# Patient Record
Sex: Male | Born: 1970 | Race: White | Hispanic: No | Smoking: Former smoker
Health system: Southern US, Community
[De-identification: ages and names within clinical notes are randomized; demographics above are authoritative.]

## PROBLEM LIST (undated history)

## (undated) DIAGNOSIS — F329 Major depressive disorder, single episode, unspecified: Secondary | ICD-10-CM

## (undated) DIAGNOSIS — F419 Anxiety disorder, unspecified: Secondary | ICD-10-CM

## (undated) DIAGNOSIS — G8929 Other chronic pain: Secondary | ICD-10-CM

## (undated) DIAGNOSIS — I10 Essential (primary) hypertension: Secondary | ICD-10-CM

## (undated) DIAGNOSIS — M1990 Unspecified osteoarthritis, unspecified site: Secondary | ICD-10-CM

## (undated) DIAGNOSIS — M549 Dorsalgia, unspecified: Secondary | ICD-10-CM

## (undated) DIAGNOSIS — T4145XA Adverse effect of unspecified anesthetic, initial encounter: Secondary | ICD-10-CM

## (undated) DIAGNOSIS — E785 Hyperlipidemia, unspecified: Secondary | ICD-10-CM

## (undated) DIAGNOSIS — F32A Depression, unspecified: Secondary | ICD-10-CM

## (undated) DIAGNOSIS — T8859XA Other complications of anesthesia, initial encounter: Secondary | ICD-10-CM

## (undated) DIAGNOSIS — I499 Cardiac arrhythmia, unspecified: Secondary | ICD-10-CM

## (undated) DIAGNOSIS — K859 Acute pancreatitis without necrosis or infection, unspecified: Secondary | ICD-10-CM

## (undated) DIAGNOSIS — J189 Pneumonia, unspecified organism: Secondary | ICD-10-CM

## (undated) DIAGNOSIS — D759 Disease of blood and blood-forming organs, unspecified: Secondary | ICD-10-CM

## (undated) DIAGNOSIS — K219 Gastro-esophageal reflux disease without esophagitis: Secondary | ICD-10-CM

## (undated) DIAGNOSIS — D693 Immune thrombocytopenic purpura: Secondary | ICD-10-CM

## (undated) DIAGNOSIS — G4730 Sleep apnea, unspecified: Secondary | ICD-10-CM

## (undated) HISTORY — PX: BACK SURGERY: SHX140

## (undated) HISTORY — DX: Essential (primary) hypertension: I10

## (undated) HISTORY — PX: HERNIA REPAIR: SHX51

## (undated) HISTORY — PX: TONSILLECTOMY: SUR1361

## (undated) HISTORY — PX: APPENDECTOMY: SHX54

## (undated) HISTORY — PX: CHOLECYSTECTOMY: SHX55

## (undated) HISTORY — PX: DIAGNOSTIC LAPAROSCOPY: SUR761

## (undated) HISTORY — PX: VASECTOMY: SHX75

---

## 1996-08-08 HISTORY — PX: REFRACTIVE SURGERY: SHX103

## 2009-02-03 ENCOUNTER — Encounter: Admission: RE | Admit: 2009-02-03 | Discharge: 2009-02-03 | Payer: Self-pay | Admitting: Neurological Surgery

## 2009-06-25 ENCOUNTER — Ambulatory Visit: Payer: Self-pay | Admitting: Vascular Surgery

## 2009-08-19 ENCOUNTER — Ambulatory Visit: Payer: Self-pay | Admitting: Vascular Surgery

## 2009-08-19 ENCOUNTER — Inpatient Hospital Stay (HOSPITAL_COMMUNITY): Admission: RE | Admit: 2009-08-19 | Discharge: 2009-08-21 | Payer: Self-pay | Admitting: Neurological Surgery

## 2009-09-01 ENCOUNTER — Encounter: Admission: RE | Admit: 2009-09-01 | Discharge: 2009-09-01 | Payer: Self-pay | Admitting: Neurological Surgery

## 2009-11-10 ENCOUNTER — Encounter: Admission: RE | Admit: 2009-11-10 | Discharge: 2009-11-10 | Payer: Self-pay | Admitting: Neurological Surgery

## 2009-12-22 ENCOUNTER — Encounter: Admission: RE | Admit: 2009-12-22 | Discharge: 2009-12-22 | Payer: Self-pay | Admitting: Neurological Surgery

## 2010-04-05 ENCOUNTER — Encounter: Admission: RE | Admit: 2010-04-05 | Discharge: 2010-04-05 | Payer: Self-pay | Admitting: Neurological Surgery

## 2010-04-08 ENCOUNTER — Encounter: Admission: RE | Admit: 2010-04-08 | Discharge: 2010-04-08 | Payer: Self-pay | Admitting: Neurological Surgery

## 2010-08-29 ENCOUNTER — Encounter: Payer: Self-pay | Admitting: Neurological Surgery

## 2010-08-30 ENCOUNTER — Encounter: Payer: Self-pay | Admitting: Neurological Surgery

## 2010-10-24 LAB — TYPE AND SCREEN

## 2010-10-24 LAB — COMPREHENSIVE METABOLIC PANEL
ALT: 21 U/L (ref 0–53)
Alkaline Phosphatase: 92 U/L (ref 39–117)
BUN: 12 mg/dL (ref 6–23)
CO2: 25 mEq/L (ref 19–32)
Calcium: 9.9 mg/dL (ref 8.4–10.5)
Chloride: 105 mEq/L (ref 96–112)
Creatinine, Ser: 0.98 mg/dL (ref 0.4–1.5)
GFR calc Af Amer: >60 mL/min (ref >60)
GFR calc non Af Amer: >60 mL/min (ref >60)
Glucose, Bld: 93 mg/dL (ref 70–99)
Potassium: 4.2 mEq/L (ref 3.5–5.1)

## 2010-10-24 LAB — APTT: aPTT: 29 seconds (ref 24–37)

## 2010-10-24 LAB — DIFFERENTIAL
Eosinophils Absolute: 0.2 10*3/uL (ref 0.0–0.7)
Lymphs Abs: 3.8 10*3/uL (ref 0.7–4.0)
Neutro Abs: 5.3 10*3/uL (ref 1.7–7.7)

## 2010-10-24 LAB — CBC
MCHC: 35.2 g/dL (ref 30.0–36.0)
Platelets: 415 10*3/uL — ABNORMAL HIGH (ref 150–400)

## 2010-10-24 LAB — PROTIME-INR: INR: 1.06 (ref 0.00–1.49)

## 2010-10-24 LAB — ABO/RH: ABO/RH(D): B NEG

## 2010-12-20 ENCOUNTER — Ambulatory Visit
Admission: RE | Admit: 2010-12-20 | Discharge: 2010-12-20 | Disposition: A | Discharge status: Home / Self Care | Payer: BC Managed Care – PPO | Source: Ambulatory Visit | Attending: Neurological Surgery | Admitting: Neurological Surgery

## 2010-12-20 ENCOUNTER — Other Ambulatory Visit: Payer: Self-pay | Admitting: Neurological Surgery

## 2010-12-20 DIAGNOSIS — M48061 Spinal stenosis, lumbar region without neurogenic claudication: Secondary | ICD-10-CM

## 2010-12-20 DIAGNOSIS — IMO0002 Reserved for concepts with insufficient information to code with codable children: Secondary | ICD-10-CM

## 2010-12-21 NOTE — Consult Note (Signed)
VASCULAR SURGERY CONSULTATION   [name], [name]  DOB:  05/29/1971                                       06/25/2009  ZOXWR#:[id number]   I saw the patient in the office today to evaluate him for possible  anterior exposure of the L5-S1 disk for anterior lumbar interbody  fusion.  This is a pleasant 40 year old gentleman who injured his back  back in 1999.  He has had intermittent low back pain since that time  although over the last 18 months his symptoms have progressed  significantly.  His pain is aggravated by standing and alleviated by  sitting or elevating his legs.  There are no other aggravating or  alleviating factors.  He does have some radiation of his pain down the  posterior aspect of his lower extremities.  He has had some relief with  pain medication but continues to have significant low back pain.   The patient was being considered for either an anterior or posterior  approach for L5-S1 fusion and I was asked to evaluate the patient for  possible anterior exposure.   PAST MEDICAL HISTORY:  The patient's past medical history is significant  for hypertension and hypercholesterolemia which are both currently well-  controlled on his current medications.  He denies any history of  diabetes, history of previous myocardial infarction, history of  congestive heart failure or history of COPD.   PAST SURGICAL HISTORY:  Significant for previous appendectomy in 1975.  He had some scar tissue from his appendectomy incision which was a  midline incision and this was worked on in 1980.  He also had a left  inguinal hernia repair in 2000.   FAMILY HISTORY:  There is no history of premature cardiovascular  disease.   SOCIAL HISTORY:  He is married.  He has 3 children.  He works as a  Paediatric nurse.  He smokes a half to three quarters of a pack per day of  cigarettes.  He had quit for some time but recently started because he  has been having so much problem with his  back.   MEDICATIONS:  Are documented on the medical history form in his chart.   ALLERGIES:  Of note he is allergic to Wellbutrin and Nexium.   REVIEW OF SYSTEMS:  He has had an approximately 30 pound weight loss  over the last several months intentionally.  He has had no fever or  chills.  He is 257 pounds, 6 feet 1 inch tall.  CARDIAC:  He has had a history of atrial fibrillation in the past but  this was only for briefly.  He has had no further problems with this.  He has had no chest pain or chest pressure.  Pulmonary, GI, GU, vascular, neurologic, musculoskeletal, psychiatric,  ENT and hematologic review of systems is unremarkable and is documented  on the medical history form in his chart.   PHYSICAL EXAMINATION:  General:  This is a pleasant 40 year old  gentleman who appears his stated age.  Vital signs:  His blood pressure  is 115/75, heart rate is 75, temperature is 98.4.  HEENT:  Extraocular  motions are intact.  Conjunctivae are normal.  There is no facial  asymmetry.  Neck:  Supple.  There is no jugular venous distention and no  cervical lymphadenopathy.  Lungs:  Are clear bilaterally to auscultation  without rales, rhonchi or wheezing.  Cardiovascular:  I do not detect  any carotid bruits.  He has a regular rate and rhythm without murmur  appreciated.  He has no significant peripheral edema.  He has palpable  femoral, posterior tibial and dorsalis pedis pulses bilaterally.  Abdomen:  Moderately obese with a healed midline incision and a healed  left inguinal incision.  He has normal pitched bowel sounds.  Musculoskeletal:  There are no major deformities or cyanosis.  Neurological:  There is no focal weakness or paresthesias.  Skin:  There  are no ulcers or rashes.   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  I also  reviewed his diskogram which was positive at L5-S1 and essentially  normal at L3-L4 and L4-L5.  Post diskography CT of the lumbar spine   shows mild retro L5 vertebral sublux with some abutment of the right S1  nerve root and mild narrowing of the right foramen.   Although he has had previous abdominal surgery I think he would be a  reasonable candidate for anterior exposure for ALIF.  I think we would  be above the level of his hernia repair and also most of the scar tissue  from his appendectomy should not be an issue.  He is moderately obese  which does make it slightly more challenging but I do not think this is  prohibitive.  We have discussed the procedure and the potential  complications including but not limited to bleeding, arterial venous  injury, arterial venous thrombosis, nerve injury, leg swelling and the  small risk of sexual dysfunction.  All of his questions were answered  and he is scheduled to meet with Dr. Yetta Barre again.  From my standpoint,  however, I think he is a reasonable candidate for anterior exposure for  ALIF and would be happy to assist if this is what ultimately the patient  and Dr. Yetta Barre decide.   Di Kindle. Edilia Bo, M.D.  Electronically Signed  CSD/MEDQ  D:  06/25/2009  T:  06/26/2009  Job:  [id number]   cc:   Tia Alert, MD  Callie Fielding, M.D.  Dr Keturah Barre

## 2011-02-23 ENCOUNTER — Encounter (HOSPITAL_COMMUNITY)
Admission: RE | Admit: 2011-02-23 | Discharge: 2011-02-23 | Disposition: A | Discharge status: Home / Self Care | Payer: BC Managed Care – PPO | Source: Ambulatory Visit | Attending: Neurological Surgery | Admitting: Neurological Surgery

## 2011-02-23 ENCOUNTER — Other Ambulatory Visit (HOSPITAL_COMMUNITY): Payer: Self-pay | Admitting: Neurological Surgery

## 2011-02-23 DIAGNOSIS — Z01811 Encounter for preprocedural respiratory examination: Secondary | ICD-10-CM

## 2011-02-23 LAB — PROTIME-INR
INR: 1.11 (ref 0.00–1.49)
Prothrombin Time: 14.5 seconds (ref 11.6–15.2)

## 2011-02-23 LAB — CBC
MCH: 30.9 pg (ref 26.0–34.0)
MCHC: 35.7 g/dL (ref 30.0–36.0)
RDW: 12.8 % (ref 11.5–15.5)
WBC: 10.1 10*3/uL (ref 4.0–10.5)

## 2011-02-23 LAB — BASIC METABOLIC PANEL
Chloride: 106 mEq/L (ref 96–112)
GFR calc Af Amer: >60 mL/min (ref >60)
GFR calc non Af Amer: >60 mL/min (ref >60)
Potassium: 4.3 mEq/L (ref 3.5–5.1)

## 2011-02-23 LAB — SURGICAL PCR SCREEN: MRSA, PCR: NEGATIVE

## 2011-02-23 LAB — TYPE AND SCREEN: Antibody Screen: NEGATIVE

## 2011-02-24 ENCOUNTER — Inpatient Hospital Stay (HOSPITAL_COMMUNITY): Payer: BC Managed Care – PPO

## 2011-02-24 ENCOUNTER — Inpatient Hospital Stay (HOSPITAL_COMMUNITY)
Admission: RE | Admit: 2011-02-24 | Discharge: 2011-02-25 | DRG: 756 | Disposition: A | Discharge status: Home / Self Care | Payer: BC Managed Care – PPO | Source: Ambulatory Visit | Attending: Neurological Surgery | Admitting: Neurological Surgery

## 2011-02-24 DIAGNOSIS — M51379 Other intervertebral disc degeneration, lumbosacral region without mention of lumbar back pain or lower extremity pain: Principal | ICD-10-CM | POA: Diagnosis present

## 2011-02-24 DIAGNOSIS — F17200 Nicotine dependence, unspecified, uncomplicated: Secondary | ICD-10-CM | POA: Diagnosis present

## 2011-02-24 DIAGNOSIS — M5137 Other intervertebral disc degeneration, lumbosacral region: Principal | ICD-10-CM | POA: Diagnosis present

## 2011-02-24 DIAGNOSIS — I10 Essential (primary) hypertension: Secondary | ICD-10-CM | POA: Diagnosis present

## 2011-02-24 DIAGNOSIS — G4733 Obstructive sleep apnea (adult) (pediatric): Secondary | ICD-10-CM | POA: Diagnosis present

## 2011-02-24 DIAGNOSIS — K219 Gastro-esophageal reflux disease without esophagitis: Secondary | ICD-10-CM | POA: Diagnosis present

## 2011-02-24 DIAGNOSIS — E669 Obesity, unspecified: Secondary | ICD-10-CM | POA: Diagnosis present

## 2011-03-03 ENCOUNTER — Ambulatory Visit (INDEPENDENT_AMBULATORY_CARE_PROVIDER_SITE_OTHER): Payer: BC Managed Care – PPO | Admitting: General Surgery

## 2011-03-04 NOTE — Op Note (Signed)
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  PROCEDURE: 1. Anterolateral retroperitoneal interbody fusion L4-5 utilizing a 12     x 22 x 55 mm Peak interbody cage packed with Osteocel Plus and     Actifuse putty. 2. Posterior interlaminar arthrodesis L4-S1 utilizing Actifuse putty     and Osteocel Plus. 3. Posterior nonsegmental fixation utilizing a large fix plate.  SURGEON:  Tia Alert, MD  ASSISTANT:  Reinaldo Meeker, MD  ANESTHESIA:  General endotracheal.  COMPLICATIONS:  None apparent.  INDICATIONS FOR PROCEDURE:  Mr. [name] is a 39 year old gentleman who presented with severe back pain.  He had undergone a previous L5-S1 ALIF.  He had return of his pain.  He had a provocative diskogram which suggested severe concordant pain at L4-5 with a normal controls.  I felt this made him a candidate for lumbar fusion at L4-5.  I recommended anterolateral retroperitoneal interbody fusion and hopes of improving his pain syndrome.  He understood the risks, benefits, unexpected outcome, and wished to proceed.  DESCRIPTION OF PROCEDURE:  The patient was taken to operating room. After induction of adequate generalized endotracheal anesthesia, he was placed in the right lateral decubitus position exposing his left side and taped into position and typical anterolateral retroperitoneal interbody fusion in position.  He was positioned utilizing AP and lateral fluoroscopy.  Lateral incision was marked utilizing lateral fluoroscopy.  The skin was  then cleaned with Hibiclens and prepped with DuraPrep and then draped in usual sterile fashion.  A 5 mL of local anesthesia injected and a direct lateral incision was made over the L4-5 interspace.  An incision was made posterior lateral to this and blunt finger dissection was used to enter the retroperitoneal space at the L3- 4 and L4-5 level.  The psoas musculature was palpable.  I could feel the rib end and inside of the iliac crest.  I could feel the psoas musculature as well as the anterior face of the transverse process and I swept my finger to the lateral incision and passed my first dilator down to the psoas muscle.  We checked our twitch test and then checked lateral fluoroscopy, we were over the interspace at L4-5.  We checked EMG monitoring to make sure we were not close to neural structures and placed our K-wire.  We then used sequential dilation until our final retractor was in place.  We then opened the retractor, checked with AP and lateral fluoroscopy.  Fluoroscopy used, ball probe to assure no neural structures in the surgical bed and then placed our intradiscal shim.  Once this was in place, we able to open the retractor further, use of all probe once again.  We incised the disk  space and performed initial diskectomy with pituitary rongeurs.  I then used Cobb curettes to release disk from the endplates and released the opposite annulus. Once this was done, I used scrapers and shavers to prepare the endplates for later arthrodesis.  I then passed 60-mm paddle across the endplate open the opposite annulus further and then used sequential trials.  The 12-mm lordotic trial fit the best.  Therefore, we picked a 12 lordotic x 22 mm x 55 mm Peak interbody cage in packed this with Osteocel Plus and Actifuse putty and then used sliders to tap it across the interspace utilizing AP fluoroscopy.  I then checked my final construct with AP and lateral fluoroscopy and irrigated with  saline solution containing bacitracin.  I removed the retractor and checking for any bleeding and then closed the incisions with 0 Vicryl in the fascia, 2-0 Vicryl subcutaneous tissues, 3-0 Vicryl subcuticular tissues, and Dermabond on the skin.  The patient was then repositioned on chest rolls into the prone position.  His lumbar region was prepped with DuraPrep and then draped in usual sterile fashion.  A 5 mL local anesthesia injected, a dorsal midline incision and carried down to the fascia.  The fascia was opened and the paraspinous musculature taken down subperiosteal fashion to expose L4-5 and L5-S1.  Intraoperative fluoroscopy confirmed my level, I removed the interspinous ligament at L4-5.  I measured to be a large plate.  I drilled the lamina of L4-5 and S1, and then placed a mixture of Osteocel Plus and Actifuse putty out over these.  I performed posterior laminar arthrodesis and then placed my large plate at Z6-1, squeeze it into position.  I then checked my final construct, the AP and lateral fluoroscopy, irrigated with saline solution containing bacitracin, placed a medium Hemovac drain through a separate stab incision and closed the fascia with 0 Vicryl and subcuticular tissue with 3-0 Vicryl, closed the skin with Benzoin and Steri-Strips.  The drapes were removed.  Sterile dressing was applied.  The patient was awakened from general anesthesia and transferred to recovery room in stable condition.  At the end of procedure, all sponge, needle, and sponge counts were correct.     Tia Alert, MD     DSJ/MEDQ  D:  02/24/2011  T:  02/25/2011  Job:  [id number]  Electronically Signed by David Alar MD on 03/04/2011 11:36:31 AM

## 2011-03-28 ENCOUNTER — Other Ambulatory Visit: Payer: Self-pay | Admitting: Neurological Surgery

## 2011-03-28 ENCOUNTER — Ambulatory Visit
Admission: RE | Admit: 2011-03-28 | Discharge: 2011-03-28 | Disposition: A | Discharge status: Home / Self Care | Payer: BC Managed Care – PPO | Source: Ambulatory Visit | Attending: Neurological Surgery | Admitting: Neurological Surgery

## 2011-03-28 DIAGNOSIS — M549 Dorsalgia, unspecified: Secondary | ICD-10-CM

## 2011-07-12 ENCOUNTER — Ambulatory Visit
Admission: RE | Admit: 2011-07-12 | Discharge: 2011-07-12 | Disposition: A | Discharge status: Home / Self Care | Payer: BC Managed Care – PPO | Source: Ambulatory Visit | Attending: Neurological Surgery | Admitting: Neurological Surgery

## 2011-07-12 ENCOUNTER — Other Ambulatory Visit: Payer: Self-pay | Admitting: Neurological Surgery

## 2011-07-12 DIAGNOSIS — M545 Low back pain, unspecified: Secondary | ICD-10-CM

## 2011-07-12 DIAGNOSIS — M79604 Pain in right leg: Secondary | ICD-10-CM

## 2011-12-20 ENCOUNTER — Other Ambulatory Visit: Payer: Self-pay | Admitting: Physician Assistant

## 2011-12-20 ENCOUNTER — Ambulatory Visit
Admission: RE | Admit: 2011-12-20 | Discharge: 2011-12-20 | Disposition: A | Discharge status: Home / Self Care | Payer: BC Managed Care – PPO | Source: Ambulatory Visit | Attending: Physician Assistant | Admitting: Physician Assistant

## 2011-12-20 DIAGNOSIS — R52 Pain, unspecified: Secondary | ICD-10-CM

## 2012-07-12 ENCOUNTER — Encounter (HOSPITAL_COMMUNITY): Payer: Self-pay | Admitting: Pharmacy Technician

## 2012-07-16 NOTE — H&P (Addendum)
History of Present Illness The patient is a 41 year old male who presents with back pain. The patient is here today in referral from Dr. Vear Clock. The patient reports low back symptoms including pain which began 5 year(s) ago following a specific injury. The injury occurred due to lifting (lifting and pulling out of the ground an object with low back pain (1998)) and bending. and Symptoms include pain (across the lower lumbar region radiating into bilat. lower ext. with left greater than the right, to the level of the feet. ), numbness (bilat. lower ext. ) and weakness (left greater than right ), while symptoms do not include incontinence of stool or incontinence of urine. Current treatment includes nonsteroidal anti-inflammatory drugs (Celebrex 200mg  bid ), opioid analgesics (Oxycodone 20mg , Lyrica 150mg  tid), muscle relaxants (Robaxin 750mg  as well as Valium ), activity modification, heating pad and TENS unit. Prior to being seen today the patient was previously evaluated Dr. Yetta Barre Neurosurgeon as well as Dr. Vear Clock . Past evaluation has included x-ray of the lumbar spine, CT of the lumbar spine, MRI of the lumbar spine, neurosurgical evaluation and pain management evaluation. Past treatment has included opioid analgesics, muscle relaxants, epidural injections and back surgery (X2).    Subjective Transcription  [name]'s a very pleasant, young man, who has been having chronic back pain. He has a diagnosis of failed back syndrome. He's had 2 fusions in the past and, despite this, he continues to have severe, disabling pain. Dr. Vear Clock had attempted to do a spinal cord stimulator, but he was unable, because of scar tissue to pass the leads. As a result of the failure of the trial, he was sent to me to determine if we can do a permanent implant trial.    Allergies NexIUM *ULCER DRUGS* Wellbutrin *ANTIDEPRESSANTS*   Family History Congestive Heart Failure. mother Diabetes  Mellitus. mother Heart Disease. mother Heart disease in male family member before age 7 Hypertension. mother and father   Social History Alcohol use. never consumed alcohol Children. 3 Current work status. unemployed Drug/Alcohol Rehab (Currently). no Drug/Alcohol Rehab (Previously). no Exercise. Exercises daily; does running / walking Illicit drug use. no Living situation. live with spouse Marital status. married Number of flights of stairs before winded. 2-3 Pain Contract. yes Tobacco / smoke exposure. no Tobacco use. former smoker   Medication History Robaxin-750 ( Oral) Specific dose unknown - Active. Lyrica ( Oral) Specific dose unknown - Active. OxyCODONE HCl ( Oral) Specific dose unknown - Active. CeleBREX ( Oral) Specific dose unknown - Active. Verelan PM ( Oral) Specific dose unknown - Active. PROzac ( Oral) Specific dose unknown - Active. Lisinopril ( Oral) Specific dose unknown - Active. Tricor ( Oral) Specific dose unknown - Active. Livalo ( Oral) Specific dose unknown - Active.   Past Surgical History Appendectomy Gallbladder Surgery. laporoscopic Inguinal Hernia Repair. open: left Spinal Fusion. lower back Spinal Surgery Tonsillectomy Vasectomy   Other Problems Chronic Pain Depression Gastroesophageal Reflux Disease High blood pressure Hypercholesterolemia   Review of Systems General:Not Present- Chills, Fever, Night Sweats, Appetite Loss, Fatigue, Feeling sick, Weight Gain and Weight Loss. Skin:Not Present- Itching, Rash, Skin Color Changes, Ulcer, Psoriasis and Change in Hair or Nails. HEENT:Present- Ringing in the Ears. Not Present- Sensitivity to light, Hearing problems and Nose Bleed. Neck:Not Present- Swollen Glands and Neck Mass. Respiratory:Not Present- Snoring, Chronic Cough, Bloody sputum and Dyspnea. Cardiovascular:Not Present- Shortness of Breath, Chest Pain, Swelling of Extremities, Leg Cramps  and Palpitations. Gastrointestinal:Present- Heartburn. Not Present- Bloody Stool, Abdominal  Pain, Vomiting, Nausea and Incontinence of Stool. Male Genitourinary:Not Present- Blood in Urine, Frequency, Incontinence and Nocturia. Musculoskeletal:Present- Muscle Weakness, Muscle Pain, Joint Stiffness, Joint Swelling, Joint Pain and Back Pain. Neurological:Present- Tingling, Numbness and Burning. Not Present- Tremor, Headaches and Dizziness. Psychiatric:Present- Depression. Not Present- Anxiety and Memory Loss. Hematology:Not Present- Abnormal Bleeding, Anemia, Blood Clots and Easy Bruising.   Vitals 06/29/2012 2:33 PM Weight: 310 lb Height: 72 in Body Surface Area: 2.67 m Body Mass Index: 42.04 kg/m Pulse: 76 (Regular) BP: 131/78 (Sitting, Left Arm, Standard)    Objective Transcription  On clinical examination, he's a pleasant gentleman, who appears his stated age. He's alert. He's oriented times 3. No shortness of breath or chest pain.    LUNGS: Lung fields are clear to auscultation.  HEART: Regular rate and rhythm.  ABDOMEN: Soft and nontender.  GU: No incontinence of bowel or bladder.    He has 5/5 strength in his lower extremity. No focal motor deficits. He does have bilateral numbness and dysesthesias in both extremities. No hip, knee or ankle pain with joint ROM. He ambulates with a cane. Compartments are soft and nontender. Intact peripheral pulses.    Assessment & Plan Pain, Lumbar (LBP) (724.2)  Chronic pain syndrome (338.4) Current Plans l MRI Thoracic Spine (40981) (STAT; Thoracic MRI eval. for SCS placement ; Not chlaus.; No metal)   Plans Transcription  At this point in time, I do think it is reasonable to do an open trial. This would entail making an incision at the lower thoracic spine, creating a laminotomy defect, and advancing a paddle and securing it to the spinous process. I would then advance it  down to the mid lumbar region and then make a separate incision. In that incision, I would bury the wire after connecting it to an extension and then advance the extensions out from the skin to connect to his trial battery. If this operation is successful in controlling and reducing his pain, I would then go back in, remove the extension and then advance the original electrodes down to the lower gluteal region and connect it to a battery and use the battery as a permanent implant. Another option is to just do a straightforward spinal cord stimulator implant with the battery. I will check with his insurance company to see which they would require.    At this point, I've explained the risks which include infection, bleeding nerve damage, death, stroke, paralysis, failure to heal, ongoing or worse pain, migration of the leads. All of the patient's and the wife's questions were addressed. They were present for the dictation. I will need to get a STAT thoracic MRI to ensure that there is adequate room to accommodate the implant. I will get that. We will also get preoperative medical clearance from his PCP. Once I have this information, we will plan on proceeding in a very timely fashion. The patient indicated because of financial reasons he would like to get all this done before the end of the year which I think is possible.      Venita Lick, M. D./slk    fx: Kathrin Penner. Katheran Awe. D./slk  fx: Keturah Barre, M. D./slk   Add: patient was cleared for surgery by PCP Dr Keturah Barre

## 2012-07-17 ENCOUNTER — Encounter (HOSPITAL_COMMUNITY)
Admission: RE | Admit: 2012-07-17 | Discharge: 2012-07-17 | Disposition: A | Discharge status: Home / Self Care | Payer: BC Managed Care – PPO | Source: Ambulatory Visit | Attending: Orthopedic Surgery | Admitting: Orthopedic Surgery

## 2012-07-17 ENCOUNTER — Encounter (HOSPITAL_COMMUNITY): Payer: Self-pay

## 2012-07-17 ENCOUNTER — Ambulatory Visit (HOSPITAL_COMMUNITY)
Admission: RE | Admit: 2012-07-17 | Discharge: 2012-07-17 | Disposition: A | Discharge status: Home / Self Care | Payer: BC Managed Care – PPO | Source: Ambulatory Visit | Attending: Anesthesiology | Admitting: Anesthesiology

## 2012-07-17 DIAGNOSIS — Z01818 Encounter for other preprocedural examination: Secondary | ICD-10-CM | POA: Insufficient documentation

## 2012-07-17 DIAGNOSIS — I10 Essential (primary) hypertension: Secondary | ICD-10-CM | POA: Insufficient documentation

## 2012-07-17 HISTORY — DX: Sleep apnea, unspecified: G47.30

## 2012-07-17 HISTORY — DX: Pneumonia, unspecified organism: J18.9

## 2012-07-17 HISTORY — DX: Acute pancreatitis without necrosis or infection, unspecified: K85.90

## 2012-07-17 HISTORY — DX: Depression, unspecified: F32.A

## 2012-07-17 HISTORY — DX: Gastro-esophageal reflux disease without esophagitis: K21.9

## 2012-07-17 HISTORY — DX: Hyperlipidemia, unspecified: E78.5

## 2012-07-17 HISTORY — DX: Essential (primary) hypertension: I10

## 2012-07-17 HISTORY — DX: Cardiac arrhythmia, unspecified: I49.9

## 2012-07-17 HISTORY — DX: Unspecified osteoarthritis, unspecified site: M19.90

## 2012-07-17 HISTORY — DX: Major depressive disorder, single episode, unspecified: F32.9

## 2012-07-17 LAB — SURGICAL PCR SCREEN: Staphylococcus aureus: NEGATIVE

## 2012-07-17 LAB — CBC
HCT: 44.1 % (ref 39.0–52.0)
MCHC: 36.5 g/dL — ABNORMAL HIGH (ref 30.0–36.0)
RDW: 12.6 % (ref 11.5–15.5)

## 2012-07-17 LAB — BASIC METABOLIC PANEL
BUN: 12 mg/dL (ref 6–23)
Chloride: 100 mEq/L (ref 96–112)
Creatinine, Ser: 0.83 mg/dL (ref 0.50–1.35)
GFR calc Af Amer: >90 mL/min (ref >90)

## 2012-07-17 NOTE — Progress Notes (Signed)
Cardiac records and sleep study received and discussed with Revonda Standard, ok for surgery.

## 2012-07-17 NOTE — Pre-Procedure Instructions (Addendum)
20 [name]  07/17/2012   Your procedure is scheduled on:  Wednesday July 18, 2012  Report to [facility] at 11:30 AM.  Call this number if you have problems the morning of surgery: [phone]   Remember:   Do not eat food or drink :After Midnight.    Take these medicines the morning of surgery with A SIP OF WATER: valium, oxycodone, lyrica,    Do not wear jewelry, make-up or nail polish.  Do not wear lotions, powders, or perfumes.   Do not shave 48 hours prior to surgery. Men may shave face and neck.  Do not bring valuables to the hospital.  Contacts, dentures or bridgework may not be worn into surgery.  Leave suitcase in the car. After surgery it may be brought to your room.  For patients admitted to the hospital, checkout time is 11:00 AM the day of discharge.   Patients discharged the day of surgery will not be allowed to drive home.  Name and phone number of your driver: family / friend  Special Instructions: Shower using CHG 2 nights before surgery and the night before surgery.  If you shower the day of surgery use CHG.  Use special wash - you have one bottle of CHG for all showers.  You should use approximately 1/3 of the bottle for each shower.   Please read over the following fact sheets that you were given: Pain Booklet, Coughing and Deep Breathing, MRSA Information and Surgical Site Infection Prevention

## 2012-07-17 NOTE — Progress Notes (Signed)
Contacted Dr. Hulen Shouts office, spoke with Hansel Starling, requested last office visit note, stress/echo, and EKG. Faxed request to [facility] medical records requesting sleep study.

## 2012-07-18 ENCOUNTER — Ambulatory Visit (HOSPITAL_COMMUNITY): Payer: BC Managed Care – PPO | Admitting: Anesthesiology

## 2012-07-18 ENCOUNTER — Ambulatory Visit (HOSPITAL_COMMUNITY)
Admission: RE | Admit: 2012-07-18 | Discharge: 2012-07-19 | Disposition: A | Discharge status: Home / Self Care | Payer: BC Managed Care – PPO | Source: Ambulatory Visit | Attending: Orthopedic Surgery | Admitting: Orthopedic Surgery

## 2012-07-18 ENCOUNTER — Encounter (HOSPITAL_COMMUNITY)
Admission: RE | Disposition: A | Discharge status: Home / Self Care | Payer: Self-pay | Source: Ambulatory Visit | Attending: Orthopedic Surgery

## 2012-07-18 ENCOUNTER — Encounter (HOSPITAL_COMMUNITY): Payer: Self-pay | Admitting: Anesthesiology

## 2012-07-18 ENCOUNTER — Ambulatory Visit (HOSPITAL_COMMUNITY): Payer: BC Managed Care – PPO

## 2012-07-18 ENCOUNTER — Encounter (HOSPITAL_COMMUNITY): Payer: Self-pay | Admitting: *Deleted

## 2012-07-18 DIAGNOSIS — Z01818 Encounter for other preprocedural examination: Secondary | ICD-10-CM | POA: Insufficient documentation

## 2012-07-18 DIAGNOSIS — Z79899 Other long term (current) drug therapy: Secondary | ICD-10-CM | POA: Insufficient documentation

## 2012-07-18 DIAGNOSIS — G4730 Sleep apnea, unspecified: Secondary | ICD-10-CM | POA: Insufficient documentation

## 2012-07-18 DIAGNOSIS — G8929 Other chronic pain: Secondary | ICD-10-CM | POA: Insufficient documentation

## 2012-07-18 DIAGNOSIS — Z01810 Encounter for preprocedural cardiovascular examination: Secondary | ICD-10-CM | POA: Insufficient documentation

## 2012-07-18 DIAGNOSIS — F3289 Other specified depressive episodes: Secondary | ICD-10-CM | POA: Insufficient documentation

## 2012-07-18 DIAGNOSIS — I4891 Unspecified atrial fibrillation: Secondary | ICD-10-CM | POA: Insufficient documentation

## 2012-07-18 DIAGNOSIS — M549 Dorsalgia, unspecified: Secondary | ICD-10-CM | POA: Insufficient documentation

## 2012-07-18 DIAGNOSIS — K219 Gastro-esophageal reflux disease without esophagitis: Secondary | ICD-10-CM | POA: Insufficient documentation

## 2012-07-18 DIAGNOSIS — Z981 Arthrodesis status: Secondary | ICD-10-CM | POA: Insufficient documentation

## 2012-07-18 DIAGNOSIS — E785 Hyperlipidemia, unspecified: Secondary | ICD-10-CM | POA: Insufficient documentation

## 2012-07-18 DIAGNOSIS — I10 Essential (primary) hypertension: Secondary | ICD-10-CM | POA: Insufficient documentation

## 2012-07-18 DIAGNOSIS — F329 Major depressive disorder, single episode, unspecified: Secondary | ICD-10-CM | POA: Insufficient documentation

## 2012-07-18 DIAGNOSIS — Z01812 Encounter for preprocedural laboratory examination: Secondary | ICD-10-CM | POA: Insufficient documentation

## 2012-07-18 HISTORY — PX: SPINAL CORD STIMULATOR INSERTION: SHX5378

## 2012-07-18 HISTORY — PX: SPINAL CORD STIMULATOR IMPLANT: SHX2422

## 2012-07-18 SURGERY — INSERTION, SPINAL CORD STIMULATOR, LUMBAR
Anesthesia: General | Site: Back | Wound class: Clean

## 2012-07-18 MED ORDER — PREGABALIN 50 MG PO CAPS
150.0000 mg | ORAL_CAPSULE | Freq: Three times a day (TID) | ORAL | Status: DC
  Administered 2012-07-18 – 2012-07-19 (×2): 150 mg via ORAL
  Filled 2012-07-18 (×2): qty 3

## 2012-07-18 MED ORDER — DEXAMETHASONE SODIUM PHOSPHATE 4 MG/ML IJ SOLN
4.0000 mg | Freq: Four times a day (QID) | INTRAMUSCULAR | Status: DC
  Filled 2012-07-18 (×7): qty 1

## 2012-07-18 MED ORDER — PHENYLEPHRINE HCL 10 MG/ML IJ SOLN
INTRAMUSCULAR | Status: DC | PRN
  Administered 2012-07-18: 80 ug via INTRAVENOUS
  Administered 2012-07-18 (×2): 40 ug via INTRAVENOUS
  Administered 2012-07-18 (×2): 80 ug via INTRAVENOUS
  Administered 2012-07-18: 40 ug via INTRAVENOUS
  Administered 2012-07-18: 80 ug via INTRAVENOUS

## 2012-07-18 MED ORDER — LACTATED RINGERS IV SOLN
INTRAVENOUS | Status: DC | PRN
  Administered 2012-07-18 (×3): via INTRAVENOUS

## 2012-07-18 MED ORDER — THROMBIN 20000 UNITS EX SOLR
CUTANEOUS | Status: AC
  Filled 2012-07-18: qty 20000

## 2012-07-18 MED ORDER — PHENOL 1.4 % MT LIQD: 1.0000 | OROMUCOSAL | Status: DC | PRN

## 2012-07-18 MED ORDER — SUCCINYLCHOLINE CHLORIDE 20 MG/ML IJ SOLN
INTRAMUSCULAR | Status: DC | PRN
  Administered 2012-07-18: 100 mg via INTRAVENOUS

## 2012-07-18 MED ORDER — BUPIVACAINE-EPINEPHRINE 0.25% -1:200000 IJ SOLN
INTRAMUSCULAR | Status: DC | PRN
  Administered 2012-07-18: 10 mL

## 2012-07-18 MED ORDER — PREGABALIN 75 MG PO CAPS: 150.0000 mg | ORAL_CAPSULE | Freq: Three times a day (TID) | ORAL | Status: DC

## 2012-07-18 MED ORDER — MORPHINE SULFATE 2 MG/ML IJ SOLN
1.0000 mg | INTRAMUSCULAR | Status: DC | PRN
  Administered 2012-07-18 – 2012-07-19 (×3): 2 mg via INTRAVENOUS
  Filled 2012-07-18 (×3): qty 1

## 2012-07-18 MED ORDER — THROMBIN 20000 UNITS EX SOLR
OROMUCOSAL | Status: DC | PRN
  Administered 2012-07-18: 14:00:00 via TOPICAL

## 2012-07-18 MED ORDER — OXYCODONE HCL 5 MG/5ML PO SOLN: 5.0000 mg | Freq: Once | ORAL | Status: DC | PRN

## 2012-07-18 MED ORDER — ROCURONIUM BROMIDE 100 MG/10ML IV SOLN
INTRAVENOUS | Status: DC | PRN
  Administered 2012-07-18: 40 mg via INTRAVENOUS
  Administered 2012-07-18: 10 mg via INTRAVENOUS

## 2012-07-18 MED ORDER — LACTATED RINGERS IV SOLN
INTRAVENOUS | Status: DC
  Administered 2012-07-18: 13:00:00 via INTRAVENOUS

## 2012-07-18 MED ORDER — ONDANSETRON HCL 4 MG/2ML IJ SOLN: 4.0000 mg | Freq: Four times a day (QID) | INTRAMUSCULAR | Status: DC | PRN

## 2012-07-18 MED ORDER — OXYCODONE HCL 5 MG PO TABS
ORAL_TABLET | ORAL | Status: AC
  Filled 2012-07-18: qty 4

## 2012-07-18 MED ORDER — ACETAMINOPHEN 10 MG/ML IV SOLN: 1000.0000 mg | Freq: Once | INTRAVENOUS | Status: DC

## 2012-07-18 MED ORDER — ZOLPIDEM TARTRATE 5 MG PO TABS: 5.0000 mg | ORAL_TABLET | Freq: Every evening | ORAL | Status: DC | PRN

## 2012-07-18 MED ORDER — PROPOFOL 10 MG/ML IV BOLUS
INTRAVENOUS | Status: DC | PRN
  Administered 2012-07-18: 20 mg via INTRAVENOUS
  Administered 2012-07-18: 200 mg via INTRAVENOUS
  Administered 2012-07-18: 20 mg via INTRAVENOUS

## 2012-07-18 MED ORDER — DIAZEPAM 5 MG PO TABS: 5.0000 mg | ORAL_TABLET | Freq: Two times a day (BID) | ORAL | Status: DC | PRN

## 2012-07-18 MED ORDER — SODIUM CHLORIDE 0.9 % IJ SOLN
3.0000 mL | Freq: Two times a day (BID) | INTRAMUSCULAR | Status: DC
  Administered 2012-07-18: 3 mL via INTRAVENOUS

## 2012-07-18 MED ORDER — MIDAZOLAM HCL 5 MG/5ML IJ SOLN
INTRAMUSCULAR | Status: DC | PRN
  Administered 2012-07-18 (×2): 2 mg via INTRAVENOUS

## 2012-07-18 MED ORDER — SODIUM CHLORIDE 0.9 % IJ SOLN: 3.0000 mL | INTRAMUSCULAR | Status: DC | PRN

## 2012-07-18 MED ORDER — HEMOSTATIC AGENTS (NO CHARGE) OPTIME
TOPICAL | Status: DC | PRN
  Administered 2012-07-18: 1 via TOPICAL

## 2012-07-18 MED ORDER — METHOCARBAMOL 500 MG PO TABS
ORAL_TABLET | ORAL | Status: AC
  Filled 2012-07-18: qty 2

## 2012-07-18 MED ORDER — OXYCODONE HCL 5 MG PO TABS: 5.0000 mg | ORAL_TABLET | Freq: Once | ORAL | Status: DC | PRN

## 2012-07-18 MED ORDER — VERAPAMIL HCL ER 180 MG PO TBCR
300.0000 mg | EXTENDED_RELEASE_TABLET | Freq: Every day | ORAL | Status: DC
  Administered 2012-07-18: 300 mg via ORAL
  Filled 2012-07-18 (×2): qty 1

## 2012-07-18 MED ORDER — NEOSTIGMINE METHYLSULFATE 1 MG/ML IJ SOLN
INTRAMUSCULAR | Status: DC | PRN
  Administered 2012-07-18: 3 mg via INTRAVENOUS

## 2012-07-18 MED ORDER — FENTANYL CITRATE 0.05 MG/ML IJ SOLN
INTRAMUSCULAR | Status: DC | PRN
  Administered 2012-07-18: 100 ug via INTRAVENOUS
  Administered 2012-07-18 (×2): 50 ug via INTRAVENOUS
  Administered 2012-07-18: 100 ug via INTRAVENOUS
  Administered 2012-07-18: 50 ug via INTRAVENOUS
  Administered 2012-07-18: 150 ug via INTRAVENOUS

## 2012-07-18 MED ORDER — CEFAZOLIN SODIUM 1-5 GM-% IV SOLN
1.0000 g | Freq: Three times a day (TID) | INTRAVENOUS | Status: AC
  Administered 2012-07-18 – 2012-07-19 (×2): 1 g via INTRAVENOUS
  Filled 2012-07-18 (×2): qty 50

## 2012-07-18 MED ORDER — CEFAZOLIN SODIUM-DEXTROSE 2-3 GM-% IV SOLR
INTRAVENOUS | Status: DC | PRN
  Administered 2012-07-18: 2 g via INTRAVENOUS

## 2012-07-18 MED ORDER — LACTATED RINGERS IV SOLN
INTRAVENOUS | Status: DC
  Administered 2012-07-18: 17:00:00 via INTRAVENOUS

## 2012-07-18 MED ORDER — OXYCODONE HCL 5 MG PO TABS
20.0000 mg | ORAL_TABLET | Freq: Four times a day (QID) | ORAL | Status: DC | PRN
  Administered 2012-07-18 – 2012-07-19 (×4): 20 mg via ORAL
  Filled 2012-07-18 (×3): qty 4

## 2012-07-18 MED ORDER — HYDROMORPHONE HCL PF 1 MG/ML IJ SOLN
INTRAMUSCULAR | Status: DC | PRN
  Administered 2012-07-18: 1 mg via INTRAVENOUS

## 2012-07-18 MED ORDER — CEFAZOLIN SODIUM-DEXTROSE 2-3 GM-% IV SOLR
2.0000 g | INTRAVENOUS | Status: DC
  Filled 2012-07-18: qty 50

## 2012-07-18 MED ORDER — HYDROMORPHONE HCL PF 1 MG/ML IJ SOLN: 0.2500 mg | INTRAMUSCULAR | Status: DC | PRN

## 2012-07-18 MED ORDER — METHOCARBAMOL 750 MG PO TABS
750.0000 mg | ORAL_TABLET | Freq: Three times a day (TID) | ORAL | Status: DC | PRN
  Administered 2012-07-18 – 2012-07-19 (×2): 750 mg via ORAL
  Filled 2012-07-18 (×2): qty 1

## 2012-07-18 MED ORDER — LISINOPRIL 20 MG PO TABS
20.0000 mg | ORAL_TABLET | Freq: Every day | ORAL | Status: DC
  Administered 2012-07-18: 20 mg via ORAL
  Filled 2012-07-18 (×2): qty 1

## 2012-07-18 MED ORDER — ACETAMINOPHEN 10 MG/ML IV SOLN
1000.0000 mg | Freq: Four times a day (QID) | INTRAVENOUS | Status: DC
  Administered 2012-07-18 – 2012-07-19 (×3): 1000 mg via INTRAVENOUS
  Filled 2012-07-18 (×4): qty 100

## 2012-07-18 MED ORDER — ONDANSETRON HCL 4 MG/2ML IJ SOLN
INTRAMUSCULAR | Status: DC | PRN
  Administered 2012-07-18: 4 mg via INTRAVENOUS

## 2012-07-18 MED ORDER — FLUOXETINE HCL 20 MG PO CAPS
60.0000 mg | ORAL_CAPSULE | Freq: Every day | ORAL | Status: DC
  Administered 2012-07-18: 60 mg via ORAL
  Filled 2012-07-18 (×2): qty 3

## 2012-07-18 MED ORDER — VERAPAMIL HCL ER 300 MG PO CP24: 300.0000 mg | ORAL_CAPSULE | Freq: Every day | ORAL | Status: DC

## 2012-07-18 MED ORDER — DEXAMETHASONE 4 MG PO TABS
4.0000 mg | ORAL_TABLET | Freq: Four times a day (QID) | ORAL | Status: DC
  Administered 2012-07-18 – 2012-07-19 (×3): 4 mg via ORAL
  Filled 2012-07-18 (×7): qty 1

## 2012-07-18 MED ORDER — MENTHOL 3 MG MT LOZG: 1.0000 | LOZENGE | OROMUCOSAL | Status: DC | PRN

## 2012-07-18 MED ORDER — ATORVASTATIN CALCIUM 10 MG PO TABS
10.0000 mg | ORAL_TABLET | Freq: Every day | ORAL | Status: DC
  Administered 2012-07-18: 10 mg via ORAL
  Filled 2012-07-18 (×2): qty 1

## 2012-07-18 MED ORDER — SODIUM CHLORIDE 0.9 % IV SOLN: 250.0000 mL | INTRAVENOUS | Status: DC

## 2012-07-18 MED ORDER — LIDOCAINE HCL (CARDIAC) 20 MG/ML IV SOLN
INTRAVENOUS | Status: DC | PRN
  Administered 2012-07-18: 50 mg via INTRAVENOUS

## 2012-07-18 MED ORDER — ONDANSETRON HCL 4 MG/2ML IJ SOLN: 4.0000 mg | INTRAMUSCULAR | Status: DC | PRN

## 2012-07-18 MED ORDER — BUPIVACAINE-EPINEPHRINE PF 0.25-1:200000 % IJ SOLN
INTRAMUSCULAR | Status: AC
  Filled 2012-07-18: qty 30

## 2012-07-18 MED ORDER — 0.9 % SODIUM CHLORIDE (POUR BTL) OPTIME
TOPICAL | Status: DC | PRN
  Administered 2012-07-18: 1000 mL

## 2012-07-18 MED ORDER — CEFAZOLIN SODIUM 1-5 GM-% IV SOLN
INTRAVENOUS | Status: AC
  Filled 2012-07-18: qty 50

## 2012-07-18 MED ORDER — GLYCOPYRROLATE 0.2 MG/ML IJ SOLN
INTRAMUSCULAR | Status: DC | PRN
  Administered 2012-07-18: 0.4 mg via INTRAVENOUS

## 2012-07-18 MED ORDER — FLUOXETINE HCL 60 MG PO TABS: 60.0000 mg | ORAL_TABLET | Freq: Every day | ORAL | Status: DC

## 2012-07-18 SURGICAL SUPPLY — 60 items
BAG ISOLATION DRAPE 18X18 (DRAPES) ×1 IMPLANT
CANISTER SUCTION 2500CC (MISCELLANEOUS) ×2 IMPLANT
CLOTH BEACON ORANGE TIMEOUT ST (SAFETY) ×2 IMPLANT
CORDS BIPOLAR (ELECTRODE) ×2 IMPLANT
DRAPE C-ARM 42X72 X-RAY (DRAPES) ×2 IMPLANT
DRAPE INCISE IOBAN 85X60 (DRAPES) ×2 IMPLANT
DRAPE ISOLATION BAG 18X18 (DRAPES) ×1
DRAPE SURG 17X23 STRL (DRAPES) ×2 IMPLANT
DRAPE U-SHAPE 47X51 STRL (DRAPES) ×2 IMPLANT
DRSG MEPILEX BORDER 4X4 (GAUZE/BANDAGES/DRESSINGS) IMPLANT
DRSG MEPILEX BORDER 4X8 (GAUZE/BANDAGES/DRESSINGS) ×2 IMPLANT
DRSG TEGADERM 4X4.75 (GAUZE/BANDAGES/DRESSINGS) ×2 IMPLANT
DURAPREP 26ML APPLICATOR (WOUND CARE) ×2 IMPLANT
ELECT BLADE 4.0 EZ CLEAN MEGAD (MISCELLANEOUS) ×2
ELECT CAUTERY BLADE 6.4 (BLADE) ×2 IMPLANT
ELECT REM PT RETURN 9FT ADLT (ELECTROSURGICAL) ×2
ELECTRODE BLDE 4.0 EZ CLN MEGD (MISCELLANEOUS) ×1 IMPLANT
ELECTRODE REM PT RTRN 9FT ADLT (ELECTROSURGICAL) ×1 IMPLANT
ELEVATER PASSER (SPINAL CORD STIMULATOR) ×2
EXTENSION 35CM (Spinal Cord Stimulator) ×4 IMPLANT
GLOVE BIOGEL PI IND STRL 6.5 (GLOVE) ×1 IMPLANT
GLOVE BIOGEL PI IND STRL 8.5 (GLOVE) ×1 IMPLANT
GLOVE BIOGEL PI INDICATOR 6.5 (GLOVE) ×1
GLOVE BIOGEL PI INDICATOR 8.5 (GLOVE) ×1
GLOVE ECLIPSE 6.0 STRL STRAW (GLOVE) ×2 IMPLANT
GLOVE ECLIPSE 8.5 STRL (GLOVE) ×4 IMPLANT
GOWN PREVENTION PLUS XXLARGE (GOWN DISPOSABLE) ×2 IMPLANT
GOWN STRL NON-REIN LRG LVL3 (GOWN DISPOSABLE) ×4 IMPLANT
KIT BASIN OR (CUSTOM PROCEDURE TRAY) ×2 IMPLANT
KIT ROOM TURNOVER OR (KITS) ×2 IMPLANT
LEAD ARTISAN 50CM (Spinal Cord Stimulator) ×2 IMPLANT
NDL SUT 6 .5 CRC .975X.05 MAYO (NEEDLE) ×1 IMPLANT
NEEDLE 22X1 1/2 (OR ONLY) (NEEDLE) ×2 IMPLANT
NEEDLE MAYO TAPER (NEEDLE) ×1
NEEDLE SPNL 18GX3.5 QUINCKE PK (NEEDLE) ×4 IMPLANT
NS IRRIG 1000ML POUR BTL (IV SOLUTION) ×2 IMPLANT
OR CABLE 2X8, 61CM AND EXTENSION ×4 IMPLANT
PACK LAMINECTOMY ORTHO (CUSTOM PROCEDURE TRAY) ×2 IMPLANT
PACK UNIVERSAL I (CUSTOM PROCEDURE TRAY) ×2 IMPLANT
PAD ARMBOARD 7.5X6 YLW CONV (MISCELLANEOUS) ×4 IMPLANT
PASSER ELEVATOR (SPINAL CORD STIMULATOR) ×1 IMPLANT
PATIENT TRIAL KIT ×2 IMPLANT
PATTIES SURGICAL .5 X1 (DISPOSABLE) ×2 IMPLANT
SPONGE LAP 4X18 X RAY DECT (DISPOSABLE) IMPLANT
SPONGE SURGIFOAM ABS GEL 100 (HEMOSTASIS) ×2 IMPLANT
STAPLER VISISTAT 35W (STAPLE) IMPLANT
STRIP CLOSURE SKIN 1/2X4 (GAUZE/BANDAGES/DRESSINGS) ×2 IMPLANT
SURGIFLO TRUKIT (HEMOSTASIS) IMPLANT
SUT FIBERWIRE #2 38 REV NDL BL (SUTURE) ×2
SUT MNCRL AB 3-0 PS2 18 (SUTURE) ×4 IMPLANT
SUT VIC AB 1 CT1 27 (SUTURE) ×1
SUT VIC AB 1 CT1 27XBRD ANBCTR (SUTURE) ×1 IMPLANT
SUT VIC AB 2-0 CT1 18 (SUTURE) ×4 IMPLANT
SUTURE FIBERWR#2 38 REV NDL BL (SUTURE) ×1 IMPLANT
SYR BULB IRRIGATION 50ML (SYRINGE) ×2 IMPLANT
SYR CONTROL 10ML LL (SYRINGE) ×2 IMPLANT
TOWEL OR 17X24 6PK STRL BLUE (TOWEL DISPOSABLE) ×2 IMPLANT
TOWEL OR 17X26 10 PK STRL BLUE (TOWEL DISPOSABLE) ×2 IMPLANT
TRAY FOLEY CATH 14FR (SET/KITS/TRAYS/PACK) IMPLANT
WATER STERILE IRR 1000ML POUR (IV SOLUTION) ×2 IMPLANT

## 2012-07-18 NOTE — H&P (Signed)
No change in clinical exam H+P reviewed  

## 2012-07-18 NOTE — Transfer of Care (Signed)
Immediate Anesthesia Transfer of Care Note  Patient: [name]  Procedure(s) Performed: Procedure(s) (LRB) with comments: LUMBAR SPINAL CORD STIMULATOR INSERTION (N/A) - trial spinal cord stimulator implant   Patient Location: PACU  Anesthesia Type:General  Level of Consciousness: awake, alert , oriented and patient cooperative  Airway & Oxygen Therapy: Patient Spontanous Breathing and Patient connected to nasal cannula oxygen  Post-op Assessment: Report given to PACU RN, Post -op Vital signs reviewed and stable and Patient moving all extremities X 4  Post vital signs: Reviewed and stable  Complications: No apparent anesthesia complications

## 2012-07-18 NOTE — Anesthesia Postprocedure Evaluation (Signed)
Anesthesia Post Note  Patient: [name]  Procedure(s) Performed: Procedure(s) (LRB): LUMBAR SPINAL CORD STIMULATOR INSERTION (N/A)  Anesthesia type: General  Patient location: PACU  Post pain: Pain level controlled and Adequate analgesia  Post assessment: Post-op Vital signs reviewed, Patient's Cardiovascular Status Stable, Respiratory Function Stable, Patent Airway and Pain level controlled  Last Vitals:  Filed Vitals:   07/18/12 1602  BP:   Pulse: 72  Temp:   Resp: 19    Post vital signs: Reviewed and stable  Level of consciousness: awake, alert  and oriented  Complications: No apparent anesthesia complications

## 2012-07-18 NOTE — Brief Op Note (Signed)
07/18/2012  3:49 PM  PATIENT:  [name]  41 y.o. male  PRE-OPERATIVE DIAGNOSIS:  chronic pain   POST-OPERATIVE DIAGNOSIS:  chronic pain  PROCEDURE:  Procedure(s) (LRB) with comments: LUMBAR SPINAL CORD STIMULATOR INSERTION (N/A) - trial spinal cord stimulator implant   SURGEON:  Surgeon(s) and Role:    * Venita Lick, MD - Primary  PHYSICIAN ASSISTANT:   ASSISTANTS: none   ANESTHESIA:   general  EBL:  Total I/O In: 2000 [I.V.:2000] Out: -   BLOOD ADMINISTERED:none  DRAINS: none   LOCAL MEDICATIONS USED:  MARCAINE     SPECIMEN:  No Specimen  DISPOSITION OF SPECIMEN:  N/A  COUNTS:  YES  TOURNIQUET:  * No tourniquets in log *  DICTATION: .Other Dictation: Dictation Number [id number]  PLAN OF CARE: Admit for overnight observation  PATIENT DISPOSITION:  PACU - hemodynamically stable.

## 2012-07-18 NOTE — Anesthesia Preprocedure Evaluation (Addendum)
Anesthesia Evaluation  Patient identified by MRN, date of birth, ID band Patient awake    Reviewed: Allergy & Precautions, H&P , NPO status , Patient's Chart, lab work & pertinent test results  History of Anesthesia Complications Negative for: history of anesthetic complications  Airway Mallampati: II      Dental   Pulmonary sleep apnea and Continuous Positive Airway Pressure Ventilation , pneumonia -, resolved,  breath sounds clear to auscultation        Cardiovascular hypertension, Pt. on medications + dysrhythmias Atrial Fibrillation Rhythm:Regular Rate:Normal  Hx afib 5 yrs ago NSR now    Neuro/Psych PSYCHIATRIC DISORDERS Depression    GI/Hepatic GERD-  Medicated and Controlled,  Endo/Other    Renal/GU      Musculoskeletal   Abdominal   Peds  Hematology   Anesthesia Other Findings   Reproductive/Obstetrics                          Anesthesia Physical Anesthesia Plan  ASA: III  Anesthesia Plan: General   Post-op Pain Management:    Induction: Intravenous  Airway Management Planned: Oral ETT  Additional Equipment:   Intra-op Plan:   Post-operative Plan: Extubation in OR  Informed Consent: I have reviewed the patients History and Physical, chart, labs and discussed the procedure including the risks, benefits and alternatives for the proposed anesthesia with the patient or authorized representative who has indicated his/her understanding and acceptance.   Dental advisory given  Plan Discussed with: CRNA, Anesthesiologist and Surgeon  Anesthesia Plan Comments:         Anesthesia Quick Evaluation

## 2012-07-19 ENCOUNTER — Encounter (HOSPITAL_COMMUNITY): Payer: Self-pay | Admitting: General Practice

## 2012-07-19 MED FILL — Oxycodone HCl Tab 5 MG: ORAL | Qty: 4 | Status: AC

## 2012-07-19 NOTE — Op Note (Signed)
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  POSTOPERATIVE DIAGNOSIS:  Symptomatic chronic back pain (failed back syndrome).  OPERATIVE PROCEDURE:  Open trial spinal cord stimulator implantation. T9 laminotomy with insertion of a Environmental manager paddle lead.  COMPLICATIONS:  None.  CONDITION:  Stable.  HISTORY:  This is a very pleasant gentleman who has had previous lumbar spinal surgery and fusion who has had ongoing significant pain.  As a result of failed back syndrome, an attempt was made by Dr. Thyra Breed, to place a percutaneous spinal cord stimulator lead.  However, this was unsuccessful because of scar tissue and other issues.  As a result, the patient was sent to me to for an open trial spinal cord stimulator placement.  After discussing all risks and benefits of surgery, the patient consented to the surgery.  The plan is to implant the paddle into the thoracic spine and then have it exit through a separate incision.  If the patient has a successful trial period over the next 6 days, then it will be converted to a permanent implant.  The patient expressed an understanding of the surgical plan and consent was obtained.  OPERATIVE NOTE:  The patient was brought to the operating room, placed supine the operating table.  After successful induction of general anesthesia and endotracheal intubation, TEDs, SCDs were applied.  He was turned prone onto the Wilson frame and all bony prominences were well padded.  The thoracic and lumbar spine was prepped and draped in a standard fashion.  Time-out was done confirming patient, procedure,  and all other pertinent important data.  Once this was completed, the fluoro was used in the lateral plane counting up from the L5-S1 junction until I identified the T9 and T10 spinous processes.  Once this was done, I marked them.  I then made a midline incision, and exposed the T9 and T10 spinous process.  Sharp dissection was carried out down to the deep fascia.  We used Cobb elevators to mobilize the paraspinal muscles and expose the spinous process and lamina in T9 and T10.  I then counted again confirming the T9 level and then using a double-action Leksell rongeur I removed the bulk of the spinous process, and using a fine nerve hook to develop a plane underneath the lamina.  Using a 2-mm Kerrison punch, I performed a generous laminotomy and then dissected using a Penfield 4, through the central raphae of the ligamentum flavum. I then removed the ligamentum flavum with a 2-mm Kerrison punch and exposed the dorsal surface of the thecal sac.  Once this was done, I then obtained the spinal cord stimulator paddle and gently advanced it to just beyond the T8 vertebral body.  This was the intended level. Once it was at the proper position, I confirmed that it was midline in the AP plane  and at the appropriate height.  Once this was done, I then sutured it to directly to the T10 vertebral spinous process using FiberWire.  Once it was secured, I then wrapped it through the T10 and T11 interspinous process ligament and then advanced it.  Once it was secured to around the T10 spinous process, I then made a second incision on the left-hand side of the lumbar spine.  This is a 1 inch incision. I then used the submuscular passer to pass the wires from the thoracic wound to this wound.  Once this was done, I then attached the extending devices and tested the wires.  They were functioning appropriately.  I then placed the extension portion.  I then made a third small stab incision just near where  the left gluteal region and then advanced the extension component wires through that little stab incision.  At this point, I had a separate incision where the extension and the actual implant leads came together.  All wounds were copiously irrigated with normal saline.  Hemostasis was obtained using bipolar electrocautery. The deep fascia of the thoracic wound was closed with interrupted #1 Vicryl sutures and then a layer of 0 running Vicryl suture superficial. The deep dermis was closed on both wounds with a #2-0 Vicryl interrupted and a 3-0 Monocryl for the skin.  Where the small stab incision where the 2 wires actually exited the skin was reapproximated with a 3-0 nylon.  In order to get an airtight seal, I then used Dermabond.  Steri- Strips and Mepilex dressings were placed after the 3-0 Monocryl was used to close the skin edges.  Bulky dry dressings were applied.  The patient was ultimately extubated, transferred to PACU without incident.  At the end of the case, all needle and sponge counts were correct.  There was no adverse intraoperative events.     Alvy Beal, MD     DDB/MEDQ  D:  07/18/2012  T:  07/19/2012  Job:  [id number]

## 2012-07-19 NOTE — Progress Notes (Signed)
    Subjective: Procedure(s) (LRB): LUMBAR SPINAL CORD STIMULATOR INSERTION (N/A) 1 Day Post-Op  Patient reports pain as 2 on 0-10 scale.  Reports decreased leg pain denies incisional back pain   Positive void Negative bowel movement Positive flatus Negative chest pain or shortness of breath  Objective: Vital signs in last 24 hours: Temp:  [97.1 F (36.2 C)-98.5 F (36.9 C)] 97.9 F (36.6 C) (12/12 0703) Pulse Rate:  [60-94] 60  (12/12 0703) Resp:  [9-20] 18  (12/12 0703) BP: (92-145)/(54-84) 145/72 mmHg (12/12 0703) SpO2:  [89 %-100 %] 99 % (12/12 0703)  Intake/Output from previous day: 12/11 0701 - 12/12 0700 In: 3246.1 [I.V.:3246.1] Out: -   Labs:  Basename 07/17/12 1335  WBC 9.6  RBC 5.25  HCT 44.1  PLT 236    Basename 07/17/12 1335  NA 138  K 3.8  CL 100  CO2 25  BUN 12  CREATININE 0.83  GLUCOSE 92  CALCIUM 9.7   No results found for this basename: LABPT:2,INR:2 in the last 72 hours  Physical Exam: Neurologically intact ABD soft Neurovascular intact Intact pulses distally Incision: dressing C/D/I and no drainage No cellulitis present Compartment soft  Assessment/Plan: Patient stable  xrays N/A Continue mobilization with physical therapy Continue care  Advance diet Up with therapy D/C to home Plan on surgery next week  - either convertion to permanent or removal of trial.  Venita Lick, MD [facility] [phone]

## 2012-07-19 NOTE — Discharge Summary (Signed)
Patient ID: [name] MRN: [id number] DOB/AGE: March 03, 1971 41 y.o.  Admit date: 07/18/2012 Discharge date: 07/19/2012  Admission Diagnoses:  Chronic pain  Discharge Diagnoses:  Active Problems:  * No active hospital problems. *   status post Procedure(s): LUMBAR SPINAL CORD STIMULATOR INSERTION  Past Medical History  Diagnosis Date  . Dysrhythmia     a-fib, sees Dr. [name], [facility] cardiology  . Hyperlipidemia   . Pneumonia     hx of  . Sleep apnea     sleep study approx 18 months ago  . Hypertension     Dr. Consuello Masse, [facility]  . GERD (gastroesophageal reflux disease)   . Arthritis     back area  . Pancreatitis     hx of  . Depression     on prozac    Surgeries: Procedure(s): LUMBAR SPINAL CORD STIMULATOR INSERTION on 07/18/2012   Consultants:  none  Discharged Condition: Improved  Hospital Course: [name] is an 41 y.o. male who was admitted 07/18/2012 for operative treatment of <principal problem not specified>. Patient failed conservative treatments (please see the history and physical for the specifics) and had severe unremitting pain that affects sleep, daily activities and work/hobbies. After pre-op clearance, the patient was taken to the operating room on 07/18/2012 and underwent  Procedure(s): LUMBAR SPINAL CORD STIMULATOR INSERTION.    Patient was given perioperative antibiotics: Anti-infectives     Start     Dose/Rate Route Frequency Ordered Stop   07/18/12 2100   ceFAZolin (ANCEF) IVPB 1 g/50 mL premix        1 g 100 mL/hr over 30 Minutes Intravenous Every 8 hours 07/18/12 1633 07/19/12 0504   07/18/12 1138   ceFAZolin (ANCEF) IVPB 2 g/50 mL premix  Status:  Discontinued        2 g 100 mL/hr over 30 Minutes Intravenous 30 min pre-op 07/18/12 1138 07/18/12 1642           Patient was given sequential compression devices and early ambulation to prevent DVT.   Patient benefited maximally from  hospital stay and there were no complications. At the time of discharge, the patient was urinating/moving their bowels without difficulty, tolerating a regular diet, pain is controlled with oral pain medications and they have been cleared by PT/OT.   Recent vital signs: Patient Vitals for the past 24 hrs:  BP Temp Temp src Pulse Resp SpO2  07/19/12 0703 145/72 mmHg 97.9 F (36.6 C) - 60  18  99 %  07/18/12 2210 136/69 mmHg 97.1 F (36.2 C) - 62  18  100 %  07/18/12 1634 - - - 88  15  99 %  07/18/12 1633 - - - 94  15  99 %  07/18/12 1632 - - - 86  13  97 %  07/18/12 1631 - - - 79  15  98 %  07/18/12 1630 118/65 mmHg - - 84  12  99 %  07/18/12 1629 - - - 87  11  97 %  07/18/12 1628 - - - 87  9  97 %  07/18/12 1627 - - - 80  14  93 %  07/18/12 1626 - - - 70  19  96 %  07/18/12 1625 - - - 70  17  97 %  07/18/12 1624 - - - 76  16  94 %  07/18/12 1623 - - - 76  18  96 %  07/18/12 1622 - - -  74  17  97 %  07/18/12 1621 - - - 78  14  94 %  07/18/12 1620 - - - 73  18  93 %  07/18/12 1619 - - - 75  18  89 %  07/18/12 1618 - - - 73  19  90 %  07/18/12 1617 - - - 73  18  94 %  07/18/12 1616 - - - 76  20  93 %  07/18/12 1615 110/59 mmHg - - 78  18  94 %  07/18/12 1614 - - - 78  17  96 %  07/18/12 1613 - - - 77  16  94 %  07/18/12 1612 - - - 76  19  92 %  07/18/12 1611 - - - 76  19  91 %  07/18/12 1610 - - - 74  20  91 %  07/18/12 1609 - - - 76  18  90 %  07/18/12 1608 - - - 73  19  91 %  07/18/12 1607 - - - 73  20  95 %  07/18/12 1606 - - - 79  17  97 %  07/18/12 1605 - - - 81  16  97 %  07/18/12 1604 - - - 81  19  96 %  07/18/12 1603 - - - 81  17  94 %  07/18/12 1602 - - - 72  19  91 %  07/18/12 1601 - 97.3 F (36.3 C) - 75  19  92 %  07/18/12 1600 92/54 mmHg - - 77  - 91 %  07/18/12 1140 119/84 mmHg 98.5 F (36.9 C) Oral 87  18  96 %     Recent laboratory studies:  Basename 07/17/12 1335  WBC 9.6  HGB 16.1  HCT 44.1  PLT 236  NA 138  K 3.8  CL 100  CO2 25  BUN 12    CREATININE 0.83  GLUCOSE 92  INR --  CALCIUM 9.7     Discharge Medications:     Medication List     As of 07/19/2012  8:42 AM    ASK your doctor about these medications         B-complex with vitamin C tablet   Take 1 tablet by mouth daily.      celecoxib 200 MG capsule   Commonly known as: CELEBREX   Take 200 mg by mouth 2 (two) times daily.      diazepam 5 MG tablet   Commonly known as: VALIUM   Take 5 mg by mouth 2 (two) times daily as needed. For muscle spasms      FLUoxetine HCl 60 MG Tabs   Take 60 mg by mouth at bedtime.      guaiFENesin 600 MG 12 hr tablet   Commonly known as: MUCINEX   Take 600 mg by mouth 2 (two) times daily as needed. For congestion      lisinopril 20 MG tablet   Commonly known as: PRINIVIL,ZESTRIL   Take 20 mg by mouth at bedtime.      LIVALO 4 MG Tabs   Generic drug: Pitavastatin Calcium   Take 4 mg by mouth at bedtime.      methocarbamol 750 MG tablet   Commonly known as: ROBAXIN   Take 750 mg by mouth 3 (three) times daily as needed. For muscle spasms      multivitamin with minerals Tabs   Take 1 tablet by mouth daily.  Oxycodone HCl 20 MG Tabs   Take 20 mg by mouth every 6 (six) hours as needed. For pain      pregabalin 150 MG capsule   Commonly known as: LYRICA   Take 150 mg by mouth 3 (three) times daily.      Verapamil HCl CR 300 MG Cp24   Take 300 mg by mouth at bedtime.      vitamin C 1000 MG tablet   Take 1,000 mg by mouth daily.        Diagnostic Studies: Dg Chest 2 View  07/17/2012  **ADDENDUM** CREATED: 07/17/2012 14:19:16  **END ADDENDUM** SIGNED BY: Rutherford Guys. Margarita Grizzle, M.D.   07/17/2012  **ADDENDUM** CREATED: 07/17/2012 14:19:16  **END ADDENDUM** SIGNED BY: Rutherford Guys. Margarita Grizzle, M.D.   07/17/2012  **ADDENDUM** CREATED: 07/17/2012 14:19:16  **END ADDENDUM** SIGNED BY: Rutherford Guys. Margarita Grizzle, M.D.   07/17/2012  *RADIOLOGY REPORT*  Clinical Data: Hypertension; preoperative for stimulator  CHEST - 2  VIEW  Comparison:  February 23, 2011  Findings: Lungs clear.  Heart size and pulmonary vascularity are normal.  No adenopathy.  No bone lesions.  IMPRESSION: No abnormality noted.   Original Report Authenticated By: Bretta Bang, M.D.    Dg Thoracic Spine 2 View  07/18/2012  *RADIOLOGY REPORT*  Clinical Data: Spinal cord stimulator.  THORACIC SPINE - 2 VIEW,DG C-ARM 61-120 MIN  Comparison: Chest radiographs 07/17/2012.  Findings: Frontal and lateral spot fluoroscopic images of the lower thoracic spine demonstrate placement of a spinal stimulator in the lower thoracic canal.  Due to small field of view, accurate numbering of the vertebral segments is not possible.  IMPRESSION: Lower thoracic spinal stimulator placement as described.   Original Report Authenticated By: Carey Bullocks, M.D.    Dg C-arm [phone] Min  07/18/2012  *RADIOLOGY REPORT*  Clinical Data: Spinal cord stimulator.  THORACIC SPINE - 2 VIEW,DG C-ARM 61-120 MIN  Comparison: Chest radiographs 07/17/2012.  Findings: Frontal and lateral spot fluoroscopic images of the lower thoracic spine demonstrate placement of a spinal stimulator in the lower thoracic canal.  Due to small field of view, accurate numbering of the vertebral segments is not possible.  IMPRESSION: Lower thoracic spinal stimulator placement as described.   Original Report Authenticated By: Carey Bullocks, M.D.           Follow-up Information    Call Alvy Beal, MD. (Monday/Tuesday next week with report on stimulator function.  if symptoms worsen)    Contact information:   [address] [place] [address] [phone]          Discharge Plan:  discharge to home   Disposition:  Patient doing well Will plan on surgery next week - either removing the trial extension leads or converted to a perm battery Patient will notify my office next week to let me know how it is working   Signed: Venita Lick D for Dr.  Venita Lick [facility] [phone] 07/19/2012, 8:42 AM

## 2012-07-19 NOTE — Progress Notes (Signed)
Pt discharge to home accompanied by wife. Discharge instructions given and explained and pt stated understanding. Pts IV was removed. Pt left unit in a stable condition via wheelchair.

## 2012-07-19 NOTE — Progress Notes (Signed)
Utilization review completed. Albirtha Grinage, RN, BSN. 

## 2012-07-19 NOTE — Evaluation (Signed)
Physical Therapy Evaluation Patient Details Name: [name] MRN: [id number] DOB: July 22, 1971 Today's Date: 07/19/2012 Time: 0945-1000 PT Time Calculation (min): 15 min  PT Assessment / Plan / Recommendation Clinical Impression  Pt is a 41 y/o male s/p insertion of spinal cord stimiulator. Pt presents with no acute or follow-up PT needs.      PT Assessment  Patent does not need any further PT services    Follow Up Recommendations  No PT follow up    Does the patient have the potential to tolerate intense rehabilitation      Barriers to Discharge        Equipment Recommendations  None recommended by PT    Recommendations for Other Services     Frequency      Precautions / Restrictions Precautions Precautions: None Restrictions Weight Bearing Restrictions: No   Pertinent Vitals/Pain No pain in LEs, 2/10 pain in surgical site.       Mobility  Bed Mobility Bed Mobility: Supine to Sit;Sit to Supine Supine to Sit: 7: Independent Sit to Supine: 7: Independent Transfers Transfers: Sit to Stand;Stand to Sit Sit to Stand: 7: Independent Stand to Sit: 7: Independent Ambulation/Gait Ambulation/Gait Assistance: 6: Modified independent (Device/Increase time) Ambulation Distance (Feet): 250 Feet Assistive device: Straight cane Gait Pattern: Within Functional Limits Stairs: Yes Stairs Assistance: 7: Independent Stair Management Technique: One rail Right Number of Stairs: 5  Wheelchair Mobility Wheelchair Mobility: No    Shoulder Instructions     Exercises     PT Diagnosis:    PT Problem List:   PT Treatment Interventions:     PT Goals Acute Rehab PT Goals PT Goal Formulation: With patient/family  Visit Information  Last PT Received On: 07/19/12 Assistance Needed: +1    Subjective Data  Subjective: Agree to PT eval    Prior Functioning  Home Living Lives With: Spouse Available Help at Discharge: Available PRN/intermittently;Family Type of Home:  House Home Layout: Two level Alternate Level Stairs-Number of Steps: 12 Alternate Level Stairs-Rails: Can reach both Bathroom Shower/Tub: Walk-in shower;Door [employer] Toilet: Standard Home Adaptive Equipment: Straight cane Prior Function Level of Independence: Independent Able to Take Stairs?: Yes Driving: Yes Vocation: Unemployed Communication Communication: No difficulties Dominant Hand: Right    Cognition  Overall Cognitive Status: Appears within functional limits for tasks assessed/performed Arousal/Alertness: Awake/alert Orientation Level: Appears intact for tasks assessed Behavior During Session: [facility] for tasks performed    Extremity/Trunk Assessment Right Upper Extremity Assessment RUE ROM/Strength/Tone: Within functional levels Left Upper Extremity Assessment LUE ROM/Strength/Tone: WFL for tasks assessed;Within functional levels Right Lower Extremity Assessment RLE ROM/Strength/Tone: Within functional levels Left Lower Extremity Assessment LLE ROM/Strength/Tone: Within functional levels LLE Sensation: WFL - Light Touch LLE Coordination: WFL - gross/fine motor;WFL - gross motor Trunk Assessment Trunk Assessment: Normal   Balance    End of Session PT - End of Session Equipment Utilized During Treatment: Gait belt Activity Tolerance: Patient tolerated treatment well Patient left: in chair;with call bell/phone within reach;with family/visitor present Nurse Communication: Mobility status  GP Functional Assessment Tool Used: clinical judgement Functional Limitation: Mobility: Walking and moving around Mobility: Walking and Moving Around Current Status [phone]): 0 percent impaired, limited or restricted Mobility: Walking and Moving Around Goal Status [phone]): 0 percent impaired, limited or restricted Mobility: Walking and Moving Around Discharge Status [phone]): 0 percent impaired, limited or restricted   Nihar Klus 07/19/2012, 1:53 PM  Ranelle Auker L. Keandra Medero DPT [phone]

## 2012-07-20 ENCOUNTER — Encounter (HOSPITAL_COMMUNITY): Payer: Self-pay | Admitting: Orthopedic Surgery

## 2012-07-24 ENCOUNTER — Other Ambulatory Visit (HOSPITAL_COMMUNITY): Payer: Self-pay | Admitting: Orthopedic Surgery

## 2012-07-24 ENCOUNTER — Inpatient Hospital Stay
Admission: RE | Admit: 2012-07-24 | Discharge: 2012-07-24 | Disposition: A | Discharge status: Home / Self Care | Payer: Self-pay | Source: Ambulatory Visit | Attending: Orthopedic Surgery | Admitting: Orthopedic Surgery

## 2012-07-24 DIAGNOSIS — R52 Pain, unspecified: Secondary | ICD-10-CM

## 2012-07-25 ENCOUNTER — Encounter (HOSPITAL_COMMUNITY): Payer: Self-pay

## 2012-07-26 ENCOUNTER — Encounter (HOSPITAL_COMMUNITY): Payer: Self-pay | Admitting: Anesthesiology

## 2012-07-26 ENCOUNTER — Ambulatory Visit (HOSPITAL_COMMUNITY)
Admission: RE | Admit: 2012-07-26 | Discharge: 2012-07-26 | Disposition: A | Discharge status: Home / Self Care | Payer: BC Managed Care – PPO | Source: Ambulatory Visit | Attending: Orthopedic Surgery | Admitting: Orthopedic Surgery

## 2012-07-26 ENCOUNTER — Encounter (HOSPITAL_COMMUNITY): Payer: Self-pay | Admitting: *Deleted

## 2012-07-26 ENCOUNTER — Ambulatory Visit (HOSPITAL_COMMUNITY): Payer: BC Managed Care – PPO | Admitting: Anesthesiology

## 2012-07-26 ENCOUNTER — Encounter (HOSPITAL_COMMUNITY)
Admission: RE | Disposition: A | Discharge status: Home / Self Care | Payer: Self-pay | Source: Ambulatory Visit | Attending: Orthopedic Surgery

## 2012-07-26 DIAGNOSIS — K219 Gastro-esophageal reflux disease without esophagitis: Secondary | ICD-10-CM | POA: Insufficient documentation

## 2012-07-26 DIAGNOSIS — F329 Major depressive disorder, single episode, unspecified: Secondary | ICD-10-CM | POA: Insufficient documentation

## 2012-07-26 DIAGNOSIS — G4730 Sleep apnea, unspecified: Secondary | ICD-10-CM | POA: Insufficient documentation

## 2012-07-26 DIAGNOSIS — G894 Chronic pain syndrome: Secondary | ICD-10-CM | POA: Insufficient documentation

## 2012-07-26 DIAGNOSIS — I4891 Unspecified atrial fibrillation: Secondary | ICD-10-CM | POA: Insufficient documentation

## 2012-07-26 DIAGNOSIS — G8929 Other chronic pain: Secondary | ICD-10-CM | POA: Diagnosis present

## 2012-07-26 DIAGNOSIS — I10 Essential (primary) hypertension: Secondary | ICD-10-CM | POA: Insufficient documentation

## 2012-07-26 DIAGNOSIS — F3289 Other specified depressive episodes: Secondary | ICD-10-CM | POA: Insufficient documentation

## 2012-07-26 HISTORY — PX: SPINAL CORD STIMULATOR REMOVAL: SHX5379

## 2012-07-26 HISTORY — DX: Dorsalgia, unspecified: M54.9

## 2012-07-26 HISTORY — DX: Other chronic pain: G89.29

## 2012-07-26 SURGERY — LUMBAR SPINAL CORD STIMULATOR REMOVAL
Anesthesia: General

## 2012-07-26 MED ORDER — HYDROMORPHONE HCL PF 1 MG/ML IJ SOLN
INTRAMUSCULAR | Status: AC
  Administered 2012-07-26: 0.5 mg via INTRAVENOUS
  Filled 2012-07-26: qty 1

## 2012-07-26 MED ORDER — BUPIVACAINE-EPINEPHRINE PF 0.25-1:200000 % IJ SOLN
INTRAMUSCULAR | Status: AC
  Filled 2012-07-26: qty 30

## 2012-07-26 MED ORDER — METHOCARBAMOL 500 MG PO TABS
ORAL_TABLET | ORAL | Status: AC
  Administered 2012-07-26: 500 mg via ORAL
  Filled 2012-07-26: qty 1

## 2012-07-26 MED ORDER — BUPIVACAINE-EPINEPHRINE 0.25% -1:200000 IJ SOLN
INTRAMUSCULAR | Status: DC | PRN
  Administered 2012-07-26: 10 mL

## 2012-07-26 MED ORDER — PROMETHAZINE HCL 25 MG/ML IJ SOLN: 6.2500 mg | INTRAMUSCULAR | Status: DC | PRN

## 2012-07-26 MED ORDER — FENTANYL CITRATE 0.05 MG/ML IJ SOLN: 50.0000 ug | Freq: Once | INTRAMUSCULAR | Status: DC

## 2012-07-26 MED ORDER — PROPOFOL 10 MG/ML IV BOLUS
INTRAVENOUS | Status: DC | PRN
  Administered 2012-07-26: 250 mg via INTRAVENOUS

## 2012-07-26 MED ORDER — OXYCODONE-ACETAMINOPHEN 5-325 MG PO TABS
1.0000 | ORAL_TABLET | ORAL | Status: DC | PRN
  Administered 2012-07-26: 2 via ORAL

## 2012-07-26 MED ORDER — LIDOCAINE HCL (CARDIAC) 20 MG/ML IV SOLN
INTRAVENOUS | Status: DC | PRN
  Administered 2012-07-26: 100 mg via INTRAVENOUS

## 2012-07-26 MED ORDER — ONDANSETRON HCL 4 MG/2ML IJ SOLN
INTRAMUSCULAR | Status: DC | PRN
  Administered 2012-07-26: 4 mg via INTRAVENOUS

## 2012-07-26 MED ORDER — METHOCARBAMOL 100 MG/ML IJ SOLN: 500.0000 mg | Freq: Four times a day (QID) | INTRAVENOUS | Status: DC | PRN

## 2012-07-26 MED ORDER — MIDAZOLAM HCL 5 MG/5ML IJ SOLN
INTRAMUSCULAR | Status: DC | PRN
  Administered 2012-07-26: 2 mg via INTRAVENOUS

## 2012-07-26 MED ORDER — LACTATED RINGERS IV SOLN
INTRAVENOUS | Status: DC | PRN
  Administered 2012-07-26: 10:00:00 via INTRAVENOUS

## 2012-07-26 MED ORDER — CEFAZOLIN SODIUM-DEXTROSE 2-3 GM-% IV SOLR: 2.0000 g | Freq: Once | INTRAVENOUS | Status: DC

## 2012-07-26 MED ORDER — HYDROMORPHONE HCL PF 1 MG/ML IJ SOLN
0.2500 mg | INTRAMUSCULAR | Status: DC | PRN
  Administered 2012-07-26 (×4): 0.5 mg via INTRAVENOUS

## 2012-07-26 MED ORDER — SUCCINYLCHOLINE CHLORIDE 20 MG/ML IJ SOLN
INTRAMUSCULAR | Status: DC | PRN
  Administered 2012-07-26: 140 mg via INTRAVENOUS

## 2012-07-26 MED ORDER — LACTATED RINGERS IV SOLN
INTRAVENOUS | Status: DC
  Administered 2012-07-26: 10:00:00 via INTRAVENOUS

## 2012-07-26 MED ORDER — METHOCARBAMOL 500 MG PO TABS
500.0000 mg | ORAL_TABLET | Freq: Four times a day (QID) | ORAL | Status: DC | PRN
  Administered 2012-07-26: 500 mg via ORAL

## 2012-07-26 MED ORDER — CEPHALEXIN 500 MG PO CAPS: 500.0000 mg | ORAL_CAPSULE | Freq: Four times a day (QID) | ORAL | Status: DC

## 2012-07-26 MED ORDER — MIDAZOLAM HCL 2 MG/2ML IJ SOLN: 1.0000 mg | INTRAMUSCULAR | Status: DC | PRN

## 2012-07-26 MED ORDER — OXYCODONE HCL 5 MG PO TABS
5.0000 mg | ORAL_TABLET | ORAL | Status: DC | PRN
Start: 2012-07-26 — End: 2012-07-26

## 2012-07-26 MED ORDER — CEFAZOLIN SODIUM-DEXTROSE 2-3 GM-% IV SOLR
INTRAVENOUS | Status: DC | PRN
  Administered 2012-07-26: 2 g via INTRAVENOUS

## 2012-07-26 MED ORDER — 0.9 % SODIUM CHLORIDE (POUR BTL) OPTIME
TOPICAL | Status: DC | PRN
  Administered 2012-07-26: 1000 mL

## 2012-07-26 MED ORDER — ARTIFICIAL TEARS OP OINT
TOPICAL_OINTMENT | OPHTHALMIC | Status: DC | PRN
  Administered 2012-07-26: 1 via OPHTHALMIC

## 2012-07-26 MED ORDER — FENTANYL CITRATE 0.05 MG/ML IJ SOLN
INTRAMUSCULAR | Status: DC | PRN
  Administered 2012-07-26: 100 ug via INTRAVENOUS
  Administered 2012-07-26: 50 ug via INTRAVENOUS

## 2012-07-26 MED ORDER — ACETAMINOPHEN 10 MG/ML IV SOLN
1000.0000 mg | Freq: Once | INTRAVENOUS | Status: AC
  Administered 2012-07-26: 1000 mg via INTRAVENOUS

## 2012-07-26 MED ORDER — ACETAMINOPHEN 10 MG/ML IV SOLN
INTRAVENOUS | Status: AC
  Filled 2012-07-26: qty 100

## 2012-07-26 MED ORDER — OXYCODONE-ACETAMINOPHEN 5-325 MG PO TABS
ORAL_TABLET | ORAL | Status: AC
  Administered 2012-07-26: 2 via ORAL
  Filled 2012-07-26: qty 2

## 2012-07-26 MED ORDER — CEFAZOLIN SODIUM-DEXTROSE 2-3 GM-% IV SOLR
INTRAVENOUS | Status: AC
  Filled 2012-07-26: qty 50

## 2012-07-26 SURGICAL SUPPLY — 55 items
BAG ISOLATION DRAPE 18X18 (DRAPES) ×1 IMPLANT
CANISTER SUCTION 2500CC (MISCELLANEOUS) ×2 IMPLANT
CLOTH BEACON ORANGE TIMEOUT ST (SAFETY) ×2 IMPLANT
CLSR STERI-STRIP ANTIMIC 1/2X4 (GAUZE/BANDAGES/DRESSINGS) ×2 IMPLANT
CORDS BIPOLAR (ELECTRODE) ×4 IMPLANT
DRAPE C-ARM 42X72 X-RAY (DRAPES) ×2 IMPLANT
DRAPE INCISE IOBAN 85X60 (DRAPES) ×2 IMPLANT
DRAPE ISOLATION BAG 18X18 (DRAPES) ×1
DRAPE SURG 17X23 STRL (DRAPES) ×2 IMPLANT
DRAPE U-SHAPE 47X51 STRL (DRAPES) ×2 IMPLANT
DRSG MEPILEX BORDER 4X4 (GAUZE/BANDAGES/DRESSINGS) ×2 IMPLANT
DRSG MEPILEX BORDER 4X8 (GAUZE/BANDAGES/DRESSINGS) ×4 IMPLANT
DURAPREP 26ML APPLICATOR (WOUND CARE) ×2 IMPLANT
ELECT CAUTERY BLADE 6.4 (BLADE) ×2 IMPLANT
ELECT REM PT RETURN 9FT ADLT (ELECTROSURGICAL) ×2
ELECTRODE REM PT RTRN 9FT ADLT (ELECTROSURGICAL) ×1 IMPLANT
EXTENSION 35CM (Spinal Cord Stimulator) ×4 IMPLANT
GLOVE BIOGEL PI IND STRL 6.5 (GLOVE) ×1 IMPLANT
GLOVE BIOGEL PI IND STRL 8.5 (GLOVE) ×1 IMPLANT
GLOVE BIOGEL PI INDICATOR 6.5 (GLOVE) ×1
GLOVE BIOGEL PI INDICATOR 8.5 (GLOVE) ×1
GLOVE ECLIPSE 6.0 STRL STRAW (GLOVE) ×2 IMPLANT
GLOVE ECLIPSE 8.5 STRL (GLOVE) ×4 IMPLANT
GOWN PREVENTION PLUS XXLARGE (GOWN DISPOSABLE) ×2 IMPLANT
GOWN STRL NON-REIN LRG LVL3 (GOWN DISPOSABLE) ×4 IMPLANT
KIT BASIN OR (CUSTOM PROCEDURE TRAY) ×2 IMPLANT
KIT ROOM TURNOVER OR (KITS) ×2 IMPLANT
NDL SUT 6 .5 CRC .975X.05 MAYO (NEEDLE) ×1 IMPLANT
NEEDLE 22X1 1/2 (OR ONLY) (NEEDLE) ×2 IMPLANT
NEEDLE MAYO TAPER (NEEDLE) ×1
NEEDLE SPNL 18GX3.5 QUINCKE PK (NEEDLE) ×4 IMPLANT
NS IRRIG 1000ML POUR BTL (IV SOLUTION) ×2 IMPLANT
PACK LAMINECTOMY ORTHO (CUSTOM PROCEDURE TRAY) ×2 IMPLANT
PACK UNIVERSAL I (CUSTOM PROCEDURE TRAY) ×2 IMPLANT
PAD ARMBOARD 7.5X6 YLW CONV (MISCELLANEOUS) ×4 IMPLANT
PRECISION CHARGING SYSTEM ×2 IMPLANT
PRECISION SPECTRA REMOTE CONTROL KIT ×2 IMPLANT
SPONGE LAP 4X18 X RAY DECT (DISPOSABLE) ×2 IMPLANT
SPONGE SURGIFOAM ABS GEL 100 (HEMOSTASIS) IMPLANT
STAPLER VISISTAT 35W (STAPLE) ×2 IMPLANT
STRIP CLOSURE SKIN 1/2X4 (GAUZE/BANDAGES/DRESSINGS) ×4 IMPLANT
SURGIFLO TRUKIT (HEMOSTASIS) IMPLANT
SUT FIBERWIRE #2 38 REV NDL BL (SUTURE) ×2
SUT MNCRL AB 3-0 PS2 18 (SUTURE) ×2 IMPLANT
SUT VIC AB 1 CT1 27 (SUTURE) ×1
SUT VIC AB 1 CT1 27XBRD ANBCTR (SUTURE) ×1 IMPLANT
SUT VIC AB 2-0 CT1 18 (SUTURE) ×2 IMPLANT
SUTURE FIBERWR#2 38 REV NDL BL (SUTURE) ×1 IMPLANT
SYR BULB IRRIGATION 50ML (SYRINGE) ×2 IMPLANT
SYR CONTROL 10ML LL (SYRINGE) ×2 IMPLANT
TOWEL OR 17X24 6PK STRL BLUE (TOWEL DISPOSABLE) ×2 IMPLANT
TOWEL OR 17X26 10 PK STRL BLUE (TOWEL DISPOSABLE) ×2 IMPLANT
TRAY FOLEY CATH 14FR (SET/KITS/TRAYS/PACK) IMPLANT
WATER STERILE IRR 1000ML POUR (IV SOLUTION) ×2 IMPLANT
impulse generator kit (Generator) ×2 IMPLANT

## 2012-07-26 NOTE — H&P (Signed)
Patient with excellent relief of pain during trial stimulator placement No change in clinical exam H+P reviewed Plan on convertion to permanent stimulator

## 2012-07-26 NOTE — Brief Op Note (Signed)
07/26/2012  11:44 AM  PATIENT:  [name]  41 y.o. male  PRE-OPERATIVE DIAGNOSIS:  BACK PAIN  POST-OPERATIVE DIAGNOSIS:  * No post-op diagnosis entered *  PROCEDURE:  Procedure(s) (LRB) with comments: LUMBAR SPINAL CORD STIMULATOR REMOVAL (N/A) - REMOVAL OF TRIAL LEAD OR CONVERSION TO PERM SPINAL CORD STIMULATOR IMPLANT  SURGEON:  Surgeon(s) and Role:    * Venita Lick, MD - Primary  PHYSICIAN ASSISTANT:   ASSISTANTS: none   ANESTHESIA:   general  EBL:  Total I/O In: 700 [I.V.:700] Out: 15 [Blood:15]  BLOOD ADMINISTERED:none  DRAINS: none   LOCAL MEDICATIONS USED:  MARCAINE     SPECIMEN:  No Specimen  DISPOSITION OF SPECIMEN:  N/A  COUNTS:  YES  TOURNIQUET:  * No tourniquets in log *  DICTATION: .Other Dictation: Dictation Number [id number]  PLAN OF CARE: Discharge to home after PACU  PATIENT DISPOSITION:  PACU - hemodynamically stable.

## 2012-07-26 NOTE — Anesthesia Postprocedure Evaluation (Signed)
  Anesthesia Post-op Note  Patient: [name]  Procedure(s) Performed: Procedure(s) (LRB) with comments: LUMBAR SPINAL CORD STIMULATOR REMOVAL (N/A) - REMOVAL OF TRIAL LEAD OR CONVERSION TO PERM SPINAL CORD STIMULATOR IMPLANT  Patient Location: PACU  Anesthesia Type:General  Level of Consciousness: awake and alert   Airway and Oxygen Therapy: Patient Spontanous Breathing  Post-op Pain: mild  Post-op Assessment: Post-op Vital signs reviewed, Patient's Cardiovascular Status Stable, Respiratory Function Stable, Patent Airway, No signs of Nausea or vomiting and Pain level controlled  Post-op Vital Signs: stable  Complications: No apparent anesthesia complications

## 2012-07-26 NOTE — Transfer of Care (Signed)
Immediate Anesthesia Transfer of Care Note  Patient: [name]  Procedure(s) Performed: Procedure(s) (LRB) with comments: LUMBAR SPINAL CORD STIMULATOR REMOVAL (N/A) - REMOVAL OF TRIAL LEAD OR CONVERSION TO PERM SPINAL CORD STIMULATOR IMPLANT  Patient Location: PACU  Anesthesia Type:General  Level of Consciousness: awake, alert , oriented and patient cooperative  Airway & Oxygen Therapy: Patient Spontanous Breathing and Patient connected to nasal cannula oxygen  Post-op Assessment: Report given to PACU RN, Post -op Vital signs reviewed and stable and Patient moving all extremities X 4  Post vital signs: Reviewed and stable  Complications: No apparent anesthesia complications

## 2012-07-26 NOTE — Progress Notes (Signed)
Report given to kay rn as caregiver 

## 2012-07-26 NOTE — Anesthesia Preprocedure Evaluation (Addendum)
Anesthesia Evaluation  Patient identified by MRN, date of birth, ID band Patient awake    Reviewed: Allergy & Precautions, H&P , NPO status , Patient's Chart, lab work & pertinent test results  History of Anesthesia Complications Negative for: history of anesthetic complications  Airway Mallampati: II TM Distance: >3 FB Neck ROM: Full    Dental  (+) Teeth Intact and Dental Advisory Given   Pulmonary sleep apnea and Continuous Positive Airway Pressure Ventilation , pneumonia -, resolved,  breath sounds clear to auscultation        Cardiovascular hypertension, Pt. on medications + dysrhythmias Atrial Fibrillation Rhythm:Regular Rate:Normal  Hx afib 5 yrs ago NSR now    Neuro/Psych PSYCHIATRIC DISORDERS Depression    GI/Hepatic GERD-  Medicated and Controlled,  Endo/Other  Morbid obesity  Renal/GU      Musculoskeletal   Abdominal (+) + obese,   Peds  Hematology   Anesthesia Other Findings   Reproductive/Obstetrics                          Anesthesia Physical Anesthesia Plan  ASA: III  Anesthesia Plan: General   Post-op Pain Management:    Induction: Intravenous  Airway Management Planned: Oral ETT  Additional Equipment:   Intra-op Plan:   Post-operative Plan: Extubation in OR  Informed Consent: I have reviewed the patients History and Physical, chart, labs and discussed the procedure including the risks, benefits and alternatives for the proposed anesthesia with the patient or authorized representative who has indicated his/her understanding and acceptance.     Plan Discussed with: CRNA and Surgeon  Anesthesia Plan Comments:         Anesthesia Quick Evaluation

## 2012-07-26 NOTE — Anesthesia Postprocedure Evaluation (Signed)
  Anesthesia Post-op Note  Patient: [name]  Procedure(s) Performed: Procedure(s) (LRB) with comments: LUMBAR SPINAL CORD STIMULATOR REMOVAL (N/A) - REMOVAL OF TRIAL LEAD OR CONVERSION TO PERM SPINAL CORD STIMULATOR IMPLANT  Patient Location: PACU  Anesthesia Type:General  Level of Consciousness: awake and alert   Airway and Oxygen Therapy: Patient Spontanous Breathing  Post-op Pain: mild  Post-op Assessment: Post-op Vital signs reviewed, Patient's Cardiovascular Status Stable, Respiratory Function Stable, Patent Airway, No signs of Nausea or vomiting and Pain level controlled  Post-op Vital Signs: stable  Complications: No apparent anesthesia complications 

## 2012-07-27 NOTE — Op Note (Signed)
NAMEJADAN, [name]             ACCOUNT NO.:  [id number]  MEDICAL RECORD NO.:  [id number]  LOCATION:  [facility]                         FACILITY:  [facility]  PHYSICIAN:  Alvy Beal, MD    DATE OF BIRTH:  1971-03-15  DATE OF PROCEDURE:  07/26/2012 DATE OF DISCHARGE:  07/26/2012                              OPERATIVE REPORT   PREOPERATIVE DIAGNOSIS:  Chronic pain syndrome, status post trial of spinal cord stimulator implant.  POSTOPERATIVE DIAGNOSIS:  Chronic pain syndrome, status post trial of spinal cord stimulator implant.  OPERATIVE PROCEDURE:  Conversion of temporary trial prosthesis to permanent implant.  COMPLICATIONS:  None.  CONDITION:  Stable.  HISTORY:  This is a very pleasant gentleman who last week underwent a trial of spinal cord stimulator placement.  There was some drainage where the leads were exiting the body, but other than that, he said he had excellent coverage.  He required less medication.  He felt that even though there was pain, he was able to have a much more restful night and he was able to do more activities.  As a result of the positive response, we elected to proceed with conversion to the permanent implant rather than explantation.  All appropriate risks, benefits, and alternatives were explained to the patient, and consent was obtained.  OPERATIVE NOTE:  In the holding area, the patient was evaluated in the sitting and standing position to identify the best location for the battery.  Initially, we thought it was going to be below the belt line, but he prefer to have it above it.  The patient was brought to the operating room and placed supine on the operating table.  After successful induction of general anesthesia and endotracheal intubation, the patient was turned into the lateral position, left side up on a bean bag.  Axillary roll was placed.  All bony prominences were well padded and the back was prepped and draped in standard fashion.   The time-out was done confirming patient, procedure, and all other pertinent important data.  Upon inspection, there was no gross contamination of either wound.  The location of where at first, I made a re-incision where the extensions were connected to the actual implant.  I disconnected them from the implant leads and then removed them.  I washed this wound out copiously with normal saline.  The second wound where the extension leads were actually exited the spine, I made that slightly larger, debrided it and irrigated it copiously.  I then made another incision at the same width as the width of the battery and then made a pocket 2 cm deep from the skin.  I then passed the stimulator leads submuscular from one incision to the battery site incision.  I then dried them and connected them to the battery and torqued the locking bolts appropriately.  Using a FiberWire, I secured the battery to the deep fascia.  I then irrigated all wounds copiously with normal saline and closed them in a layered fashion with #1 Vicryl suture, 2-0 Vicryl suture pop-offs and a 3-0 Monocryl for the skin.  All wounds were clean.  Steri-Strips were applied.  At the end of the case, all needle  and sponge counts were correct.  There was no adverse intraoperative events and the battery was functioning appropriately according to the wrap.  The patient will be discharged to home from the PACU once he is hemodynamically stable and ambulatory.  He will follow up with me in 2 weeks for evaluation of the wound.  Because of the issue with his wound healing with a little bit of drainage, I am going to send him home on a 10-day course of Keflex since he only got his preoperative Ancef dose.     Alvy Beal, MD     DDB/MEDQ  D:  07/26/2012  T:  07/27/2012  Job:  [id number]

## 2012-07-30 ENCOUNTER — Encounter (HOSPITAL_COMMUNITY): Payer: Self-pay | Admitting: Orthopedic Surgery

## 2013-06-21 ENCOUNTER — Encounter (INDEPENDENT_AMBULATORY_CARE_PROVIDER_SITE_OTHER): Payer: Self-pay

## 2013-06-21 ENCOUNTER — Encounter (INDEPENDENT_AMBULATORY_CARE_PROVIDER_SITE_OTHER): Payer: Self-pay | Admitting: General Surgery

## 2013-06-21 ENCOUNTER — Ambulatory Visit (INDEPENDENT_AMBULATORY_CARE_PROVIDER_SITE_OTHER): Payer: Commercial Managed Care - PPO | Admitting: General Surgery

## 2013-06-21 VITALS — BP 142/80 | HR 80 | Temp 97.6°F | Resp 14 | Ht 73.0 in | Wt 315.4 lb

## 2013-06-21 DIAGNOSIS — R1032 Left lower quadrant pain: Secondary | ICD-10-CM

## 2013-06-21 HISTORY — DX: Left lower quadrant pain: R10.32

## 2013-06-21 NOTE — Progress Notes (Signed)
Patient ID: [name], male   DOB: 12-24-1970, 42 y.o.   MRN: [id number]  Chief Complaint  Patient presents with  . New Evaluation    eval incisional hernia    HPI [name] is a 42 y.o. male.  We're asked to see the patient in consultation by Dr. Sherral Hammers to evaluate him for a ventral hernia. The patient is a 42 year old white male who has had multiple surgeries on his abdomen. For the last year he has felt a soft spot in his left lower quadrant near the site of a previous incision. He states that at times it feels larger than at others. He denies any nausea or vomiting associated with it. He has normal bowel movements. He does have chronic back pain and has I nerve stimulator implanted. He also has a history of a left inguinal hernia repair that he believes was done in 2000.  HPI  Past Medical History  Diagnosis Date  . Hyperlipidemia   . Pneumonia     hx of  . Hypertension     Dr. Consuello Masse, [facility]  . GERD (gastroesophageal reflux disease)   . Arthritis     back area  . Pancreatitis     hx of  . Depression     on prozac  . Dysrhythmia     a-fib, sees Dr. [name], [facility] cardiology  . Sleep apnea     sleep study approx 18 months ago, uses cpap  . Chronic back pain     Past Surgical History  Procedure Laterality Date  . Cholecystectomy    . Hernia repair    . Back surgery      x2 fusions, L5-S1, L4-L5  . Tonsillectomy    . Appendectomy    . Diagnostic laparoscopy      exploratory lab, abdominal  . Vasectomy    . Refractive surgery    . Spinal cord stimulator implant  07/18/2012  . Spinal cord stimulator insertion  07/18/2012    Procedure: LUMBAR SPINAL CORD STIMULATOR INSERTION;  Surgeon: Venita Lick, MD;  Location: [facility] OR;  Service: Orthopedics;  Laterality: N/A;  trial spinal cord stimulator implant   . Spinal cord stimulator removal  07/26/2012    Procedure: LUMBAR SPINAL CORD STIMULATOR REMOVAL;  Surgeon: Venita Lick,  MD;  Location: [facility] OR;  Service: Orthopedics;  Laterality: N/A;  REMOVAL OF TRIAL LEAD OR CONVERSION TO PERM SPINAL CORD STIMULATOR IMPLANT    History reviewed. No pertinent family history.  Social History History  Substance Use Topics  . Smoking status: Former Smoker    Quit date: 08/08/2009  . Smokeless tobacco: Former Neurosurgeon     Comment: stopped apprx 2 years ago  . Alcohol Use: No    Allergies  Allergen Reactions  . Nexium [Esomeprazole Magnesium] Hives and Swelling  . Wellbutrin [Bupropion] Hives and Swelling    Current Outpatient Prescriptions  Medication Sig Dispense Refill  . diazepam (VALIUM) 5 MG tablet Take 5 mg by mouth 2 (two) times daily as needed. For muscle spasms      . FLUoxetine HCl 60 MG TABS Take 60 mg by mouth at bedtime.      Marland Kitchen lisinopril (PRINIVIL,ZESTRIL) 20 MG tablet Take 20 mg by mouth at bedtime.      . methocarbamol (ROBAXIN) 750 MG tablet Take 750 mg by mouth 3 (three) times daily as needed. For muscle spasms      . Oxycodone HCl 20 MG TABS Take 20 mg by  mouth every 6 (six) hours as needed. For pain      . Verapamil HCl CR 300 MG CP24 Take 300 mg by mouth at bedtime.      Marland Kitchen guaiFENesin (MUCINEX) 600 MG 12 hr tablet Take 600 mg by mouth 2 (two) times daily as needed. For congestion      . Pitavastatin Calcium (LIVALO) 4 MG TABS Take 4 mg by mouth at bedtime.       No current facility-administered medications for this visit.    Review of Systems Review of Systems  Constitutional: Negative.   HENT: Negative.   Eyes: Negative.   Respiratory: Negative.   Cardiovascular: Negative.   Gastrointestinal: Positive for abdominal pain.  Endocrine: Negative.   Genitourinary: Negative.   Musculoskeletal: Positive for back pain.  Skin: Negative.   Allergic/Immunologic: Negative.   Neurological: Negative.   Hematological: Negative.   Psychiatric/Behavioral: Negative.     Blood pressure 142/80, pulse 80, temperature 97.6 F (36.4 C), temperature source  Temporal, resp. rate 14, height 6\' 1"  (1.854 m), weight 315 lb 6.4 oz (143.065 kg).  Physical Exam Physical Exam  Constitutional: He is oriented to person, place, and time. He appears well-developed and well-nourished.  HENT:  Head: Normocephalic and atraumatic.  Eyes: Conjunctivae and EOM are normal. Pupils are equal, round, and reactive to light.  Neck: Normal range of motion. Neck supple.  Cardiovascular: Normal rate, regular rhythm and normal heart sounds.   Abdominal: Soft. Bowel sounds are normal.  There is some tenderness to palpation in his left lower quadrant. I cannot appreciate a fascial defect at this location. The rest of his abdominal wall is nontender and feels very solid  Genitourinary:  There is no palpable bulge or impulse with straining in either groin  Musculoskeletal: Normal range of motion.  Neurological: He is alert and oriented to person, place, and time.  Skin: Skin is warm and dry.  Psychiatric: He has a normal mood and affect. His behavior is normal.    Data Reviewed As above  Assessment    The patient definitely has some left lower quadrant pain that is associated with a previous incision. I cannot definitely appreciate a fascial defect on exam. Because of this I would recommend getting a CT scan of his abdomen and pelvis to rule out a ventral hernia     Plan    Plan for CT scan of the abdomen and pelvis. We will plan to see him back in about 3 weeks ago for the results of the study. He has asked that we contact Dr. Shon Baton to see if there is any other imaging that he would need that could potentially be done at the same time.        TOTH III,PAUL S 06/21/2013, 3:52 PM

## 2013-06-21 NOTE — Patient Instructions (Signed)
Will get CT of abdomen and pelvis to rule out a hernia

## 2013-07-09 ENCOUNTER — Other Ambulatory Visit (HOSPITAL_COMMUNITY): Payer: Self-pay | Admitting: Orthopedic Surgery

## 2013-07-09 ENCOUNTER — Telehealth (INDEPENDENT_AMBULATORY_CARE_PROVIDER_SITE_OTHER): Payer: Self-pay | Admitting: *Deleted

## 2013-07-09 DIAGNOSIS — M545 Low back pain: Secondary | ICD-10-CM

## 2013-07-09 NOTE — Telephone Encounter (Signed)
Patient called asking about CT Abd/Pelvis.  Patient states Dr. Carolynne Edouard had told him at last visit one would be ordered and they were going to try to get it scheduled with his CT Spine which is scheduled for this Friday.  I am unable to find an order the the CT Abd/Pelvis has been entered.  Patient also asking what the cost of the CT will be as this will help him to determine if he is going to have it or not.  Explained that I would first send a message to Dr. Carolynne Edouard to find out if he wanted a CT Abd/Pelvis then we can go from there.  Patient states understanding and agreeable.  Patient asked that appt for this Friday be cancelled since the results won't be back to discuss which patient states is the purpose of this visit.  Appt cancelled at this time.

## 2013-07-11 ENCOUNTER — Other Ambulatory Visit (INDEPENDENT_AMBULATORY_CARE_PROVIDER_SITE_OTHER): Payer: Self-pay

## 2013-07-11 DIAGNOSIS — R1032 Left lower quadrant pain: Secondary | ICD-10-CM

## 2013-07-11 NOTE — Telephone Encounter (Signed)
I called pt to notify him that I had set up for him to have the CT of abd/pelvis on 12/5 however he states that he is feeling better and doesn't feel that the CT is necessary and wants to cancel his appt for this Friday with Dr. Carolynne Edouard because he felt the hernia pop back in place.  I instructed him to call our office when he would like to reschedule.  I called and cancelled appt for CT abd/pelvis.  He is agreeable with this plan.

## 2013-07-12 ENCOUNTER — Encounter (HOSPITAL_COMMUNITY): Payer: Self-pay

## 2013-07-12 ENCOUNTER — Ambulatory Visit (HOSPITAL_COMMUNITY)
Admission: RE | Admit: 2013-07-12 | Discharge: 2013-07-12 | Disposition: A | Discharge status: Home / Self Care | Payer: 59 | Source: Ambulatory Visit | Attending: Orthopedic Surgery | Admitting: Orthopedic Surgery

## 2013-07-12 ENCOUNTER — Encounter (INDEPENDENT_AMBULATORY_CARE_PROVIDER_SITE_OTHER): Payer: Commercial Managed Care - PPO | Admitting: General Surgery

## 2013-07-12 ENCOUNTER — Ambulatory Visit (HOSPITAL_COMMUNITY): Payer: BC Managed Care – PPO

## 2013-07-12 DIAGNOSIS — IMO0002 Reserved for concepts with insufficient information to code with codable children: Secondary | ICD-10-CM | POA: Insufficient documentation

## 2013-07-12 DIAGNOSIS — M5380 Other specified dorsopathies, site unspecified: Secondary | ICD-10-CM | POA: Insufficient documentation

## 2013-07-12 DIAGNOSIS — M545 Low back pain, unspecified: Secondary | ICD-10-CM | POA: Insufficient documentation

## 2013-07-30 ENCOUNTER — Other Ambulatory Visit: Payer: Self-pay | Admitting: Orthopedic Surgery

## 2013-08-14 ENCOUNTER — Encounter (HOSPITAL_COMMUNITY): Payer: Self-pay | Admitting: Pharmacy Technician

## 2013-08-22 ENCOUNTER — Inpatient Hospital Stay (HOSPITAL_COMMUNITY): Admission: RE | Admit: 2013-08-22 | Payer: BC Managed Care – PPO | Source: Ambulatory Visit

## 2013-08-29 ENCOUNTER — Ambulatory Visit (HOSPITAL_COMMUNITY): Admission: RE | Admit: 2013-08-29 | Payer: 59 | Source: Ambulatory Visit | Admitting: Orthopedic Surgery

## 2013-08-29 ENCOUNTER — Encounter (HOSPITAL_COMMUNITY): Admission: RE | Payer: Self-pay | Source: Ambulatory Visit

## 2013-08-29 SURGERY — SACROILIAC JOINT FUSION
Anesthesia: General | Laterality: Right

## 2013-11-11 ENCOUNTER — Other Ambulatory Visit: Payer: Self-pay | Admitting: Orthopedic Surgery

## 2013-11-12 ENCOUNTER — Encounter (HOSPITAL_COMMUNITY): Payer: Self-pay

## 2013-11-15 ENCOUNTER — Ambulatory Visit (HOSPITAL_COMMUNITY)
Admission: RE | Admit: 2013-11-15 | Discharge: 2013-11-15 | Disposition: A | Discharge status: Home / Self Care | Payer: 59 | Source: Ambulatory Visit | Attending: Orthopedic Surgery | Admitting: Orthopedic Surgery

## 2013-11-15 ENCOUNTER — Encounter (HOSPITAL_COMMUNITY)
Admission: RE | Admit: 2013-11-15 | Discharge: 2013-11-15 | Disposition: A | Discharge status: Home / Self Care | Payer: 59 | Source: Ambulatory Visit | Attending: Orthopedic Surgery | Admitting: Orthopedic Surgery

## 2013-11-15 ENCOUNTER — Other Ambulatory Visit: Payer: Self-pay | Admitting: Orthopedic Surgery

## 2013-11-15 ENCOUNTER — Encounter (HOSPITAL_COMMUNITY): Payer: Self-pay

## 2013-11-15 DIAGNOSIS — Z01818 Encounter for other preprocedural examination: Secondary | ICD-10-CM | POA: Insufficient documentation

## 2013-11-15 HISTORY — DX: Other complications of anesthesia, initial encounter: T88.59XA

## 2013-11-15 HISTORY — DX: Anxiety disorder, unspecified: F41.9

## 2013-11-15 HISTORY — DX: Adverse effect of unspecified anesthetic, initial encounter: T41.45XA

## 2013-11-15 HISTORY — DX: Immune thrombocytopenic purpura: D69.3

## 2013-11-15 LAB — COMPREHENSIVE METABOLIC PANEL
ALT: 23 U/L (ref 0–53)
AST: 20 U/L (ref 0–37)
Albumin: 4 g/dL (ref 3.5–5.2)
Alkaline Phosphatase: 137 U/L — ABNORMAL HIGH (ref 39–117)
BUN: 9 mg/dL (ref 6–23)
CALCIUM: 9.4 mg/dL (ref 8.4–10.5)
CO2: 25 mEq/L (ref 19–32)
Chloride: 101 mEq/L (ref 96–112)
Creatinine, Ser: 0.85 mg/dL (ref 0.50–1.35)
GFR calc Af Amer: >90 mL/min (ref >90)
GFR calc non Af Amer: >90 mL/min (ref >90)
GLUCOSE: 92 mg/dL (ref 70–99)
Potassium: 3.9 mEq/L (ref 3.7–5.3)
Sodium: 141 mEq/L (ref 137–147)
TOTAL PROTEIN: 7.4 g/dL (ref 6.0–8.3)
Total Bilirubin: 0.3 mg/dL (ref 0.3–1.2)

## 2013-11-15 LAB — CBC WITH DIFFERENTIAL/PLATELET
Basophils Absolute: 0.1 10*3/uL (ref 0.0–0.1)
Basophils Relative: 1 % (ref 0–1)
EOS ABS: 0.3 10*3/uL (ref 0.0–0.7)
Eosinophils Relative: 3 % (ref 0–5)
HCT: 46.7 % (ref 39.0–52.0)
Hemoglobin: 16.8 g/dL (ref 13.0–17.0)
Lymphocytes Relative: 41 % (ref 12–46)
Lymphs Abs: 3.8 10*3/uL (ref 0.7–4.0)
MCH: 31.2 pg (ref 26.0–34.0)
MCHC: 36 g/dL (ref 30.0–36.0)
MCV: 86.6 fL (ref 78.0–100.0)
Monocytes Absolute: 0.7 10*3/uL (ref 0.1–1.0)
Monocytes Relative: 8 % (ref 3–12)
NEUTROS PCT: 47 % (ref 43–77)
Neutro Abs: 4.3 10*3/uL (ref 1.7–7.7)
Platelets: 256 10*3/uL (ref 150–400)
RBC: 5.39 MIL/uL (ref 4.22–5.81)
RDW: 12.8 % (ref 11.5–15.5)
WBC: 9.2 10*3/uL (ref 4.0–10.5)

## 2013-11-15 LAB — URINALYSIS, ROUTINE W REFLEX MICROSCOPIC
BILIRUBIN URINE: NEGATIVE
Glucose, UA: NEGATIVE mg/dL
KETONES UR: NEGATIVE mg/dL
Leukocytes, UA: NEGATIVE
Nitrite: NEGATIVE
PROTEIN: NEGATIVE mg/dL
Specific Gravity, Urine: 1.02 (ref 1.005–1.030)
UROBILINOGEN UA: 1 mg/dL (ref 0.0–1.0)
pH: 6.5 (ref 5.0–8.0)

## 2013-11-15 LAB — APTT: aPTT: 28 seconds (ref 24–37)

## 2013-11-15 LAB — PROTIME-INR
INR: 1 (ref 0.00–1.49)
Prothrombin Time: 13 seconds (ref 11.6–15.2)

## 2013-11-15 LAB — URINE MICROSCOPIC-ADD ON

## 2013-11-15 LAB — TYPE AND SCREEN
ABO/RH(D): B NEG
ANTIBODY SCREEN: NEGATIVE

## 2013-11-15 LAB — SURGICAL PCR SCREEN
MRSA, PCR: NEGATIVE
Staphylococcus aureus: NEGATIVE

## 2013-11-15 NOTE — Pre-Procedure Instructions (Addendum)
   Your procedure is scheduled on:  Thursday, April 16.  Report to [facility], Main Entrance/Entrance "A"at 8:00AM.  Call this number if you have problems the morning of surgery: [phone]   Remember:   Do not eat food or drink liquids after midnight Wedesday.   Take these medicines the morning of surgery with A SIP OF WATER: Take if needed:guaiFENesin (MUCINEX), methocarbamol (ROBAXIN),  Oxycodone, diazepam (VALIUM).               Stop taking ibuprofen (ADVIL,MOTRIN).    Do not wear jewelry, make-up or nail polish.  Do not wear lotions, powders, or perfumes.    Men may shave face and neck.  Do not bring valuables to the hospital.  [facility] is not responsible for any belongings or valuables.                Contacts, dentures or bridgework may not be worn into surgery.  Leave suitcase in the car. After surgery it may be brought to your room.  For patients admitted to the hospital, discharge time is determined by your  treatment team.               Patients discharged the day of surgery will not be allowed to drive home.  Name and phone number of your driver: -   Special Instructions: Review  [place] - Preparing For Surgery.   Please read over the following fact sheets that you were given: Pain Booklet, Coughing and Deep Breathing, Blood Transfusion Information and Surgical Site Infection Prevention

## 2013-11-20 MED ORDER — DEXTROSE 5 % IV SOLN
3.0000 g | INTRAVENOUS | Status: AC
  Administered 2013-11-21: 3 g via INTRAVENOUS
  Filled 2013-11-20: qty 3000

## 2013-11-20 MED ORDER — POVIDONE-IODINE 7.5 % EX SOLN
Freq: Once | CUTANEOUS | Status: DC
  Filled 2013-11-20: qty 118

## 2013-11-20 NOTE — H&P (Signed)
PREOPERATIVE H&P  Chief Complaint: Right low back pain  HPI: [name] is a 43 y.o. male who presents with ongoing right-sided low back pain. Patient's exam and history c/w right SI dysfunction. Patient has failed conservative care and did elect to proceed with surgery.  Past Medical History  Diagnosis Date  . Hyperlipidemia   . Hypertension     Dr. Allayne Butcher, [facility] family physicians  . Arthritis     back area  . Pancreatitis     hx of  . Depression     on prozac  . Sleep apnea     sleep study approx 18 months ago, uses cpap  . Chronic back pain   . Dysrhythmia     a-fib, sees Dr. Shirlee More, [facility] cardiology  . Shortness of breath     WIth exertion  . Pneumonia     hx of  2005ish  . Anxiety   . GERD (gastroesophageal reflux disease)     uses baking soda  . 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 Complication of anesthesia     noticed some memory loss after surgery 07/18/12   Past Surgical History  Procedure Laterality Date  . Cholecystectomy    . Hernia repair Left     inguinal  . Back surgery      x2 fusions, L5-S1, L4-L5  . Tonsillectomy    . Appendectomy      68 months of age  . Diagnostic laparoscopy      exploratory lab, abdominal  . Vasectomy    . Refractive surgery    . Spinal cord stimulator implant  07/18/2012  . Spinal cord stimulator insertion  07/18/2012    Procedure: LUMBAR SPINAL CORD STIMULATOR INSERTION;  Surgeon: Melina Schools, MD;  Location: [place];  Service: Orthopedics;  Laterality: N/A;  trial spinal cord stimulator implant   . Spinal cord stimulator removal  07/26/2012    Procedure: LUMBAR SPINAL CORD STIMULATOR REMOVAL;  Surgeon: Melina Schools, MD;  Location: [place];  Service: Orthopedics;  Laterality: N/A;  REMOVAL OF TRIAL LEAD OR CONVERSION TO PERM SPINAL CORD STIMULATOR IMPLANT   History   Social History  . Marital Status: Married    Spouse Name: N/A    Number of Children: N/A  . Years of  Education: N/A   Social History Main Topics  . Smoking status: Former Smoker    Quit date: 08/08/2009  . Smokeless tobacco: Former Systems developer     Comment: stopped apprx 2 years ago  . Alcohol Use: No  . Drug Use: No  . Sexual Activity: Yes   Other Topics Concern  . Not on file   Social History Narrative  . No narrative on file   No family history on file. Allergies  Allergen Reactions  . Nexium [Esomeprazole Magnesium] Hives and Swelling  . Wellbutrin [Bupropion] Hives and Swelling   Prior to Admission medications   Medication Sig Start Date End Date Taking? Authorizing Provider  diazepam (VALIUM) 5 MG tablet Take 5 mg by mouth 2 (two) times daily as needed. For muscle spasms   Yes Historical Provider, MD  FLUoxetine (PROZAC) 20 MG capsule Take 20 mg by mouth at bedtime. Give with the 40 mg to equal the 60 mg dose   Yes Historical Provider, MD  FLUoxetine (PROZAC) 40 MG capsule Take 40 mg by mouth at bedtime. Give with the 20 mg to equal 60 mg   Yes Historical Provider, MD  guaiFENesin (MUCINEX) 600 MG 12 hr tablet Take 600 mg by mouth 2 (two) times daily as needed. For congestion   Yes Historical Provider, MD  ibuprofen (ADVIL,MOTRIN) 200 MG tablet Take 600 mg by mouth every 4 (four) hours as needed for mild pain or moderate pain.   Yes Historical Provider, MD  lisinopril (PRINIVIL,ZESTRIL) 20 MG tablet Take 20 mg by mouth at bedtime.   Yes Historical Provider, MD  methocarbamol (ROBAXIN) 750 MG tablet Take 750 mg by mouth 3 (three) times daily as needed. For muscle spasms   Yes Historical Provider, MD  Omega-3 Fatty Acids (FISH OIL) 1000 MG CAPS Take 3,000 mg by mouth daily.    Yes Historical Provider, MD  Oxycodone HCl 20 MG TABS Take 20 mg by mouth every 6 (six) hours as needed. For pain   Yes Historical Provider, MD  Verapamil HCl CR 300 MG CP24 Take 300 mg by mouth at bedtime.   Yes Historical Provider, MD     All other systems have been reviewed and were otherwise negative with  the exception of those mentioned in the HPI and as above.  Physical Exam: There were no vitals filed for this visit.  General: Alert, no acute distress Cardiovascular: No pedal edema Respiratory: No cyanosis, no use of accessory musculature GI: No organomegaly, abdomen is soft and non-tender Skin: No lesions in the area of chief complaint Neurologic: Sensation intact distally Psychiatric: Patient is competent for consent with normal mood and affect Lymphatic: No axillary or cervical lymphadenopathy  MUSCULOSKELETAL: + TTP right low back  Assessment/Plan: Right sided sacroiliac joint dysfunction Plan for Procedure(s): RIGHT SACROILIAC JOINT FUSION   Sinclair Ship, MD 11/20/2013 4:13 PM

## 2013-11-21 ENCOUNTER — Encounter (HOSPITAL_COMMUNITY): Payer: Self-pay | Admitting: Anesthesiology

## 2013-11-21 ENCOUNTER — Encounter (HOSPITAL_COMMUNITY)
Admission: RE | Disposition: A | Discharge status: Home / Self Care | Payer: Self-pay | Source: Ambulatory Visit | Attending: Orthopedic Surgery

## 2013-11-21 ENCOUNTER — Encounter (HOSPITAL_COMMUNITY): Payer: 59 | Admitting: Anesthesiology

## 2013-11-21 ENCOUNTER — Ambulatory Visit (HOSPITAL_COMMUNITY)
Admission: RE | Admit: 2013-11-21 | Discharge: 2013-11-21 | Disposition: A | Discharge status: Home / Self Care | Payer: 59 | Source: Ambulatory Visit | Attending: Orthopedic Surgery | Admitting: Orthopedic Surgery

## 2013-11-21 ENCOUNTER — Ambulatory Visit (HOSPITAL_COMMUNITY): Payer: 59

## 2013-11-21 ENCOUNTER — Ambulatory Visit (HOSPITAL_COMMUNITY): Payer: 59 | Admitting: Anesthesiology

## 2013-11-21 DIAGNOSIS — E785 Hyperlipidemia, unspecified: Secondary | ICD-10-CM | POA: Insufficient documentation

## 2013-11-21 DIAGNOSIS — M533 Sacrococcygeal disorders, not elsewhere classified: Secondary | ICD-10-CM | POA: Insufficient documentation

## 2013-11-21 DIAGNOSIS — M549 Dorsalgia, unspecified: Secondary | ICD-10-CM | POA: Insufficient documentation

## 2013-11-21 DIAGNOSIS — G4730 Sleep apnea, unspecified: Secondary | ICD-10-CM | POA: Insufficient documentation

## 2013-11-21 DIAGNOSIS — G8929 Other chronic pain: Secondary | ICD-10-CM | POA: Insufficient documentation

## 2013-11-21 DIAGNOSIS — I4891 Unspecified atrial fibrillation: Secondary | ICD-10-CM | POA: Insufficient documentation

## 2013-11-21 DIAGNOSIS — Z981 Arthrodesis status: Secondary | ICD-10-CM | POA: Insufficient documentation

## 2013-11-21 DIAGNOSIS — K219 Gastro-esophageal reflux disease without esophagitis: Secondary | ICD-10-CM | POA: Insufficient documentation

## 2013-11-21 DIAGNOSIS — F411 Generalized anxiety disorder: Secondary | ICD-10-CM | POA: Insufficient documentation

## 2013-11-21 DIAGNOSIS — F329 Major depressive disorder, single episode, unspecified: Secondary | ICD-10-CM | POA: Insufficient documentation

## 2013-11-21 DIAGNOSIS — F3289 Other specified depressive episodes: Secondary | ICD-10-CM | POA: Insufficient documentation

## 2013-11-21 DIAGNOSIS — I10 Essential (primary) hypertension: Secondary | ICD-10-CM | POA: Insufficient documentation

## 2013-11-21 DIAGNOSIS — Z87891 Personal history of nicotine dependence: Secondary | ICD-10-CM | POA: Insufficient documentation

## 2013-11-21 HISTORY — PX: SACROILIAC JOINT FUSION: SHX6088

## 2013-11-21 SURGERY — SACROILIAC JOINT FUSION
Anesthesia: General | Site: Spine Lumbar | Laterality: Right

## 2013-11-21 MED ORDER — OXYCODONE HCL 5 MG/5ML PO SOLN: 5.0000 mg | Freq: Once | ORAL | Status: DC | PRN

## 2013-11-21 MED ORDER — HYDROMORPHONE HCL PF 1 MG/ML IJ SOLN
INTRAMUSCULAR | Status: AC
  Administered 2013-11-21: 0.5 mg via INTRAVENOUS
  Filled 2013-11-21: qty 2

## 2013-11-21 MED ORDER — LIDOCAINE HCL (CARDIAC) 20 MG/ML IV SOLN
INTRAVENOUS | Status: AC
  Filled 2013-11-21: qty 5

## 2013-11-21 MED ORDER — GLYCOPYRROLATE 0.2 MG/ML IJ SOLN
INTRAMUSCULAR | Status: AC
  Filled 2013-11-21: qty 2

## 2013-11-21 MED ORDER — FENTANYL CITRATE 0.05 MG/ML IJ SOLN
INTRAMUSCULAR | Status: AC
Start: 2013-11-21 — End: 2013-11-21
  Filled 2013-11-21: qty 5

## 2013-11-21 MED ORDER — NEOSTIGMINE METHYLSULFATE 1 MG/ML IJ SOLN
INTRAMUSCULAR | Status: AC
  Filled 2013-11-21: qty 10

## 2013-11-21 MED ORDER — LIDOCAINE HCL (CARDIAC) 20 MG/ML IV SOLN
INTRAVENOUS | Status: DC | PRN
  Administered 2013-11-21: 80 mg via INTRAVENOUS

## 2013-11-21 MED ORDER — HYDROMORPHONE HCL PF 1 MG/ML IJ SOLN
0.2500 mg | INTRAMUSCULAR | Status: DC | PRN
  Administered 2013-11-21 (×6): 0.5 mg via INTRAVENOUS

## 2013-11-21 MED ORDER — SUCCINYLCHOLINE CHLORIDE 20 MG/ML IJ SOLN
INTRAMUSCULAR | Status: AC
  Filled 2013-11-21: qty 1

## 2013-11-21 MED ORDER — ARTIFICIAL TEARS OP OINT
TOPICAL_OINTMENT | OPHTHALMIC | Status: DC | PRN
  Administered 2013-11-21: 1 via OPHTHALMIC

## 2013-11-21 MED ORDER — ROCURONIUM BROMIDE 100 MG/10ML IV SOLN
INTRAVENOUS | Status: DC | PRN
  Administered 2013-11-21: 10 mg via INTRAVENOUS
  Administered 2013-11-21: 40 mg via INTRAVENOUS

## 2013-11-21 MED ORDER — HYDROMORPHONE HCL PF 1 MG/ML IJ SOLN
INTRAMUSCULAR | Status: AC
  Administered 2013-11-21: 0.5 mg via INTRAVENOUS
  Filled 2013-11-21: qty 1

## 2013-11-21 MED ORDER — ROCURONIUM BROMIDE 50 MG/5ML IV SOLN
INTRAVENOUS | Status: AC
  Filled 2013-11-21: qty 1

## 2013-11-21 MED ORDER — PROPOFOL 10 MG/ML IV BOLUS
INTRAVENOUS | Status: DC | PRN
  Administered 2013-11-21: 200 mg via INTRAVENOUS

## 2013-11-21 MED ORDER — OXYCODONE HCL 5 MG PO TABS: 5.0000 mg | ORAL_TABLET | Freq: Once | ORAL | Status: DC | PRN

## 2013-11-21 MED ORDER — FENTANYL CITRATE 0.05 MG/ML IJ SOLN
INTRAMUSCULAR | Status: DC | PRN
  Administered 2013-11-21 (×6): 50 ug via INTRAVENOUS

## 2013-11-21 MED ORDER — GLYCOPYRROLATE 0.2 MG/ML IJ SOLN
INTRAMUSCULAR | Status: DC | PRN
  Administered 2013-11-21: 0.4 mg via INTRAVENOUS

## 2013-11-21 MED ORDER — ONDANSETRON HCL 4 MG/2ML IJ SOLN
INTRAMUSCULAR | Status: DC | PRN
  Administered 2013-11-21: 4 mg via INTRAVENOUS

## 2013-11-21 MED ORDER — LACTATED RINGERS IV SOLN
INTRAVENOUS | Status: DC
  Administered 2013-11-21: 09:00:00 via INTRAVENOUS

## 2013-11-21 MED ORDER — ONDANSETRON HCL 4 MG/2ML IJ SOLN
INTRAMUSCULAR | Status: AC
  Filled 2013-11-21: qty 2

## 2013-11-21 MED ORDER — MIDAZOLAM HCL 2 MG/2ML IJ SOLN
INTRAMUSCULAR | Status: AC
  Filled 2013-11-21: qty 2

## 2013-11-21 MED ORDER — FENTANYL CITRATE 0.05 MG/ML IJ SOLN
INTRAMUSCULAR | Status: AC
  Administered 2013-11-21: 100 ug via INTRAVENOUS
  Filled 2013-11-21: qty 2

## 2013-11-21 MED ORDER — FENTANYL CITRATE 0.05 MG/ML IJ SOLN
50.0000 ug | INTRAMUSCULAR | Status: DC | PRN
  Administered 2013-11-21: 100 ug via INTRAVENOUS

## 2013-11-21 MED ORDER — BUPIVACAINE-EPINEPHRINE (PF) 0.25% -1:200000 IJ SOLN
INTRAMUSCULAR | Status: AC
  Filled 2013-11-21: qty 30

## 2013-11-21 MED ORDER — ARTIFICIAL TEARS OP OINT
TOPICAL_OINTMENT | OPHTHALMIC | Status: AC
  Filled 2013-11-21: qty 3.5

## 2013-11-21 MED ORDER — FENTANYL CITRATE 0.05 MG/ML IJ SOLN
INTRAMUSCULAR | Status: AC
  Filled 2013-11-21: qty 5

## 2013-11-21 MED ORDER — ONDANSETRON HCL 4 MG/2ML IJ SOLN: 4.0000 mg | Freq: Four times a day (QID) | INTRAMUSCULAR | Status: DC | PRN

## 2013-11-21 MED ORDER — PROPOFOL 10 MG/ML IV BOLUS
INTRAVENOUS | Status: AC
  Filled 2013-11-21: qty 20

## 2013-11-21 MED ORDER — BUPIVACAINE-EPINEPHRINE PF 0.25-1:200000 % IJ SOLN
INTRAMUSCULAR | Status: DC | PRN
  Administered 2013-11-21: 30 mL via PERINEURAL

## 2013-11-21 MED ORDER — SUCCINYLCHOLINE CHLORIDE 20 MG/ML IJ SOLN
INTRAMUSCULAR | Status: DC | PRN
  Administered 2013-11-21: 120 mg via INTRAVENOUS

## 2013-11-21 MED ORDER — NEOSTIGMINE METHYLSULFATE 1 MG/ML IJ SOLN
INTRAMUSCULAR | Status: DC | PRN
  Administered 2013-11-21: 3 mg via INTRAVENOUS

## 2013-11-21 MED ORDER — LACTATED RINGERS IV SOLN
INTRAVENOUS | Status: DC | PRN
  Administered 2013-11-21 (×2): via INTRAVENOUS

## 2013-11-21 MED ORDER — MIDAZOLAM HCL 5 MG/5ML IJ SOLN
INTRAMUSCULAR | Status: DC | PRN
  Administered 2013-11-21: 2 mg via INTRAVENOUS

## 2013-11-21 SURGICAL SUPPLY — 63 items
BENZOIN TINCTURE PRP APPL 2/3 (GAUZE/BANDAGES/DRESSINGS) ×3 IMPLANT
BLADE SURG 10 STRL SS (BLADE) ×3 IMPLANT
BLADE SURG 11 STRL SS (BLADE) ×3 IMPLANT
BLADE SURG ROTATE 9660 (MISCELLANEOUS) ×3 IMPLANT
CANISTER SUCTION 2500CC (MISCELLANEOUS) ×3 IMPLANT
CLOSURE STERI-STRIP 1/2X4 (GAUZE/BANDAGES/DRESSINGS) ×1
CLOSURE WOUND 1/2 X4 (GAUZE/BANDAGES/DRESSINGS) ×1
CLSR STERI-STRIP ANTIMIC 1/2X4 (GAUZE/BANDAGES/DRESSINGS) ×2 IMPLANT
COVER SURGICAL LIGHT HANDLE (MISCELLANEOUS) ×6 IMPLANT
DRAPE C-ARM 42X72 X-RAY (DRAPES) ×3 IMPLANT
DRAPE C-ARMOR (DRAPES) ×3 IMPLANT
DRAPE INCISE IOBAN 66X45 STRL (DRAPES) ×3 IMPLANT
DRAPE POUCH INSTRU U-SHP 10X18 (DRAPES) ×3 IMPLANT
DRAPE SURG 17X23 STRL (DRAPES) ×9 IMPLANT
DRILL CANNULATED 7.5MM (BIT) ×3 IMPLANT
DURAPREP 26ML APPLICATOR (WOUND CARE) ×3 IMPLANT
ELECT CAUTERY BLADE 6.4 (BLADE) ×3 IMPLANT
ELECT REM PT RETURN 9FT ADLT (ELECTROSURGICAL) ×3
ELECTRODE REM PT RTRN 9FT ADLT (ELECTROSURGICAL) ×1 IMPLANT
GAUZE SPONGE 4X4 16PLY XRAY LF (GAUZE/BANDAGES/DRESSINGS) ×3 IMPLANT
GLOVE BIO SURGEON STRL SZ7 (GLOVE) ×6 IMPLANT
GLOVE BIO SURGEON STRL SZ8 (GLOVE) ×3 IMPLANT
GLOVE BIOGEL PI IND STRL 7.0 (GLOVE) ×2 IMPLANT
GLOVE BIOGEL PI IND STRL 8 (GLOVE) ×1 IMPLANT
GLOVE BIOGEL PI INDICATOR 7.0 (GLOVE) ×4
GLOVE BIOGEL PI INDICATOR 8 (GLOVE) ×2
GOWN STRL REUS W/ TWL LRG LVL3 (GOWN DISPOSABLE) ×2 IMPLANT
GOWN STRL REUS W/ TWL XL LVL3 (GOWN DISPOSABLE) ×1 IMPLANT
GOWN STRL REUS W/TWL LRG LVL3 (GOWN DISPOSABLE) ×4
GOWN STRL REUS W/TWL XL LVL3 (GOWN DISPOSABLE) ×2
K-WIRE 2.4X300 SHARP (WIRE) ×12
KIT BASIN OR (CUSTOM PROCEDURE TRAY) ×3 IMPLANT
KIT ROOM TURNOVER OR (KITS) ×3 IMPLANT
KWIRE 2.4X300 SHARP (WIRE) ×4 IMPLANT
MANIFOLD NEPTUNE II (INSTRUMENTS) ×3 IMPLANT
NEEDLE 22X1 1/2 (OR ONLY) (NEEDLE) ×3 IMPLANT
NEEDLE HYPO 25GX1X1/2 BEV (NEEDLE) ×3 IMPLANT
NS IRRIG 1000ML POUR BTL (IV SOLUTION) ×3 IMPLANT
PACK UNIVERSAL I (CUSTOM PROCEDURE TRAY) ×3 IMPLANT
PAD ARMBOARD 7.5X6 YLW CONV (MISCELLANEOUS) ×6 IMPLANT
PENCIL BUTTON HOLSTER BLD 10FT (ELECTRODE) ×3 IMPLANT
PUTTY DBX 1CC (Putty) ×6 IMPLANT
PUTTY DBX 1CC DEPUY (Putty) ×2 IMPLANT
SCREW SI-LOK 10MM HA SL 40MM (Screw) ×3 IMPLANT
SCREW SI-LOK 10MM HA SL 55MM (Screw) ×6 IMPLANT
SPONGE GAUZE 4X4 12PLY (GAUZE/BANDAGES/DRESSINGS) ×3 IMPLANT
SPONGE GAUZE 4X4 12PLY STER LF (GAUZE/BANDAGES/DRESSINGS) ×3 IMPLANT
SPONGE LAP 18X18 X RAY DECT (DISPOSABLE) ×3 IMPLANT
STAPLER VISISTAT 35W (STAPLE) ×3 IMPLANT
STRIP CLOSURE SKIN 1/2X4 (GAUZE/BANDAGES/DRESSINGS) ×2 IMPLANT
SUT MNCRL AB 4-0 PS2 18 (SUTURE) ×3 IMPLANT
SUT VIC AB 0 CT1 18XCR BRD 8 (SUTURE) ×1 IMPLANT
SUT VIC AB 0 CT1 8-18 (SUTURE) ×2
SUT VIC AB 2-0 CT2 18 VCP726D (SUTURE) ×3 IMPLANT
SYR BULB IRRIGATION 50ML (SYRINGE) ×3 IMPLANT
SYR CONTROL 10ML LL (SYRINGE) ×3 IMPLANT
TAPE CLOTH SURG 4X10 WHT LF (GAUZE/BANDAGES/DRESSINGS) ×3 IMPLANT
TOWEL OR 17X24 6PK STRL BLUE (TOWEL DISPOSABLE) ×3 IMPLANT
TOWEL OR 17X26 10 PK STRL BLUE (TOWEL DISPOSABLE) ×6 IMPLANT
TUBE CONNECTING 12'X1/4 (SUCTIONS) ×1
TUBE CONNECTING 12X1/4 (SUCTIONS) ×2 IMPLANT
WATER STERILE IRR 1000ML POUR (IV SOLUTION) ×3 IMPLANT
YANKAUER SUCT BULB TIP NO VENT (SUCTIONS) ×3 IMPLANT

## 2013-11-21 NOTE — Progress Notes (Signed)
C/O pain in buttocks at a level 9. Dr Marcie Bal notified and order received. And given with stated relief

## 2013-11-21 NOTE — Transfer of Care (Signed)
Immediate Anesthesia Transfer of Care Note  Patient: [name]  Procedure(s) Performed: Procedure(s) with comments: RIGHT SACROILIAC JOINT FUSION (Right) - Right sided sacroiliac joint fusion  Patient Location: PACU  Anesthesia Type:General  Level of Consciousness: awake, alert , oriented and patient cooperative  Airway & Oxygen Therapy: Patient Spontanous Breathing and Patient connected to face mask oxygen  Post-op Assessment: Report given to PACU RN and Post -op Vital signs reviewed and stable  Post vital signs: Reviewed  Complications: No apparent anesthesia complications

## 2013-11-21 NOTE — Discharge Instructions (Signed)

## 2013-11-21 NOTE — Anesthesia Procedure Notes (Signed)
Procedure Name: Intubation Date/Time: 11/21/2013 11:47 AM Performed by: Jenne Campus Pre-anesthesia Checklist: Patient identified, Emergency Drugs available, Suction available, Patient being monitored and Timeout performed Patient Re-evaluated:Patient Re-evaluated prior to inductionOxygen Delivery Method: Circle system utilized Preoxygenation: Pre-oxygenation with 100% oxygen Intubation Type: IV induction Laryngoscope Size: Miller and 3 Grade View: Grade I Tube type: Oral Tube size: 7.5 mm Number of attempts: 1 Airway Equipment and Method: Stylet Placement Confirmation: ETT inserted through vocal cords under direct vision,  positive ETCO2,  CO2 detector and breath sounds checked- equal and bilateral Secured at: 23 cm Tube secured with: Tape Dental Injury: Teeth and Oropharynx as per pre-operative assessment

## 2013-11-21 NOTE — Progress Notes (Signed)
Orthopedic Tech Progress Note Patient Details:  [name] 1971-02-06 [id number]  Ortho Devices Type of Ortho Device: Crutches Ortho Device/Splint Interventions: Ordered;Adjustment   Braulio Bosch 11/21/2013, 3:31 PM

## 2013-11-21 NOTE — Anesthesia Postprocedure Evaluation (Signed)
Anesthesia Post Note  Patient: [name]  Procedure(s) Performed: Procedure(s) (LRB): RIGHT SACROILIAC JOINT FUSION (Right)  Anesthesia type: General  Patient location: PACU  Post pain: Pain level controlled and Adequate analgesia  Post assessment: Post-op Vital signs reviewed, Patient's Cardiovascular Status Stable, Respiratory Function Stable, Patent Airway and Pain level controlled  Last Vitals:  Filed Vitals:   11/21/13 1445  BP: 148/74  Pulse: 95  Temp:   Resp: 19    Post vital signs: Reviewed and stable  Level of consciousness: awake, alert  and oriented  Complications: No apparent anesthesia complications

## 2013-11-21 NOTE — Anesthesia Preprocedure Evaluation (Addendum)
Anesthesia Evaluation  Patient identified by MRN, date of birth, ID band Patient awake    Reviewed: Allergy & Precautions, H&P , NPO status , Patient's Chart, lab work & pertinent test results  Airway Mallampati: III TM Distance: >3 FB Neck ROM: full    Dental  (+) Teeth Intact, Dental Advisory Given   Pulmonary shortness of breath, sleep apnea , former smoker,  breath sounds clear to auscultation        Cardiovascular hypertension, Pt. on medications Rhythm:Regular Rate:Normal     Neuro/Psych Anxiety Depression    GI/Hepatic GERD-  ,  Endo/Other  Morbid obesity  Renal/GU      Musculoskeletal  (+) Arthritis -,   Abdominal   Peds  Hematology   Anesthesia Other Findings   Reproductive/Obstetrics                          Anesthesia Physical Anesthesia Plan  ASA: II  Anesthesia Plan: General   Post-op Pain Management:    Induction: Intravenous  Airway Management Planned: Oral ETT  Additional Equipment:   Intra-op Plan:   Post-operative Plan: Extubation in OR  Informed Consent: I have reviewed the patients History and Physical, chart, labs and discussed the procedure including the risks, benefits and alternatives for the proposed anesthesia with the patient or authorized representative who has indicated his/her understanding and acceptance.     Plan Discussed with: CRNA, Anesthesiologist and Surgeon  Anesthesia Plan Comments:         Anesthesia Quick Evaluation

## 2013-11-22 NOTE — Op Note (Signed)
NAMEMARCEL, [name]             ACCOUNT NO.:  [id number]  MEDICAL RECORD NO.:  [id number]  LOCATION:  [facility]                         FACILITY:  [place]  PHYSICIAN:  Phylliss Bob, MD      DATE OF BIRTH:  1970/10/19  DATE OF PROCEDURE:  11/21/2013 DATE OF DISCHARGE:  11/21/2013                              OPERATIVE REPORT   PREOPERATIVE DIAGNOSIS:  Right-sided sacroiliac joint dysfunction.  POSTOPERATIVE DIAGNOSIS:  Right-sided sacroiliac joint dysfunction.  PROCEDURE:  Right sacroiliac joint fusion using the SI-LOK sacroiliac joint fusion system.  SURGEON:  Phylliss Bob, MD  ASSISTANT:  Ms. Pricilla Holm, [facility]  ANESTHESIA:  General endotracheal anesthesia.  COMPLICATIONS:  None.  DISPOSITION:  Stable.  ESTIMATED BLOOD LOSS:  Minimal.  INDICATIONS FOR PROCEDURE:  Briefly, [name] is a pleasant 43 year old male, who did present to me with severe and ongoing pain at the right side of his low back.  The patient is status post L4-S1 fusion. He did get temporary relief with injections, and he did also have a radiofrequency ablation procedure to address his right sacroiliac joint, however, he did continue to have ongoing pain.  We therefore did discuss proceeding with a right sacroiliac joint fusion procedure.  The patient did elect to proceed.  OPERATIVE DETAILS:  On November 21, 2013, patient was brought to surgery and general endotracheal anesthesia was administered.  The patient was placed prone on a well-padded flat Jackson bed.  Gell rolls were placed on the patient's chest and hips.  Antibiotics were given and a time-out procedure was performed.  The right buttock was prepped and draped in usual sterile fashion.  A 3 cm incision was made overlying the posterior border of the sacrum on the right side.  I then advanced 3 guidewires across the sacroiliac joint using lateral inlet and outlet fluoroscopy. One guidewire was placed in line with the S1 foramen, one was  just beneath it, and the third was in line with the S2 foramen.  I then drilled over the guidewires from cephalad to caudad, a 10 x 55 mm screw, 10 x 55 mm screw, and a 10 x 40 mm screw was placed.  DBX putty was placed in the window of the slotted cannulated screw.  The guidewires were then removed.  I was very pleased with the radiographic appearance on lateral inlet and outlet radiographs.  The wound was then copiously irrigated with 1 L of normal saline.  The fascia was closed using 0 Vicryl.  The skin was closed using 3-0 Monocryl.  Benzoin and Steri- Strips were applied followed by sterile dressing.  All instrument counts were correct at the termination of the procedure.  Pricilla Holm was my assistant throughout surgery and aided in retraction, suctioning and closure.     Phylliss Bob, MD     MD/MEDQ  D:  11/21/2013  T:  11/22/2013  Job:  [id number]  cc:   Walker Kehr, MD Herminio Commons. Hardin Negus, M.D.

## 2013-11-25 ENCOUNTER — Encounter (HOSPITAL_COMMUNITY): Payer: Self-pay | Admitting: Orthopedic Surgery

## 2014-01-03 ENCOUNTER — Other Ambulatory Visit: Payer: Self-pay | Admitting: Orthopedic Surgery

## 2014-01-09 ENCOUNTER — Emergency Department (HOSPITAL_COMMUNITY): Payer: 59

## 2014-01-09 ENCOUNTER — Emergency Department (HOSPITAL_COMMUNITY)
Admission: EM | Admit: 2014-01-09 | Discharge: 2014-01-09 | Disposition: A | Discharge status: Home / Self Care | Payer: 59 | Attending: Emergency Medicine | Admitting: Emergency Medicine

## 2014-01-09 ENCOUNTER — Encounter (HOSPITAL_COMMUNITY): Payer: Self-pay | Admitting: Emergency Medicine

## 2014-01-09 DIAGNOSIS — R6883 Chills (without fever): Secondary | ICD-10-CM | POA: Insufficient documentation

## 2014-01-09 DIAGNOSIS — Z8701 Personal history of pneumonia (recurrent): Secondary | ICD-10-CM | POA: Insufficient documentation

## 2014-01-09 DIAGNOSIS — F411 Generalized anxiety disorder: Secondary | ICD-10-CM | POA: Insufficient documentation

## 2014-01-09 DIAGNOSIS — R197 Diarrhea, unspecified: Secondary | ICD-10-CM | POA: Insufficient documentation

## 2014-01-09 DIAGNOSIS — F3289 Other specified depressive episodes: Secondary | ICD-10-CM | POA: Insufficient documentation

## 2014-01-09 DIAGNOSIS — F329 Major depressive disorder, single episode, unspecified: Secondary | ICD-10-CM | POA: Insufficient documentation

## 2014-01-09 DIAGNOSIS — G8929 Other chronic pain: Secondary | ICD-10-CM | POA: Insufficient documentation

## 2014-01-09 DIAGNOSIS — G4730 Sleep apnea, unspecified: Secondary | ICD-10-CM | POA: Insufficient documentation

## 2014-01-09 DIAGNOSIS — Z87891 Personal history of nicotine dependence: Secondary | ICD-10-CM | POA: Insufficient documentation

## 2014-01-09 DIAGNOSIS — R5381 Other malaise: Secondary | ICD-10-CM | POA: Insufficient documentation

## 2014-01-09 DIAGNOSIS — R5383 Other fatigue: Secondary | ICD-10-CM

## 2014-01-09 DIAGNOSIS — K219 Gastro-esophageal reflux disease without esophagitis: Secondary | ICD-10-CM | POA: Insufficient documentation

## 2014-01-09 DIAGNOSIS — R1032 Left lower quadrant pain: Secondary | ICD-10-CM | POA: Insufficient documentation

## 2014-01-09 DIAGNOSIS — Z8739 Personal history of other diseases of the musculoskeletal system and connective tissue: Secondary | ICD-10-CM | POA: Insufficient documentation

## 2014-01-09 DIAGNOSIS — E785 Hyperlipidemia, unspecified: Secondary | ICD-10-CM | POA: Insufficient documentation

## 2014-01-09 DIAGNOSIS — Z862 Personal history of diseases of the blood and blood-forming organs and certain disorders involving the immune mechanism: Secondary | ICD-10-CM | POA: Insufficient documentation

## 2014-01-09 DIAGNOSIS — I10 Essential (primary) hypertension: Secondary | ICD-10-CM | POA: Insufficient documentation

## 2014-01-09 DIAGNOSIS — Z79899 Other long term (current) drug therapy: Secondary | ICD-10-CM | POA: Insufficient documentation

## 2014-01-09 DIAGNOSIS — R1013 Epigastric pain: Secondary | ICD-10-CM | POA: Insufficient documentation

## 2014-01-09 LAB — CBC
HCT: 48.4 % (ref 39.0–52.0)
HEMOGLOBIN: 17.6 g/dL — AB (ref 13.0–17.0)
MCH: 30.2 pg (ref 26.0–34.0)
MCHC: 36.4 g/dL — ABNORMAL HIGH (ref 30.0–36.0)
MCV: 83 fL (ref 78.0–100.0)
PLATELETS: 265 10*3/uL (ref 150–400)
RBC: 5.83 MIL/uL — AB (ref 4.22–5.81)
RDW: 12.1 % (ref 11.5–15.5)
WBC: 13.7 10*3/uL — AB (ref 4.0–10.5)

## 2014-01-09 LAB — URINALYSIS, ROUTINE W REFLEX MICROSCOPIC
GLUCOSE, UA: NEGATIVE mg/dL
Ketones, ur: NEGATIVE mg/dL
LEUKOCYTES UA: NEGATIVE
Nitrite: NEGATIVE
PH: 6 (ref 5.0–8.0)
Protein, ur: NEGATIVE mg/dL
Specific Gravity, Urine: 1.029 (ref 1.005–1.030)
Urobilinogen, UA: 0.2 mg/dL (ref 0.0–1.0)

## 2014-01-09 LAB — URINE MICROSCOPIC-ADD ON

## 2014-01-09 LAB — COMPREHENSIVE METABOLIC PANEL
ALT: 14 U/L (ref 0–53)
AST: 17 U/L (ref 0–37)
Albumin: 4.8 g/dL (ref 3.5–5.2)
Alkaline Phosphatase: 149 U/L — ABNORMAL HIGH (ref 39–117)
BILIRUBIN TOTAL: 0.6 mg/dL (ref 0.3–1.2)
BUN: 15 mg/dL (ref 6–23)
CHLORIDE: 98 meq/L (ref 96–112)
CO2: 27 meq/L (ref 19–32)
CREATININE: 0.99 mg/dL (ref 0.50–1.35)
Calcium: 9.6 mg/dL (ref 8.4–10.5)
GFR calc Af Amer: >90 mL/min (ref >90)
GLUCOSE: 95 mg/dL (ref 70–99)
Potassium: 3.9 mEq/L (ref 3.7–5.3)
Sodium: 138 mEq/L (ref 137–147)
Total Protein: 8.3 g/dL (ref 6.0–8.3)

## 2014-01-09 LAB — LIPASE, BLOOD: Lipase: 24 U/L (ref 11–59)

## 2014-01-09 LAB — CLOSTRIDIUM DIFFICILE BY PCR: CDIFFPCR: NEGATIVE

## 2014-01-09 LAB — I-STAT CG4 LACTIC ACID, ED: Lactic Acid, Venous: 0.92 mmol/L (ref 0.5–2.2)

## 2014-01-09 MED ORDER — SODIUM CHLORIDE 0.9 % IV BOLUS (SEPSIS)
1000.0000 mL | Freq: Once | INTRAVENOUS | Status: AC
  Administered 2014-01-09: 1000 mL via INTRAVENOUS

## 2014-01-09 MED ORDER — ONDANSETRON 4 MG PO TBDP: ORAL_TABLET | ORAL | Status: DC

## 2014-01-09 MED ORDER — ONDANSETRON HCL 4 MG/2ML IJ SOLN
4.0000 mg | Freq: Once | INTRAMUSCULAR | Status: AC
  Administered 2014-01-09: 4 mg via INTRAVENOUS
  Filled 2014-01-09: qty 2

## 2014-01-09 MED ORDER — IOHEXOL 300 MG/ML  SOLN
100.0000 mL | Freq: Once | INTRAMUSCULAR | Status: AC | PRN
  Administered 2014-01-09: 100 mL via INTRAVENOUS

## 2014-01-09 NOTE — ED Provider Notes (Signed)
CSN: [id number]     Arrival date & time 01/09/14  1303 History   First MD Initiated Contact with Patient 01/09/14 1318     Chief Complaint  Patient presents with  . Diarrhea  . Weakness     (Consider location/radiation/quality/duration/timing/severity/associated sxs/prior Treatment) Patient is a 43 y.o. male presenting with diarrhea and weakness. The history is provided by the patient.  Diarrhea Quality:  Watery Severity:  Severe Onset quality:  Gradual Duration:  5 days Timing:  Constant Progression:  Unchanged Relieved by:  Nothing Worsened by:  Nothing tried Associated symptoms: abdominal pain (epigastric, LLQ) and chills   Associated symptoms: no recent cough, no fever, no headaches, no myalgias, no URI and no vomiting   Risk factors: no recent antibiotic use, no sick contacts, no suspicious food intake and no travel to endemic areas   Weakness Associated symptoms include abdominal pain (epigastric, LLQ). Pertinent negatives include no chest pain, no headaches and no shortness of breath.    Past Medical History  Diagnosis Date  . Hyperlipidemia   . Hypertension     Dr. Allayne Butcher, [place] [facility] family physicians  . Arthritis     back area  . Pancreatitis     hx of  . Depression     on prozac  . Sleep apnea     sleep study approx 18 months ago, uses cpap  . Chronic back pain   . Dysrhythmia     a-fib, sees Dr. Shirlee More, [facility] cardiology  . Shortness of breath     WIth exertion  . Pneumonia     hx of  2005ish  . Anxiety   . GERD (gastroesophageal reflux disease)     uses baking soda  . Thrombocytopenia, idiopathic     age of 32 months  . Complication of anesthesia     noticed some memory loss after surgery 07/18/12   Past Surgical History  Procedure Laterality Date  . Cholecystectomy    . Hernia repair Left     inguinal  . Back surgery      x2 fusions, L5-S1, L4-L5  . Tonsillectomy    . Appendectomy      56 months of age  . Diagnostic  laparoscopy      exploratory lab, abdominal  . Vasectomy    . Refractive surgery    . Spinal cord stimulator implant  07/18/2012  . Spinal cord stimulator insertion  07/18/2012    Procedure: LUMBAR SPINAL CORD STIMULATOR INSERTION;  Surgeon: Melina Schools, MD;  Location: [place];  Service: Orthopedics;  Laterality: N/A;  trial spinal cord stimulator implant   . Spinal cord stimulator removal  07/26/2012    Procedure: LUMBAR SPINAL CORD STIMULATOR REMOVAL;  Surgeon: Melina Schools, MD;  Location: [place];  Service: Orthopedics;  Laterality: N/A;  REMOVAL OF TRIAL LEAD OR CONVERSION TO PERM SPINAL CORD STIMULATOR IMPLANT  . Sacroiliac joint fusion Right 11/21/2013    Procedure: RIGHT SACROILIAC JOINT FUSION;  Surgeon: Sinclair Ship, MD;  Location: [place];  Service: Orthopedics;  Laterality: Right;  Right sided sacroiliac joint fusion   History reviewed. No pertinent family history. History  Substance Use Topics  . Smoking status: Former Smoker    Quit date: 08/08/2009  . Smokeless tobacco: Former Systems developer     Comment: stopped apprx 2 years ago  . Alcohol Use: No    Review of Systems  Constitutional: Positive for chills and appetite change (decreased). Negative for fever.  Respiratory: Negative for cough,  choking, chest tightness and shortness of breath.   Cardiovascular: Negative for chest pain and leg swelling.  Gastrointestinal: Positive for nausea, abdominal pain (epigastric, LLQ) and diarrhea. Negative for vomiting.  Musculoskeletal: Negative for myalgias.  Neurological: Positive for weakness. Negative for headaches.  All other systems reviewed and are negative.     Allergies  Nexium and Wellbutrin  Home Medications   Prior to Admission medications   Medication Sig Start Date End Date Taking? Authorizing Provider  diazepam (VALIUM) 5 MG tablet Take 5 mg by mouth 2 (two) times daily as needed for muscle spasms.    Yes Historical Provider, MD  FLUoxetine (PROZAC) 20 MG capsule  Take 20 mg by mouth at bedtime. Give with the 40 mg to equal the 60 mg dose   Yes Historical Provider, MD  FLUoxetine (PROZAC) 40 MG capsule Take 40 mg by mouth at bedtime. Give with the 20 mg to equal 60 mg   Yes Historical Provider, MD  lisinopril (PRINIVIL,ZESTRIL) 20 MG tablet Take 20 mg by mouth at bedtime.   Yes Historical Provider, MD  methocarbamol (ROBAXIN) 750 MG tablet Take 750 mg by mouth 3 (three) times daily as needed for muscle spasms.    Yes Historical Provider, MD  omega-3 acid ethyl esters (LOVAZA) 1 G capsule Take 3 g by mouth every morning.   Yes Historical Provider, MD  Oxycodone HCl 20 MG TABS Take 20 mg by mouth every 6 (six) hours as needed (pain).    Yes Historical Provider, MD  Verapamil HCl CR 300 MG CP24 Take 300 mg by mouth at bedtime.   Yes Historical Provider, MD   BP 112/79  Pulse 96  Temp(Src) 98.2 F (36.8 C) (Oral)  Resp 20  SpO2 98% Physical Exam  Nursing note and vitals reviewed. Constitutional: He is oriented to person, place, and time. He appears well-developed and well-nourished. No distress.  HENT:  Head: Normocephalic and atraumatic.  Mouth/Throat: Oropharynx is clear and moist. No oropharyngeal exudate.  Eyes: EOM are normal. Pupils are equal, round, and reactive to light.  Neck: Normal range of motion. Neck supple.  Cardiovascular: Normal rate and regular rhythm.  Exam reveals no friction rub.   No murmur heard. Pulmonary/Chest: Effort normal and breath sounds normal. No respiratory distress. He has no wheezes. He has no rales.  Abdominal: Soft. He exhibits no distension. There is no tenderness. There is no rebound.  Musculoskeletal: Normal range of motion. He exhibits no edema.  Neurological: He is alert and oriented to person, place, and time.  Skin: No rash noted. He is not diaphoretic.    ED Course  Procedures (including critical care time) Labs Review Labs Reviewed  CBC  COMPREHENSIVE METABOLIC PANEL  LIPASE, BLOOD  URINALYSIS,  ROUTINE W REFLEX MICROSCOPIC  I-STAT CG4 LACTIC ACID, ED  I-STAT CG4 LACTIC ACID, ED    Imaging Review Ct Abdomen Pelvis W Contrast  01/09/2014   CLINICAL DATA:  Left-sided upper abdominal pain with history of pancreatitis  EXAM: CT ABDOMEN AND PELVIS WITH CONTRAST  TECHNIQUE: Multidetector CT imaging of the abdomen and pelvis was performed using the standard protocol following bolus administration of intravenous contrast.  CONTRAST:  179mL OMNIPAQUE IOHEXOL 300 MG/ML SOLN intravenously; the patient did not receive oral contrast at the request of his physician.  COMPARISON:  None.  FINDINGS: The gallbladder is surgically absent. The liver exhibits a 1 cm diameter right lobe hypodensity with Hounsfield measurement of -20 consistent with a cyst. There is no intrahepatic ductal  dilation. The pancreas is normal in density and contour with no evidence of acute inflammation. No pseudocyst or parenchymal calcification is demonstrated. The spleen, adrenal glands, and kidneys are normal in appearance.  The stomach is nondistended. The duodenum and jejunum and ileum exhibit no acute abnormalities. There is mildly increased density within the deep portion of the mesentery demonstrated on images 33 through 56. There are numerous normal sized to borderline enlarged lymph nodes present here. The colon is partially distended with gas and fluid. There is no evidence of colitis nor of diverticulitis. The appendix is surgically absent. Within the pelvis the urinary bladder and prostate gland and seminal vesicles are normal. The patient has undergone previous left inguinal hernia repair.  The patient has undergone previous fusion procedures at L4-5 and L5-S1. There are metallic screws across the right SI joint. There is a nerve stimulator generator present over the left upper gluteal region with the electrodes extending to the level of the spinous processes of the lower thoracic spine.  The lung bases are clear.  IMPRESSION: 1.  There is no acute hepatobiliary nor acute urinary tract abnormality. 2. There is no evidence of bowel obstruction or ileus or inflammation. There is increased density within the mesenteric fat inferior to the pancreas associated with borderline enlarged lymph nodes. This may reflect mild inflammation. There is no suspicious mass. No lymphadenopathy is demonstrated elsewhere. 3. There is no CT evidence of acute or chronic pancreatitis.   Electronically Signed   By: David  Martinique   On: 01/09/2014 15:19     EKG Interpretation None      MDM   Final diagnoses:  Diarrhea    38M presents with diarrhea. Hx of similar - due to pancreatitis - resulted in admission. Normotensive here, no hypoxia. Diarrhea for past 5 days - nonbloody. No vomiting, but nausea resulting in decreased PO intake. No antibiotics, no travel. Recent surgery for his back. AFVSS here. States abdominal pain alos, but nontender on exam. MMM, normal capillary refill. Will check labs. Will hold off on CT at this time. C Diff sent since recently in the hospital. All labs ok other than WBC of around 14. CT ok. Stable for discharge, patient feeilng better, perking up. Will give Zofran.    Osvaldo Shipper, MD 01/09/14 1537

## 2014-01-09 NOTE — ED Notes (Signed)
Patient up to bathroom to get stool sample.

## 2014-01-09 NOTE — Discharge Instructions (Signed)
Diarrhea Diarrhea is frequent loose and watery bowel movements. It can cause you to feel weak and dehydrated. Dehydration can cause you to become tired and thirsty, have a dry mouth, and have decreased urination that often is dark yellow. Diarrhea is a sign of another problem, most often an infection that will not last long. In most cases, diarrhea typically lasts 2 3 days. However, it can last longer if it is a sign of something more serious. It is important to treat your diarrhea as directed by your caregive to lessen or prevent future episodes of diarrhea. CAUSES  Some common causes include:  Gastrointestinal infections caused by viruses, bacteria, or parasites.  Food poisoning or food allergies.  Certain medicines, such as antibiotics, chemotherapy, and laxatives.  Artificial sweeteners and fructose.  Digestive disorders. HOME CARE INSTRUCTIONS  Ensure adequate fluid intake (hydration): have 1 cup (8 oz) of fluid for each diarrhea episode. Avoid fluids that contain simple sugars or sports drinks, fruit juices, whole milk products, and sodas. Your urine should be clear or pale yellow if you are drinking enough fluids. Hydrate with an oral rehydration solution that you can purchase at pharmacies, retail stores, and online. You can prepare an oral rehydration solution at home by mixing the following ingredients together:    tsp table salt.   tsp baking soda.   tsp salt substitute containing potassium chloride.  1  tablespoons sugar.  1 L (34 oz) of water.  Certain foods and beverages may increase the speed at which food moves through the gastrointestinal (GI) tract. These foods and beverages should be avoided and include:  Caffeinated and alcoholic beverages.  High-fiber foods, such as raw fruits and vegetables, nuts, seeds, and whole grain breads and cereals.  Foods and beverages sweetened with sugar alcohols, such as xylitol, sorbitol, and mannitol.  Some foods may be well  tolerated and may help thicken stool including:  Starchy foods, such as rice, toast, pasta, low-sugar cereal, oatmeal, grits, baked potatoes, crackers, and bagels.  Bananas.  Applesauce.  Add probiotic-rich foods to help increase healthy bacteria in the GI tract, such as yogurt and fermented milk products.  Wash your hands well after each diarrhea episode.  Only take over-the-counter or prescription medicines as directed by your caregiver.  Take a warm bath to relieve any burning or pain from frequent diarrhea episodes. SEEK IMMEDIATE MEDICAL CARE IF:   You are unable to keep fluids down.  You have persistent vomiting.  You have blood in your stool, or your stools are black and tarry.  You do not urinate in 6 8 hours, or there is only a small amount of very dark urine.  You have abdominal pain that increases or localizes.  You have weakness, dizziness, confusion, or lightheadedness.  You have a severe headache.  Your diarrhea gets worse or does not get better.  You have a fever or persistent symptoms for more than 2 3 days.  You have a fever and your symptoms suddenly get worse. MAKE SURE YOU:   Understand these instructions.  Will watch your condition.  Will get help right away if you are not doing well or get worse. Document Released: 07/15/2002 Document Revised: 07/11/2012 Document Reviewed: 04/01/2012 ExitCare Patient Information 2014 [employer].  

## 2014-01-09 NOTE — ED Notes (Signed)
Pt states he has hx of pancreatitis and is having same symptoms since Sunday night.  Diarrhea, abdominal discomfort, belching, loss of appetite.

## 2014-01-15 ENCOUNTER — Encounter (HOSPITAL_COMMUNITY)
Admission: RE | Admit: 2014-01-15 | Discharge: 2014-01-15 | Disposition: A | Discharge status: Home / Self Care | Payer: 59 | Source: Ambulatory Visit | Attending: Orthopedic Surgery | Admitting: Orthopedic Surgery

## 2014-01-15 ENCOUNTER — Encounter (HOSPITAL_COMMUNITY): Payer: Self-pay

## 2014-01-15 DIAGNOSIS — Z01812 Encounter for preprocedural laboratory examination: Secondary | ICD-10-CM | POA: Insufficient documentation

## 2014-01-15 HISTORY — DX: Disease of blood and blood-forming organs, unspecified: D75.9

## 2014-01-15 LAB — URINALYSIS, ROUTINE W REFLEX MICROSCOPIC
BILIRUBIN URINE: NEGATIVE
GLUCOSE, UA: NEGATIVE mg/dL
Hgb urine dipstick: NEGATIVE
KETONES UR: NEGATIVE mg/dL
Leukocytes, UA: NEGATIVE
Nitrite: NEGATIVE
Protein, ur: NEGATIVE mg/dL
Specific Gravity, Urine: 1.017 (ref 1.005–1.030)
Urobilinogen, UA: 0.2 mg/dL (ref 0.0–1.0)
pH: 8 (ref 5.0–8.0)

## 2014-01-15 LAB — CBC WITH DIFFERENTIAL/PLATELET
BASOS ABS: 0 10*3/uL (ref 0.0–0.1)
Basophils Relative: 0 % (ref 0–1)
EOS PCT: 3 % (ref 0–5)
Eosinophils Absolute: 0.2 10*3/uL (ref 0.0–0.7)
HCT: 47.1 % (ref 39.0–52.0)
Hemoglobin: 16.7 g/dL (ref 13.0–17.0)
LYMPHS ABS: 3 10*3/uL (ref 0.7–4.0)
Lymphocytes Relative: 39 % (ref 12–46)
MCH: 30.2 pg (ref 26.0–34.0)
MCHC: 35.5 g/dL (ref 30.0–36.0)
MCV: 85.2 fL (ref 78.0–100.0)
MONO ABS: 0.6 10*3/uL (ref 0.1–1.0)
Monocytes Relative: 7 % (ref 3–12)
Neutro Abs: 3.8 10*3/uL (ref 1.7–7.7)
Neutrophils Relative %: 51 % (ref 43–77)
Platelets: 263 10*3/uL (ref 150–400)
RBC: 5.53 MIL/uL (ref 4.22–5.81)
RDW: 12.3 % (ref 11.5–15.5)
WBC: 7.6 10*3/uL (ref 4.0–10.5)

## 2014-01-15 LAB — COMPREHENSIVE METABOLIC PANEL
ALT: 20 U/L (ref 0–53)
AST: 23 U/L (ref 0–37)
Albumin: 4.4 g/dL (ref 3.5–5.2)
Alkaline Phosphatase: 128 U/L — ABNORMAL HIGH (ref 39–117)
BUN: 8 mg/dL (ref 6–23)
CALCIUM: 9.8 mg/dL (ref 8.4–10.5)
CO2: 29 meq/L (ref 19–32)
CREATININE: 0.89 mg/dL (ref 0.50–1.35)
Chloride: 100 mEq/L (ref 96–112)
GFR calc Af Amer: >90 mL/min (ref >90)
GLUCOSE: 96 mg/dL (ref 70–99)
Potassium: 4.6 mEq/L (ref 3.7–5.3)
Sodium: 142 mEq/L (ref 137–147)
Total Bilirubin: 0.3 mg/dL (ref 0.3–1.2)
Total Protein: 7.9 g/dL (ref 6.0–8.3)

## 2014-01-15 LAB — TYPE AND SCREEN
ABO/RH(D): B NEG
Antibody Screen: NEGATIVE

## 2014-01-15 LAB — APTT: aPTT: 28 seconds (ref 24–37)

## 2014-01-15 LAB — SURGICAL PCR SCREEN
MRSA, PCR: NEGATIVE
Staphylococcus aureus: NEGATIVE

## 2014-01-15 LAB — PROTIME-INR
INR: 1.05 (ref 0.00–1.49)
Prothrombin Time: 13.5 seconds (ref 11.6–15.2)

## 2014-01-15 NOTE — Pre-Procedure Instructions (Addendum)
[name]  01/15/2014   Your procedure is scheduled on:  01/22/14  Report to [facility] short stay admitting at please call [phone]  AM. Of surgery between 7 and 8  Call this number if you have problems the morning of surgery: [phone]   Remember:   Do not eat food or drink liquids after midnight.   Take these medicines the morning of surgery with A SIP OF WATER: valium, pain med, robaxin, zofran if needed  TAKE ALL MEDS AS ORDERD UNTIL DAY OF SURGERY EXCEPT AS INSTRUCTED BELOW OR PER DR  STOP all herbel meds, nsaids (aleve,naproxen,advil,ibuprofen) 5 days prior to surgery(01/17/14) INCLUDING VITAMINS, ASPIRIN, FISH OIL   Do not wear jewelry, make-up or nail polish.  Do not wear lotions, powders, or perfumes. You may wear deodorant.  Do not shave 48 hours prior to surgery. Men may shave face and neck.  Do not bring valuables to the hospital.  [facility] is not responsible                  for any belongings or valuables.               Contacts, dentures or bridgework may not be worn into surgery.  Leave suitcase in the car. After surgery it may be brought to your room.  For patients admitted to the hospital, discharge time is determined by your                treatment team.               Patients discharged the day of surgery will not be allowed to drive  home.  Name and phone number of your driver:   Special Instructions:  Special Instructions: [place] - Preparing for Surgery  Before surgery, you can play an important role.  Because skin is not sterile, your skin needs to be as free of germs as possible.  You can reduce the number of germs on you skin by washing with CHG (chlorahexidine gluconate) soap before surgery.  CHG is an antiseptic cleaner which kills germs and bonds with the skin to continue killing germs even after washing.  Please DO NOT use if you have an allergy to CHG or antibacterial soaps.  If your skin becomes reddened/irritated stop using the CHG and  inform your nurse when you arrive at Short Stay.  Do not shave (including legs and underarms) for at least 48 hours prior to the first CHG shower.  You may shave your face.  Please follow these instructions carefully:   1.  Shower with CHG Soap the night before surgery and the morning of Surgery.  2.  If you choose to wash your hair, wash your hair first as usual with your normal shampoo.  3.  After you shampoo, rinse your hair and body thoroughly to remove the Shampoo.  4.  Use CHG as you would any other liquid soap.  You can apply chg directly  to the skin and wash gently with scrungie or a clean washcloth.  5.  Apply the CHG Soap to your body ONLY FROM THE NECK DOWN.  Do not use on open wounds or open sores.  Avoid contact with your eyes ears, mouth and genitals (private parts).  Wash genitals (private parts)       with your normal soap.  6.  Wash thoroughly, paying special attention to the area where your surgery will be performed.  7.  Thoroughly rinse your  body with warm water from the neck down.  8.  DO NOT shower/wash with your normal soap after using and rinsing off the CHG Soap.  9.  Pat yourself dry with a clean towel.            10.  Wear clean pajamas.            11.  Place clean sheets on your bed the night of your first shower and do not sleep with pets.  Day of Surgery  Do not apply any lotions/deodorants the morning of surgery.  Please wear clean clothes to the hospital/surgery center.   Please read over the following fact sheets that you were given: Pain Booklet, Coughing and Deep Breathing, Blood Transfusion Information, MRSA Information and Surgical Site Infection Prevention

## 2014-01-15 NOTE — Progress Notes (Addendum)
Echo, stress done as preventive care 5 yrs ago [place] dr brian munley--a fib   req;d

## 2014-01-16 ENCOUNTER — Encounter (HOSPITAL_COMMUNITY): Payer: Self-pay

## 2014-01-16 NOTE — Progress Notes (Signed)
Anesthesia chart review: Patient is a 43 year old male scheduled for left sided SI joint fusion on 01/22/2014 by Dr. Lynann Bologna.  He is s/p right sided SI joint fusion on 11/21/13.  History includes former smoker, hypertension, paroxysmal atrial fibrillation, GERD, ITP at 37 months old, pancreatitis, hyperlipidemia, hypertension, depression, arthritis, anxiety, obstructive sleep apnea with CPAP use, cholecystectomy, left a little hernia repair, spinal cord stimulator. PCP is Dr. Janace Litten.  He was evaluated by cardiologist Dr. Shirlee More with [place] in 2012 due to PAF.  He was diagnosed with OSA then and this was suspected to be the precipitating factor of his afib.  He is now on CPAP.  He was discharged with PRN cardiology follow-up on 04/06/11.  EKG 11/15/13 showed normal sinus rhythm.  Echo on 02/10/11 (scanned under Media tab, Correspondence 07/18/12) showed: Normal LV systolic function. Mild LVH. Mild LAE. Trivial TR, MT.  Chest x-ray on 11/15/2013 showed thoracic stimulator and mid thoracic region. Mild subsegmental atelectasis in the lung bases. Lungs otherwise clear.  Preoperative labs noted.  He tolerated similar surgery just 2 months ago. If no acute changes then I would anticipate that he could proceed as planned.  George Hugh [facility] Short Stay Center/Anesthesiology Phone [phone] 01/16/2014 12:44 PM

## 2014-01-21 MED ORDER — CEFAZOLIN SODIUM 10 G IJ SOLR
3.0000 g | INTRAMUSCULAR | Status: DC
  Filled 2014-01-21: qty 3000

## 2014-01-22 ENCOUNTER — Ambulatory Visit (HOSPITAL_COMMUNITY): Payer: 59

## 2014-01-22 ENCOUNTER — Encounter (HOSPITAL_COMMUNITY)
Admission: RE | Disposition: A | Discharge status: Home / Self Care | Payer: Self-pay | Source: Ambulatory Visit | Attending: Orthopedic Surgery

## 2014-01-22 ENCOUNTER — Encounter (HOSPITAL_COMMUNITY): Payer: 59 | Admitting: Vascular Surgery

## 2014-01-22 ENCOUNTER — Ambulatory Visit (HOSPITAL_COMMUNITY)
Admission: RE | Admit: 2014-01-22 | Discharge: 2014-01-22 | Disposition: A | Discharge status: Home / Self Care | Payer: 59 | Source: Ambulatory Visit | Attending: Orthopedic Surgery | Admitting: Orthopedic Surgery

## 2014-01-22 ENCOUNTER — Ambulatory Visit (HOSPITAL_COMMUNITY): Payer: 59 | Admitting: Anesthesiology

## 2014-01-22 ENCOUNTER — Encounter (HOSPITAL_COMMUNITY): Payer: Self-pay | Admitting: *Deleted

## 2014-01-22 DIAGNOSIS — G4730 Sleep apnea, unspecified: Secondary | ICD-10-CM | POA: Insufficient documentation

## 2014-01-22 DIAGNOSIS — Z7982 Long term (current) use of aspirin: Secondary | ICD-10-CM | POA: Insufficient documentation

## 2014-01-22 DIAGNOSIS — F329 Major depressive disorder, single episode, unspecified: Secondary | ICD-10-CM | POA: Insufficient documentation

## 2014-01-22 DIAGNOSIS — M533 Sacrococcygeal disorders, not elsewhere classified: Secondary | ICD-10-CM | POA: Insufficient documentation

## 2014-01-22 DIAGNOSIS — F3289 Other specified depressive episodes: Secondary | ICD-10-CM | POA: Insufficient documentation

## 2014-01-22 DIAGNOSIS — K219 Gastro-esophageal reflux disease without esophagitis: Secondary | ICD-10-CM | POA: Insufficient documentation

## 2014-01-22 DIAGNOSIS — F411 Generalized anxiety disorder: Secondary | ICD-10-CM | POA: Insufficient documentation

## 2014-01-22 DIAGNOSIS — I10 Essential (primary) hypertension: Secondary | ICD-10-CM | POA: Insufficient documentation

## 2014-01-22 DIAGNOSIS — E785 Hyperlipidemia, unspecified: Secondary | ICD-10-CM | POA: Insufficient documentation

## 2014-01-22 DIAGNOSIS — Z87891 Personal history of nicotine dependence: Secondary | ICD-10-CM | POA: Insufficient documentation

## 2014-01-22 HISTORY — PX: SACROILIAC JOINT FUSION: SHX6088

## 2014-01-22 SURGERY — SACROILIAC JOINT FUSION
Anesthesia: General | Laterality: Left

## 2014-01-22 MED ORDER — FENTANYL CITRATE 0.05 MG/ML IJ SOLN
INTRAMUSCULAR | Status: AC
  Filled 2014-01-22: qty 5

## 2014-01-22 MED ORDER — GLYCOPYRROLATE 0.2 MG/ML IJ SOLN
INTRAMUSCULAR | Status: DC | PRN
  Administered 2014-01-22: 0.2 mg via INTRAVENOUS

## 2014-01-22 MED ORDER — FENTANYL CITRATE 0.05 MG/ML IJ SOLN
INTRAMUSCULAR | Status: DC | PRN
  Administered 2014-01-22: 50 ug via INTRAVENOUS
  Administered 2014-01-22: 200 ug via INTRAVENOUS

## 2014-01-22 MED ORDER — MIDAZOLAM HCL 5 MG/5ML IJ SOLN
INTRAMUSCULAR | Status: DC | PRN
  Administered 2014-01-22: 2 mg via INTRAVENOUS

## 2014-01-22 MED ORDER — PROMETHAZINE HCL 25 MG/ML IJ SOLN: 6.2500 mg | INTRAMUSCULAR | Status: DC | PRN

## 2014-01-22 MED ORDER — LACTATED RINGERS IV SOLN
INTRAVENOUS | Status: DC
  Administered 2014-01-22: 14:00:00 via INTRAVENOUS

## 2014-01-22 MED ORDER — PHENYLEPHRINE HCL 10 MG/ML IJ SOLN
INTRAMUSCULAR | Status: DC | PRN
  Administered 2014-01-22: 80 ug via INTRAVENOUS
  Administered 2014-01-22: 40 ug via INTRAVENOUS
  Administered 2014-01-22 (×2): 80 ug via INTRAVENOUS
  Administered 2014-01-22: 120 ug via INTRAVENOUS

## 2014-01-22 MED ORDER — HYDROMORPHONE HCL PF 1 MG/ML IJ SOLN
INTRAMUSCULAR | Status: AC
  Filled 2014-01-22: qty 1

## 2014-01-22 MED ORDER — LACTATED RINGERS IV SOLN
INTRAVENOUS | Status: DC | PRN
  Administered 2014-01-22 (×2): via INTRAVENOUS

## 2014-01-22 MED ORDER — LIDOCAINE HCL (CARDIAC) 20 MG/ML IV SOLN
INTRAVENOUS | Status: DC | PRN
  Administered 2014-01-22: 80 mg via INTRAVENOUS

## 2014-01-22 MED ORDER — PROPOFOL 10 MG/ML IV BOLUS
INTRAVENOUS | Status: AC
  Filled 2014-01-22: qty 20

## 2014-01-22 MED ORDER — ROCURONIUM BROMIDE 100 MG/10ML IV SOLN
INTRAVENOUS | Status: DC | PRN
  Administered 2014-01-22: 50 mg via INTRAVENOUS

## 2014-01-22 MED ORDER — GLYCOPYRROLATE 0.2 MG/ML IJ SOLN
INTRAMUSCULAR | Status: AC
  Filled 2014-01-22: qty 1

## 2014-01-22 MED ORDER — OXYCODONE HCL 5 MG PO TABS
ORAL_TABLET | ORAL | Status: AC
  Filled 2014-01-22: qty 2

## 2014-01-22 MED ORDER — BUPIVACAINE HCL (PF) 0.25 % IJ SOLN
INTRAMUSCULAR | Status: AC
  Filled 2014-01-22: qty 30

## 2014-01-22 MED ORDER — ONDANSETRON HCL 4 MG/2ML IJ SOLN
INTRAMUSCULAR | Status: DC | PRN
  Administered 2014-01-22: 4 mg via INTRAVENOUS

## 2014-01-22 MED ORDER — POVIDONE-IODINE 7.5 % EX SOLN
Freq: Once | CUTANEOUS | Status: DC
  Filled 2014-01-22: qty 118

## 2014-01-22 MED ORDER — MIDAZOLAM HCL 2 MG/2ML IJ SOLN
INTRAMUSCULAR | Status: AC
  Filled 2014-01-22: qty 2

## 2014-01-22 MED ORDER — OXYCODONE HCL 5 MG PO TABS
10.0000 mg | ORAL_TABLET | Freq: Once | ORAL | Status: AC | PRN
  Administered 2014-01-22: 10 mg via ORAL

## 2014-01-22 MED ORDER — PHENYLEPHRINE 40 MCG/ML (10ML) SYRINGE FOR IV PUSH (FOR BLOOD PRESSURE SUPPORT)
PREFILLED_SYRINGE | INTRAVENOUS | Status: AC
  Filled 2014-01-22: qty 10

## 2014-01-22 MED ORDER — NEOSTIGMINE METHYLSULFATE 10 MG/10ML IV SOLN
INTRAVENOUS | Status: DC | PRN
  Administered 2014-01-22: 2 mg via INTRAVENOUS

## 2014-01-22 MED ORDER — KETOROLAC TROMETHAMINE 30 MG/ML IJ SOLN
INTRAMUSCULAR | Status: AC
  Filled 2014-01-22: qty 1

## 2014-01-22 MED ORDER — CEFAZOLIN SODIUM 10 G IJ SOLR
3.0000 g | INTRAMUSCULAR | Status: DC | PRN
  Administered 2014-01-22: 3 g via INTRAVENOUS

## 2014-01-22 MED ORDER — PROPOFOL 10 MG/ML IV BOLUS
INTRAVENOUS | Status: DC | PRN
  Administered 2014-01-22: 20 mg via INTRAVENOUS
  Administered 2014-01-22: 180 mg via INTRAVENOUS

## 2014-01-22 MED ORDER — HYDROMORPHONE HCL PF 1 MG/ML IJ SOLN
0.2500 mg | INTRAMUSCULAR | Status: DC | PRN
  Administered 2014-01-22 (×4): 0.5 mg via INTRAVENOUS

## 2014-01-22 MED ORDER — KETOROLAC TROMETHAMINE 30 MG/ML IJ SOLN
15.0000 mg | Freq: Once | INTRAMUSCULAR | Status: AC | PRN
  Administered 2014-01-22: 30 mg via INTRAVENOUS

## 2014-01-22 MED ORDER — HYDROMORPHONE HCL PF 1 MG/ML IJ SOLN
INTRAMUSCULAR | Status: AC
  Administered 2014-01-22: 0.5 mg via INTRAVENOUS
  Filled 2014-01-22: qty 1

## 2014-01-22 MED ORDER — DEXAMETHASONE SODIUM PHOSPHATE 4 MG/ML IJ SOLN
INTRAMUSCULAR | Status: DC | PRN
  Administered 2014-01-22: 8 mg via INTRAVENOUS

## 2014-01-22 MED ORDER — ACETAMINOPHEN 325 MG PO TABS: 325.0000 mg | ORAL_TABLET | ORAL | Status: DC | PRN

## 2014-01-22 MED ORDER — OXYCODONE HCL 5 MG/5ML PO SOLN: 5.0000 mg | Freq: Once | ORAL | Status: AC | PRN

## 2014-01-22 MED ORDER — ACETAMINOPHEN 160 MG/5ML PO SOLN: 325.0000 mg | ORAL | Status: DC | PRN

## 2014-01-22 MED ORDER — DEXAMETHASONE SODIUM PHOSPHATE 4 MG/ML IJ SOLN
INTRAMUSCULAR | Status: AC
  Filled 2014-01-22: qty 2

## 2014-01-22 MED ORDER — BUPIVACAINE-EPINEPHRINE (PF) 0.25% -1:200000 IJ SOLN
INTRAMUSCULAR | Status: DC | PRN
  Administered 2014-01-22: 25 mL via PERINEURAL

## 2014-01-22 SURGICAL SUPPLY — 67 items
BENZOIN TINCTURE PRP APPL 2/3 (GAUZE/BANDAGES/DRESSINGS) ×3 IMPLANT
BLADE SURG 10 STRL SS (BLADE) ×3 IMPLANT
BLADE SURG 11 STRL SS (BLADE) ×3 IMPLANT
BLADE SURG ROTATE 9660 (MISCELLANEOUS) IMPLANT
CANISTER SUCTION 2500CC (MISCELLANEOUS) ×3 IMPLANT
CLOSURE STERI-STRIP 1/2X4 (GAUZE/BANDAGES/DRESSINGS) ×1
CLOSURE WOUND 1/2 X4 (GAUZE/BANDAGES/DRESSINGS) ×1
CLSR STERI-STRIP ANTIMIC 1/2X4 (GAUZE/BANDAGES/DRESSINGS) ×2 IMPLANT
COVER SURGICAL LIGHT HANDLE (MISCELLANEOUS) ×3 IMPLANT
DRAPE C-ARM 42X72 X-RAY (DRAPES) IMPLANT
DRAPE C-ARMOR (DRAPES) ×6 IMPLANT
DRAPE INCISE IOBAN 66X45 STRL (DRAPES) ×3 IMPLANT
DRAPE POUCH INSTRU U-SHP 10X18 (DRAPES) ×3 IMPLANT
DRAPE SURG 17X23 STRL (DRAPES) ×12 IMPLANT
DRILL CANNULATED 7.5MM (BIT) ×3 IMPLANT
DURAPREP 26ML APPLICATOR (WOUND CARE) ×3 IMPLANT
ELECT CAUTERY BLADE 6.4 (BLADE) ×3 IMPLANT
ELECT REM PT RETURN 9FT ADLT (ELECTROSURGICAL) ×3
ELECTRODE REM PT RTRN 9FT ADLT (ELECTROSURGICAL) ×1 IMPLANT
GAUZE SPONGE 4X4 16PLY XRAY LF (GAUZE/BANDAGES/DRESSINGS) ×3 IMPLANT
GLOVE BIO SURGEON STRL SZ7 (GLOVE) ×3 IMPLANT
GLOVE BIO SURGEON STRL SZ8 (GLOVE) ×3 IMPLANT
GLOVE BIOGEL PI IND STRL 7.0 (GLOVE) ×1 IMPLANT
GLOVE BIOGEL PI IND STRL 8 (GLOVE) ×1 IMPLANT
GLOVE BIOGEL PI INDICATOR 7.0 (GLOVE) ×2
GLOVE BIOGEL PI INDICATOR 8 (GLOVE) ×2
GOWN STRL REUS W/ TWL LRG LVL3 (GOWN DISPOSABLE) ×2 IMPLANT
GOWN STRL REUS W/ TWL XL LVL3 (GOWN DISPOSABLE) ×1 IMPLANT
GOWN STRL REUS W/TWL LRG LVL3 (GOWN DISPOSABLE) ×4
GOWN STRL REUS W/TWL XL LVL3 (GOWN DISPOSABLE) ×2
K-WIRE 2.4X250 BLUNT TIP (WIRE)
K-WIRE 2.4X300 SHARP (WIRE) ×9
KIT BASIN OR (CUSTOM PROCEDURE TRAY) ×3 IMPLANT
KIT ROOM TURNOVER OR (KITS) ×3 IMPLANT
KWIRE 2.4X250 BLUNT TIP (WIRE) IMPLANT
KWIRE 2.4X300 SHARP (WIRE) ×3 IMPLANT
MANIFOLD NEPTUNE II (INSTRUMENTS) ×3 IMPLANT
NEEDLE 22X1 1/2 (OR ONLY) (NEEDLE) ×3 IMPLANT
NEEDLE HYPO 25GX1X1/2 BEV (NEEDLE) ×3 IMPLANT
NS IRRIG 1000ML POUR BTL (IV SOLUTION) ×3 IMPLANT
PACK UNIVERSAL I (CUSTOM PROCEDURE TRAY) ×3 IMPLANT
PAD ARMBOARD 7.5X6 YLW CONV (MISCELLANEOUS) ×6 IMPLANT
PENCIL BUTTON HOLSTER BLD 10FT (ELECTRODE) ×3 IMPLANT
POSITIONER BODY (SOFTGOODS) ×3 IMPLANT
PUTTY DBX 2.5CC (Putty) ×3 IMPLANT
PUTTY DBX 2.5CC DEPUY (Putty) ×1 IMPLANT
SCREW SI-LOK 10MM HA SL 40MM (Screw) ×3 IMPLANT
SCREW SI-LOK 10MM HA SL 55MM (Screw) ×6 IMPLANT
SPONGE GAUZE 4X4 12PLY (GAUZE/BANDAGES/DRESSINGS) ×3 IMPLANT
SPONGE LAP 18X18 X RAY DECT (DISPOSABLE) ×3 IMPLANT
STAPLER VISISTAT 35W (STAPLE) ×3 IMPLANT
STRIP CLOSURE SKIN 1/2X4 (GAUZE/BANDAGES/DRESSINGS) ×2 IMPLANT
SUT MNCRL AB 4-0 PS2 18 (SUTURE) ×3 IMPLANT
SUT VIC AB 0 CT1 18XCR BRD 8 (SUTURE) ×1 IMPLANT
SUT VIC AB 0 CT1 8-18 (SUTURE) ×2
SUT VIC AB 1 CT1 18XCR BRD 8 (SUTURE) ×1 IMPLANT
SUT VIC AB 1 CT1 8-18 (SUTURE) ×2
SUT VIC AB 2-0 CT2 18 VCP726D (SUTURE) ×3 IMPLANT
SYR BULB IRRIGATION 50ML (SYRINGE) ×3 IMPLANT
SYR CONTROL 10ML LL (SYRINGE) ×3 IMPLANT
TAPE CLOTH SURG 4X10 WHT LF (GAUZE/BANDAGES/DRESSINGS) ×3 IMPLANT
TOWEL OR 17X24 6PK STRL BLUE (TOWEL DISPOSABLE) ×3 IMPLANT
TOWEL OR 17X26 10 PK STRL BLUE (TOWEL DISPOSABLE) ×6 IMPLANT
TUBE CONNECTING 12'X1/4 (SUCTIONS) ×1
TUBE CONNECTING 12X1/4 (SUCTIONS) ×2 IMPLANT
WATER STERILE IRR 1000ML POUR (IV SOLUTION) ×3 IMPLANT
YANKAUER SUCT BULB TIP NO VENT (SUCTIONS) ×3 IMPLANT

## 2014-01-22 NOTE — Progress Notes (Signed)
Patient requesting something to help relax or for pain. Notified Dr. Ermalene Postin request we wait until they see patient. Patient notified.

## 2014-01-22 NOTE — Transfer of Care (Signed)
Immediate Anesthesia Transfer of Care Note  Patient: [name]  Procedure(s) Performed: Procedure(s) with comments: SACROILIAC JOINT FUSION (Left) - Left sided sacroiliac joint fusion  Patient Location: PACU  Anesthesia Type:General  Level of Consciousness: awake, alert  and oriented  Airway & Oxygen Therapy: Patient Spontanous Breathing and Patient connected to face mask oxygen  Post-op Assessment: Report given to PACU RN and Post -op Vital signs reviewed and stable  Post vital signs: Reviewed and stable  Complications: No apparent anesthesia complications

## 2014-01-22 NOTE — H&P (Signed)
PREOPERATIVE H&P  Chief Complaint: Left low back pain  HPI: [name] is a 43 y.o. male who presents with ongoing pain in the left low back  Patient responded well to RFA on the left previously, but pain recurred  Patient has failed multiple forms of conservative care and continues to have pain (see office notes for additional details regarding the patient's full course of treatment)  Past Medical History  Diagnosis Date  . Hyperlipidemia   . Hypertension     Dr. Allayne Butcher, [facility] family physicians  . Arthritis     back area  . Pancreatitis     hx of  . Depression     on prozac  . Sleep apnea     sleep study approx 18 months ago, uses cpap  . Chronic back pain   . Pneumonia     hx of  2005ish  . Anxiety   . Thrombocytopenia, idiopathic     age of 21 months  . Complication of anesthesia     noticed some memory loss after surgery 07/18/12  . GERD (gastroesophageal reflux disease)     uses baking soda  . Blood dyscrasia     thrombocytopenia  15 months  . Dysrhythmia     PAF in 2012, felt related to OSA--now on CPAP; PRN follow-up Dr. Lin Givens 03/2011   Past Surgical History  Procedure Laterality Date  . Cholecystectomy    . Hernia repair Left     inguinal  . Back surgery      x2 fusions, L5-S1, L4-L5  . Tonsillectomy    . Appendectomy      61 months of age  . Vasectomy    . Refractive surgery Bilateral 98  . Spinal cord stimulator implant  07/18/2012  . Spinal cord stimulator insertion  07/18/2012    Procedure: LUMBAR SPINAL CORD STIMULATOR INSERTION;  Surgeon: Melina Schools, MD;  Location: [place];  Service: Orthopedics;  Laterality: N/A;  trial spinal cord stimulator implant   . Spinal cord stimulator removal  07/26/2012    Procedure: LUMBAR SPINAL CORD STIMULATOR REMOVAL;  Surgeon: Melina Schools, MD;  Location: [place];  Service: Orthopedics;  Laterality: N/A;  REMOVAL OF TRIAL LEAD OR CONVERSION TO PERM SPINAL CORD STIMULATOR IMPLANT  .  Sacroiliac joint fusion Right 11/21/2013    Procedure: RIGHT SACROILIAC JOINT FUSION;  Surgeon: Sinclair Ship, MD;  Location: [place];  Service: Orthopedics;  Laterality: Right;  Right sided sacroiliac joint fusion  . Diagnostic laparoscopy      exploratory lab, abdominal   History   Social History  . Marital Status: Married    Spouse Name: N/A    Number of Children: N/A  . Years of Education: N/A   Social History Main Topics  . Smoking status: Former Smoker -- 1.00 packs/day for 18 years    Types: Cigarettes    Quit date: 08/08/2009  . Smokeless tobacco: Former Systems developer  . Alcohol Use: No  . Drug Use: No  . Sexual Activity: Yes   Other Topics Concern  . Not on file   Social History Narrative  . No narrative on file   No family history on file. Allergies  Allergen Reactions  . Nexium [Esomeprazole Magnesium] Hives and Swelling  . Wellbutrin [Bupropion] Hives and Swelling   Prior to Admission medications   Medication Sig Start Date End Date Taking? Authorizing Provider  aspirin 325 MG tablet Take 325 mg by mouth as needed.  Historical Provider, MD  diazepam (VALIUM) 5 MG tablet Take 5 mg by mouth 2 (two) times daily as needed for muscle spasms.     Historical Provider, MD  FLUoxetine (PROZAC) 20 MG capsule Take 20 mg by mouth at bedtime. Give with the 40 mg to equal the 60 mg dose    Historical Provider, MD  FLUoxetine (PROZAC) 40 MG capsule Take 40 mg by mouth at bedtime. Give with the 20 mg to equal 60 mg    Historical Provider, MD  lisinopril (PRINIVIL,ZESTRIL) 20 MG tablet Take 20 mg by mouth at bedtime.    Historical Provider, MD  methocarbamol (ROBAXIN) 750 MG tablet Take 750 mg by mouth 3 (three) times daily as needed for muscle spasms.     Historical Provider, MD  omega-3 acid ethyl esters (LOVAZA) 1 G capsule Take 3 g by mouth every morning.    Historical Provider, MD  ondansetron (ZOFRAN ODT) 4 MG disintegrating tablet 4mg  ODT q4 hours prn nausea/vomit 01/09/14    Osvaldo Shipper, MD  Oxycodone HCl 20 MG TABS Take 20 mg by mouth every 6 (six) hours as needed (pain).     Historical Provider, MD  Verapamil HCl CR 300 MG CP24 Take 300 mg by mouth at bedtime.    Historical Provider, MD     All other systems have been reviewed and were otherwise negative with the exception of those mentioned in the HPI and as above.  Physical Exam: There were no vitals filed for this visit.  General: Alert, no acute distress Cardiovascular: No pedal edema Respiratory: No cyanosis, no use of accessory musculature Skin: No lesions in the area of chief complaint Neurologic: Sensation intact distally Psychiatric: Patient is competent for consent with normal mood and affect Lymphatic: No axillary or cervical lymphadenopathy  MUSCULOSKELETAL: + TTP left low back  Assessment/Plan: Left sided sacroiliac joint dysfunction Plan for Procedure(s): SACROILIAC JOINT FUSION   Sinclair Ship, MD 01/22/2014 8:04 AM

## 2014-01-22 NOTE — Anesthesia Preprocedure Evaluation (Addendum)
Anesthesia Evaluation  Patient identified by MRN, date of birth, ID band Patient awake    Reviewed: Allergy & Precautions, H&P , NPO status , Patient's Chart, lab work & pertinent test results  History of Anesthesia Complications Negative for: history of anesthetic complications  Airway Mallampati: II TM Distance: >3 FB Neck ROM: Full    Dental  (+) Teeth Intact   Pulmonary sleep apnea and Continuous Positive Airway Pressure Ventilation , neg COPDneg recent URI, former smoker,  breath sounds clear to auscultation        Cardiovascular hypertension, Pt. on medications Rhythm:Regular     Neuro/Psych PSYCHIATRIC DISORDERS Anxiety Depression Chronic back pain, narcotic dependent  Neuromuscular disease    GI/Hepatic Neg liver ROS, GERD-  Controlled,  Endo/Other  neg diabetesMorbid obesity  Renal/GU negative Renal ROS     Musculoskeletal negative musculoskeletal ROS (+)   Abdominal   Peds  Hematology  (+) Blood dyscrasia, , H/o thrombocytopenia   Anesthesia Other Findings   Reproductive/Obstetrics                          Anesthesia Physical Anesthesia Plan  ASA: II  Anesthesia Plan: General   Post-op Pain Management:    Induction: Intravenous  Airway Management Planned: Oral ETT  Additional Equipment: None  Intra-op Plan:   Post-operative Plan: Extubation in OR  Informed Consent: I have reviewed the patients History and Physical, chart, labs and discussed the procedure including the risks, benefits and alternatives for the proposed anesthesia with the patient or authorized representative who has indicated his/her understanding and acceptance.   Dental advisory given  Plan Discussed with: CRNA and Surgeon  Anesthesia Plan Comments:         Anesthesia Quick Evaluation

## 2014-01-22 NOTE — Progress Notes (Signed)
Case Management notified about need for DME rolling walker.

## 2014-01-22 NOTE — Op Note (Signed)
[name], [name]             ACCOUNT NO.:  [id number]  MEDICAL RECORD NO.:  [id number]  LOCATION:  [facility]                         FACILITY:  [facility]  PHYSICIAN:  Phylliss Bob, MD      DATE OF BIRTH:  19-Jan-1971  DATE OF PROCEDURE:  01/22/2014 DATE OF DISCHARGE:  01/22/2014                              OPERATIVE REPORT   PREOPERATIVE DIAGNOSIS:  Left sacroiliac joint dysfunction.  POSTOPERATIVE DIAGNOSIS:  Left sacroiliac joint dysfunction.  PROCEDURES:  Left sacroiliac joint fusion using SI-LOC minimally invasive fusion system.  SURGEON:  Phylliss Bob, MD.  ASSISTANTPricilla Holm, PA-C  ANESTHESIA:  General endotracheal anesthesia.  COMPLICATIONS:  None.  DISPOSITION:  Stable.  ESTIMATED BLOOD LOSS:  Minimal.  INDICATIONS FOR PROCEDURE:  Briefly, [name] is a very pleasant 43- year-old male, who did present to me with severe pain in both the right and the left sides of his low back.  His pain very consistent with sacroiliac joint dysfunction.  The patient did ultimately have a right sacroiliac joint fusion and did extremely well from that procedure.  He did elect to proceed with the same procedure on the left side.  OPERATIVE DETAILS:  On January 22, 2014, the patient was brought to surgery and general endotracheal anesthesia was administered.  The patient was placed prone on a well-padded flat Jackson bed.  Gels were placed under the patient's chest and hips.  Antibiotics were given and a time-out procedure was performed.  The region of the left buttock was prepped and draped in the usual sterile fashion.  I then made a 3-cm incision in line with the posterior border of the sacrum.  The guidewires were advanced across the sacroiliac joint.  One at the level of the S1 foramen; one between the S1 and S2 foramina and one in line with the S2 foramen.  I then drilled over the guidewires and 10-mm implants of the appropriate length were advanced over the guidewires.   The slot and the screws were packed with DBX putty prior to advancing the screws across the sacroiliac joint.  I did liberally use lateral inlet and outlet radiographs while advancing the screws.  I was very pleased with the final appearance.  The guidewires were removed and the wound was copiously irrigated.  The wound was then closed with 0 Vicryl, followed by 2-0 Vicryl, followed by 3-0 Monocryl.  Benzoin and Steri-Strips were applied followed by sterile dressing.  All instrument counts were correct at the termination of the procedure.  Of note, Pricilla Holm, PA-C was my assistant throughout surgery and did aid in retraction, suctioning, and closure.     Phylliss Bob, MD     MD/MEDQ  D:  01/22/2014  T:  01/22/2014  Job:  [id number]  cc:   Janace Litten, MD

## 2014-01-22 NOTE — Anesthesia Postprocedure Evaluation (Signed)
  Anesthesia Post-op Note  Patient: [name]  Procedure(s) Performed: Procedure(s) with comments: SACROILIAC JOINT FUSION (Left) - Left sided sacroiliac joint fusion  Patient Location: PACU  Anesthesia Type:General  Level of Consciousness: awake  Airway and Oxygen Therapy: Patient Spontanous Breathing  Post-op Pain: mild  Post-op Assessment: Post-op Vital signs reviewed  Post-op Vital Signs: Reviewed  Last Vitals:  Filed Vitals:   01/22/14 1951  BP:   Pulse: 76  Temp: 36.7 C  Resp: 19    Complications: No apparent anesthesia complications

## 2014-01-22 NOTE — Progress Notes (Signed)
ED CM received incoming call from [facility] in PACU regarding patient needing a rolling walker. Pt s/p Left sacroiliac joint fusion. Payor source is [facility]. Spoke with patient's wife [name], who is requesting Referral to be sent [place]. Wife stated, that she will pick up from the [facility] in the AM. Referral was faxed to [facility] D[facility] Dept.  [phone]. Contact  information given if any questions or concerns should arise. No further CM needs identified.

## 2014-01-23 NOTE — Progress Notes (Signed)
ED CM contacted patient by phone to follow up on rolling walker. Pt stated , it was picked up today by his wife. No further CM need identifed.

## 2014-01-24 NOTE — Addendum Note (Signed)
Addendum created 01/24/14 2045 by Laurie Panda, MD   Modules edited: Anesthesia Events

## 2014-01-27 ENCOUNTER — Encounter (HOSPITAL_COMMUNITY): Payer: Self-pay | Admitting: Orthopedic Surgery

## 2015-07-15 ENCOUNTER — Encounter: Payer: Self-pay | Admitting: Sports Medicine

## 2015-07-15 ENCOUNTER — Ambulatory Visit (INDEPENDENT_AMBULATORY_CARE_PROVIDER_SITE_OTHER): Payer: 59 | Admitting: Sports Medicine

## 2015-07-15 ENCOUNTER — Ambulatory Visit (INDEPENDENT_AMBULATORY_CARE_PROVIDER_SITE_OTHER): Payer: 59

## 2015-07-15 DIAGNOSIS — M79671 Pain in right foot: Secondary | ICD-10-CM

## 2015-07-15 DIAGNOSIS — M722 Plantar fascial fibromatosis: Secondary | ICD-10-CM | POA: Diagnosis not present

## 2015-07-15 MED ORDER — MELOXICAM 15 MG PO TABS: 15.0000 mg | ORAL_TABLET | Freq: Every day | ORAL | Status: DC

## 2015-07-15 MED ORDER — TRIAMCINOLONE ACETONIDE 10 MG/ML IJ SUSP: 10.0000 mg | Freq: Once | INTRAMUSCULAR | Status: DC

## 2015-07-15 NOTE — Progress Notes (Signed)
Patient ID: [name], male   DOB: 1970/09/18, 44 y.o.   MRN: [id number] Subjective: [name] is a 44 y.o. male patient presents to office with complaint of heel pain on the right. Patient admits to post static dyskinesia for 1 month in duration. Patient has tried night splint and ice with some relief. Admits to having plantar fasciitis >10 years ago which was treated by Dr. Amalia Hailey and even had a laser surgery to the area. Reports that he has old orthotics and would like to consider having new ones made with a heel lift on the right side; he thinks the right side may be a little shorter because he can only tolerate wearing the orthotics if he takes the left one out of his shoe. Patient also has an extensive history of low back pain and is status post fusion surgery; reports that occasionally he has pain the runs down the back of the legs. Patient denies any other pedal complaints or concerns at this time.  Review of Systems  Constitutional: Positive for fatigue.  HENT:       Hearing loss or ringing  Sinus problems    Cardiovascular:       High low BP   Musculoskeletal:       Muscle pain or cramps  osteoarthritis Rheumatoid arthritis   Neurological: Positive for dizziness.       Memory loss  All other systems reviewed and are negative.  Patient Active Problem List   Diagnosis Date Noted  . Abdominal pain, left lower quadrant 06/21/2013   Current Outpatient Prescriptions on File Prior to Visit  Medication Sig Dispense Refill  . diazepam (VALIUM) 5 MG tablet Take 5 mg by mouth 2 (two) times daily as needed for muscle spasms.     Marland Kitchen FLUoxetine (PROZAC) 20 MG capsule Take 20 mg by mouth at bedtime. Give with the 40 mg to equal the 60 mg dose    . FLUoxetine (PROZAC) 40 MG capsule Take 40 mg by mouth at bedtime. Give with the 20 mg to equal 60 mg    . lisinopril (PRINIVIL,ZESTRIL) 20 MG tablet Take 20 mg by mouth at bedtime.    . ondansetron (ZOFRAN ODT) 4 MG disintegrating  tablet 4mg  ODT q4 hours prn nausea/vomit 4 tablet 0  . Oxycodone HCl 20 MG TABS Take 20 mg by mouth every 6 (six) hours as needed (pain).     . Verapamil HCl CR 300 MG CP24 Take 300 mg by mouth at bedtime.     No current facility-administered medications on file prior to visit.   Allergies  Allergen Reactions  . Nexium [Esomeprazole Magnesium] Hives and Swelling  . Wellbutrin [Bupropion] Hives and Swelling    Objective: Physical Exam General: The patient is alert and oriented x3 in no acute distress.  Dermatology: Skin is warm, dry and supple bilateral lower extremities. Nails 1-10 are normal. There is no erythema, edema, no eccymosis, no open lesions present. Integument is otherwise unremarkable.  Vascular: Dorsalis Pedis pulse and Posterior Tibial pulse are 2/4 bilateral. Capillary fill time is immediate to all digits.  Neurological: Grossly intact to light touch with an achilles reflex of +2/5 and a  negative Tinel's sign bilateral.  Musculoskeletal: Tenderness to palpation at the medial calcaneal tubercale and through the insertion of the plantar fascia on the right foot. No pain with compression of calcaneus bilateral. No pain with tuning fork to calcaneus bilateral. No pain with calf compression bilateral. Pedal and ankle joints range  of motion within normal limits bilateral. Strength 5/5 in all groups bilateral.   Gait: Cane assisted, Antalgic avoid weight on right heel  Xray, Right foot: 3 Views Normal osseous mineralization. Joint spaces preserved. No fracture/dislocation/boney destruction. Pes planus foot type with mild 4-5 hammertoes, moderate breach in midtarsal joint and mild midfoot and ankle osteophytes, Severe inferior and posterior Calcaneal spur present with mild thickening of plantar fascia. No other soft tissue abnormalities or radiopaque foreign bodies.   Assessment and Plan: Problem List Items Addressed This Visit    None    Visit Diagnoses    Right foot pain     -  Primary    Relevant Orders    DG Foot Complete Right    Plantar fasciitis of right foot          -Complete examination performed. Discussed with patient in detail the condition of plantar fasciitis, how this occurs and general treatment options. Explained both conservative and surgical treatments.  -After oral consent and aseptic prep, injected a mixture containing 1 ml of 2%  plain lidocaine, 1 ml 0.5% plain marcaine, 0.5 ml of kenalog 10 and 0.5 ml of dexamethasone phosphate into right heel. Post-injection care discussed with patient.  -Rx Meloxicam 15 mg po daily.  -Recommended good supportive shoes daily -Will have office staff to check insurance to give patient cost breakdown for custom orthotics at next visit.  - Explained in detail the use of the fascial brace which was dispensed at today's visit. -Recommend cont with night splint of which he already owns daily.  -Explained and dispensed to patient daily stretching exercises including hip exercises since patient has some occasional weakness and abduction of right hip. -Recommend patient to ice affected area 1-2x daily. -Patient to return to office in 3 weeks for follow up or sooner if problems or questions arise.  Landis Martins, DPM

## 2015-07-15 NOTE — Progress Notes (Deleted)
   Subjective:    Patient ID: [name], male    DOB: 1971/02/05, 44 y.o.   MRN: [id number]  HPI    Review of Systems  Constitutional: Positive for fatigue.  HENT:       Hearing loss or ringing  Sinus problems    Cardiovascular:       High low BP   Musculoskeletal:       Muscle pain or cramps  osteoarthritis Rheumatoid arthritis   Neurological: Positive for dizziness.       Memory loss  All other systems reviewed and are negative.      Objective:   Physical Exam        Assessment & Plan:

## 2015-07-15 NOTE — Patient Instructions (Signed)

## 2015-07-29 ENCOUNTER — Ambulatory Visit: Payer: 59 | Admitting: Sports Medicine

## 2015-07-29 ENCOUNTER — Encounter: Payer: Self-pay | Admitting: Sports Medicine

## 2015-07-29 ENCOUNTER — Ambulatory Visit (INDEPENDENT_AMBULATORY_CARE_PROVIDER_SITE_OTHER): Payer: 59 | Admitting: Sports Medicine

## 2015-07-29 DIAGNOSIS — M79671 Pain in right foot: Secondary | ICD-10-CM | POA: Diagnosis not present

## 2015-07-29 DIAGNOSIS — M722 Plantar fascial fibromatosis: Secondary | ICD-10-CM

## 2015-07-29 DIAGNOSIS — M2170 Unequal limb length (acquired), unspecified site: Secondary | ICD-10-CM

## 2015-07-29 MED ORDER — TRIAMCINOLONE ACETONIDE 10 MG/ML IJ SUSP: 10.0000 mg | Freq: Once | INTRAMUSCULAR | Status: DC

## 2015-07-29 MED ORDER — METHYLPREDNISOLONE 4 MG PO TBPK
ORAL_TABLET | ORAL | Status: DC
Start: 2015-07-29 — End: 2016-01-27

## 2015-07-29 MED ORDER — DICLOFENAC SODIUM 75 MG PO TBEC: 75.0000 mg | DELAYED_RELEASE_TABLET | Freq: Two times a day (BID) | ORAL | Status: DC

## 2015-07-29 NOTE — Progress Notes (Signed)
Patient ID: [name], male   DOB: June 04, 1971, 44 y.o.   MRN: [id number] Subjective: [name] is a 44 y.o. male returns to office for follow up evaluation after Right heel injection for plantar fasciitis, injection #1 administered 2 weeks ago. Patient states that the injection did not help his pain at all and states that the mobic did not do much for him either. Reports that his heel hurts after sitting and after being on foot for a while. Patient reports that he is moving and has been doing a lot more. Reports that icing helps his pain a lot. Patient denies any recent changes in medications or new problems since last visit.   Patient Active Problem List   Diagnosis Date Noted  . Abdominal pain, left lower quadrant 06/21/2013   Current Outpatient Prescriptions on File Prior to Visit  Medication Sig Dispense Refill  . diazepam (VALIUM) 5 MG tablet Take 5 mg by mouth 2 (two) times daily as needed for muscle spasms.     Marland Kitchen FLUoxetine (PROZAC) 20 MG capsule Take 20 mg by mouth at bedtime. Give with the 40 mg to equal the 60 mg dose    . FLUoxetine (PROZAC) 40 MG capsule Take 40 mg by mouth at bedtime. Give with the 20 mg to equal 60 mg    . lisinopril (PRINIVIL,ZESTRIL) 20 MG tablet Take 20 mg by mouth at bedtime.    . meloxicam (MOBIC) 15 MG tablet Take 1 tablet (15 mg total) by mouth daily. 30 tablet 0  . ondansetron (ZOFRAN ODT) 4 MG disintegrating tablet 4mg  ODT q4 hours prn nausea/vomit 4 tablet 0  . Oxycodone HCl 20 MG TABS Take 20 mg by mouth every 6 (six) hours as needed (pain).     . Verapamil HCl CR 300 MG CP24 Take 300 mg by mouth at bedtime.     Current Facility-Administered Medications on File Prior to Visit  Medication Dose Route Frequency Provider Last Rate Last Dose  . triamcinolone acetonide (KENALOG) 10 MG/ML injection 10 mg  10 mg Other Once Landis Martins, DPM       Allergies  Allergen Reactions  . Nexium [Esomeprazole Magnesium] Hives and Swelling  .  Wellbutrin [Bupropion] Hives and Swelling   Objective:   General:  Alert and oriented x 3, in no acute distress, obese ambulates with cane secondary to back which is fused  Dermatology: Skin is warm, dry, and supple bilateral. Nails are within normal limits. There is no lower extremity erythema, no eccymosis, no open lesions present bilateral.   Vascular: Dorsalis Pedis and Posterior Tibial pedal pulses are 2/4 bilateral. + hair growth noted bilateral. Capillary Fill Time is 3 seconds in all digits. No varicosities, No edema bilateral lower extremities.   Neurological: Sensation grossly intact to light touch with an achilles reflex of +2 and a  negative Tinel's sign bilateral. Vibratory, sharp/dull, Semmes Weinstein Monofilament within normal limits.   Musculoskeletal: There is tenderness to palpation at the medial calcaneal tubercale and through the insertion of the plantar fascia on the right foot. No pain with compression to calcaneus or application of tuning fork. Ankle and pedal joint range of motion  within normal limits bilateral. Strength 5/5 bilateral. Possible 1/4 inch right short limb secondary to spinal fusion.   Assessment and Plan: Problem List Items Addressed This Visit    None    Visit Diagnoses    Plantar fasciitis of right foot    -  Primary    Relevant  Medications    methylPREDNISolone (MEDROL DOSEPAK) 4 MG TBPK tablet    diclofenac (VOLTAREN) 75 MG EC tablet    triamcinolone acetonide (KENALOG) 10 MG/ML injection 10 mg    Foot pain, right        Relevant Medications    methylPREDNISolone (MEDROL DOSEPAK) 4 MG TBPK tablet    diclofenac (VOLTAREN) 75 MG EC tablet    triamcinolone acetonide (KENALOG) 10 MG/ML injection 10 mg    Acquired unequal limb length        Right possible       -Complete examination performed.  -Discussed with patient in detail the condition of plantar fasciitis, how this  occurs related to the foot type of the patient and general treatment  options. - Patient opted for another injection #2 today; After oral consent and aseptic prep, injected a mixture containing 0.5 ml of 1%plain lidocaine, 0.5 ml 0.25% plain marcaine, 0.5 ml of kenalog 10 and 0.5 ml of dexmethasone phosphate to right heel at area of most pain/trigger point injection. -Cont with fascial brace, night splint, daily stretching, icing, good supportive shoes with inserts. If pain is better at next visit will consider custom orthotics with lift for right -Rx Medrol dose pack and diclofenac to start once dose pack is complete -Discussed long term care and reocurrence; will closely monitor; if fails to improve will consider other treatment modalities.  Gave patient info on shockwave and EPF to consider. May even get CT or Ultrasound of right foot to evaluate plantar fascia if fails to improve; patient presents as a more challenging case because of his back and hip problems as well as habitus.  -Encouraged right hip mobility and strengthening exercises daily -Patient to return to office in 3 weeks for follow up or sooner if problems or questions arise.  Landis Martins, DPM

## 2015-07-29 NOTE — Patient Instructions (Signed)
Endoscopic Plantar Fasciotomy On the underside of the foot and heel is a tight band of tissue called the plantar fascia. Sometimes the plantar fascia become inflamed (the body's way of reacting to injury, overuse or infection) which produces pain. The condition is known as plantar fasciitis.  One way to treat plantar fasciitis is through an endoscopic plantar fasciotomy. This is surgery to reduce the tension on the plantar fascia. However, it is a minimally invasive surgery because there will be no large incision. Instead, the surgeon inserts a thin, flexible tube through a small (1/16th of an inch (1.59 mm)) cut in your skin. The surgeon can examine and release the fascia through this tube. Recovery from an endoscopic fasciotomy is usually less painful and faster than from open surgery. LET YOUR CAREGIVER KNOW ABOUT:  Any allergies.  All medications you are taking, including:  Herbs, eyedrops, over-the-counter medications and creams.  Blood thinners (anticoagulants) or other drugs that could affect blood clotting.  Use of steroids (by mouth or as creams).  Previous problems with anesthetics, including local anesthetics.  Possibility of pregnancy, if this applies.  Any history of blood clots.  Any history of bleeding or other blood problems.  Previous surgery.  Smoking history.  Other health problems.  Family history of anesthetic problems RISKS AND COMPLICATIONS  Short-term possibilities include:  Excessive bleeding.  Pain.  Loss of feeling (numbness) at the site of the incision.  Hematoma, a pooling of blood in the wound.  Infection.  Slow resolution of the symptoms. Longer-term possibilities include:  Scarring.  A return of the condition that led to fasciotomy.  Damage to nerves in the area.  Weakness in your foot.  Need for additional surgery. BEFORE THE PROCEDURE  Ask whether you need to get shoes that will support your heel and arch while you  recover.  7 to 10 days before the surgery, stop using aspirin and non-steroidal anti-inflammatory drugs (NSAIDs) for pain relief. This includes prescription drugs and over-the-counter drugs such as ibuprofen and naproxen.  If you take blood-thinners, ask your healthcare provider when you should stop taking them.  Do not eat or drink for about 8 hours before your surgery.  You might be asked to shower or wash with a special antibacterial soap before the procedure.  Arrive 1-2 hours before the surgery, or whenever your surgeon recommends. This will give you time to check in and fill out any needed paperwork.  If your surgery is an outpatient procedure, you will be able to go home the same day. Make arrangements in advance for someone to drive you home. PROCEDURE  You may be given general anesthesia (you will be asleep), regional anesthesia (your leg will be numbed) or local anesthesia (just the area around the fascia will be numbed). With regional and local anesthesia you will be given medication to make you groggy but awake during the procedure.  Your foot will be cleaned and sterilized.  The surgeon will make a cut (incision) on the side of your heel. Then a thin tube that contains a tiny camera will be inserted into the space. The camera makes it possible for the surgeon to see what is happening inside your foot.  The surgeon will work through this tube to release the fascia.  The tube will be removed, and dressing will be applied to the incisions. AFTER THE PROCEDURE After the procedure, you will be taken to another room to recover. People usually go home the same day. Before leaving, make sure   you have detailed instructions on how to care for the incision. Also, you may be given crutches and shown how to use them. Ask your surgeon whether physical therapy will be needed.  HOME CARE INSTRUCTIONS   Take any prescription medication for pain and/or nausea that your surgeon prescribes.  Follow the directions carefully and take all of the medication.  Ask your surgeon whether you can take over-the-counter medicines for pain, discomfort or fever. Do not take aspirin unless your healthcare provider says to. Aspirin can increase the chances of bleeding.  You may need to put ice on your foot for 10 to 15 minutes each day for several days.  While you are resting, keep your foot elevated above the level of your heart.  Do not get the incisions wet for the first few days after surgery (or until the surgeon tells you it is OK).  Avoid standing or walking for long periods. Your healthcare provider will tell you when you are clear to resume normal activity. If your job requires a lot of standing or walking, ask to be assigned to a less active position for about 8 weeks.  When you are up and about, wear shoes with a supportive heel and arch support. Soft running shoes may be recommended for the first two weeks of recovery. SEEK MEDICAL CARE IF:   The wound becomes red or swollen.  The wound leaks fluid or blood.  Your pain increases.  You become nauseous or vomit for more than two days after the surgery.  You have pain or difficulty moving your foot.  You develop a fever of more than 100.5 F (38.1 C). SEEK IMMEDIATE MEDICAL CARE IF:   Your leg or foot starts to swell.  You develop a fever of 102.0 F (38.9 C) or higher.   This information is not intended to replace advice given to you by your health care provider. Make sure you discuss any questions you have with your health care provider.   Document Released: 05/22/2009 Document Revised: 10/17/2011 Document Reviewed: 04/07/2015 Elsevier Interactive Patient Education 2016 [employer]  

## 2015-08-05 ENCOUNTER — Ambulatory Visit: Payer: 59 | Admitting: Sports Medicine

## 2015-08-11 DIAGNOSIS — E7800 Pure hypercholesterolemia, unspecified: Secondary | ICD-10-CM | POA: Diagnosis not present

## 2015-08-11 DIAGNOSIS — Z79899 Other long term (current) drug therapy: Secondary | ICD-10-CM | POA: Diagnosis not present

## 2015-08-14 MED FILL — FLUoxetine HCL 40 MG CAPS: 40 | 90 days supply | Qty: 90 | Fill #0

## 2015-08-19 ENCOUNTER — Encounter: Payer: Self-pay | Admitting: Sports Medicine

## 2015-08-19 ENCOUNTER — Ambulatory Visit (INDEPENDENT_AMBULATORY_CARE_PROVIDER_SITE_OTHER): Payer: 59 | Admitting: Sports Medicine

## 2015-08-19 ENCOUNTER — Telehealth: Payer: Self-pay | Admitting: *Deleted

## 2015-08-19 DIAGNOSIS — M2141 Flat foot [pes planus] (acquired), right foot: Secondary | ICD-10-CM

## 2015-08-19 DIAGNOSIS — M722 Plantar fascial fibromatosis: Secondary | ICD-10-CM

## 2015-08-19 DIAGNOSIS — Q6650 Congenital pes planus, unspecified foot: Secondary | ICD-10-CM | POA: Diagnosis not present

## 2015-08-19 DIAGNOSIS — M2142 Flat foot [pes planus] (acquired), left foot: Secondary | ICD-10-CM | POA: Diagnosis not present

## 2015-08-19 DIAGNOSIS — M2170 Unequal limb length (acquired), unspecified site: Secondary | ICD-10-CM | POA: Diagnosis not present

## 2015-08-19 DIAGNOSIS — M79671 Pain in right foot: Secondary | ICD-10-CM

## 2015-08-19 MED ORDER — TRIAMCINOLONE ACETONIDE 10 MG/ML IJ SUSP: 10.0000 mg | Freq: Once | INTRAMUSCULAR | Status: DC

## 2015-08-19 NOTE — Telephone Encounter (Addendum)
-----   Message from Landis Martins, [place] sent at 08/19/2015 11:40 AM EST ----- Regarding: Ultrasound right foot  Hi [name]  Can you please order and arrange a musculoskeletal ultrasound at [facility] of the right foot to eval plantar fascia for fasciitis. Patient can not get MRI due to extensive hardware in back (spinal fusion). Thanks Dr. Cannon Kettle  [place] right foot ordered and faxed to [phone].  [facility] DOES NOT REQUIRE PRIOR AUTHORIZATION FOR [name], REFERENCE [id number].  08/27/2015 - [facility] does not have musculoskeletal [place] at this time, suggested pt go to [facility].  I contacted pt, informed I would be referring to [place] and they would call to set up appt.  Orders to [place].

## 2015-08-19 NOTE — Progress Notes (Signed)
Patient ID: [name], male   DOB: Jun 26, 1971, 45 y.o.   MRN: [id number]  Subjective: [name] is a 45 y.o. male returns to office for follow up evaluation after Right heel injection for plantar fasciitis, injection #2 administered 3 weeks ago. Patient states that the injection maybe helped for 2-3 days but then pain came back at same intensity. Patient states that he has completed the medications by mouth as well with little notice in improvement in symptoms. Patient denies any recent changes in medications or new problems since last visit.   Patient Active Problem List   Diagnosis Date Noted  . Abdominal pain, left lower quadrant 06/21/2013   Current Outpatient Prescriptions on File Prior to Visit  Medication Sig Dispense Refill  . diazepam (VALIUM) 5 MG tablet Take 5 mg by mouth 2 (two) times daily as needed for muscle spasms.     . diclofenac (VOLTAREN) 75 MG EC tablet Take 1 tablet (75 mg total) by mouth 2 (two) times daily. 30 tablet 0  . FLUoxetine (PROZAC) 20 MG capsule Take 20 mg by mouth at bedtime. Give with the 40 mg to equal the 60 mg dose    . FLUoxetine (PROZAC) 40 MG capsule Take 40 mg by mouth at bedtime. Give with the 20 mg to equal 60 mg    . lisinopril (PRINIVIL,ZESTRIL) 20 MG tablet Take 20 mg by mouth at bedtime.    . meloxicam (MOBIC) 15 MG tablet Take 1 tablet (15 mg total) by mouth daily. 30 tablet 0  . methylPREDNISolone (MEDROL DOSEPAK) 4 MG TBPK tablet Take as instructed 21 tablet 0  . ondansetron (ZOFRAN ODT) 4 MG disintegrating tablet 4mg  ODT q4 hours prn nausea/vomit 4 tablet 0  . Oxycodone HCl 20 MG TABS Take 20 mg by mouth every 6 (six) hours as needed (pain).     . Verapamil HCl CR 300 MG CP24 Take 300 mg by mouth at bedtime.     Current Facility-Administered Medications on File Prior to Visit  Medication Dose Route Frequency Provider Last Rate Last Dose  . triamcinolone acetonide (KENALOG) 10 MG/ML injection 10 mg  10 mg Other Once [employer], DPM      . triamcinolone acetonide (KENALOG) 10 MG/ML injection 10 mg  10 mg Other Once Landis Martins, DPM       Allergies  Allergen Reactions  . Nexium [Esomeprazole Magnesium] Hives and Swelling  . Wellbutrin [Bupropion] Hives and Swelling   Objective:   General:  Alert and oriented x 3, in no acute distress, obese ambulates with cane secondary to back which is fused  Dermatology: Skin is warm, dry, and supple bilateral. Nails are within normal limits. There is no lower extremity erythema, no eccymosis, no open lesions present bilateral.   Vascular: Dorsalis Pedis and Posterior Tibial pedal pulses are 2/4 bilateral. + hair growth noted bilateral. Capillary Fill Time is 3 seconds in all digits. No varicosities, No edema bilateral lower extremities.   Neurological: Sensation grossly intact to light touch with an achilles reflex of +2 and a  negative Tinel's sign bilateral. Vibratory, sharp/dull, Semmes Weinstein Monofilament within normal limits.   Musculoskeletal: There is tenderness to palpation at the medial calcaneal tubercale and through the insertion of the plantar fascia on the right foot. No pain with compression to calcaneus or application of tuning fork. Ankle and pedal joint range of motion  within normal limits bilateral. Strength 5/5 bilateral. Possible 1/4 inch right short limb secondary to spinal fusion.  Pes Planus foot type bilateral.   Assessment and Plan: Problem List Items Addressed This Visit    None    Visit Diagnoses    Plantar fasciitis of right foot    -  Primary    Minimal response to injections    Relevant Medications    triamcinolone acetonide (KENALOG) 10 MG/ML injection 10 mg (Start on 08/19/2015 11:45 AM)    Foot pain, right        Relevant Medications    triamcinolone acetonide (KENALOG) 10 MG/ML injection 10 mg (Start on 08/19/2015 11:45 AM)    Acquired unequal limb length        R    Pes planus of both feet           -Complete examination  performed.  -Discussed with patient in detail the condition of plantar fasciitis, how this  occurs related to the foot type of the patient and general treatment options. - Patient opted for another injection, #3 today; After oral consent and aseptic prep, injected a mixture containing 0.5 ml of 1%plain lidocaine, 0.5 ml 0.25% plain marcaine, 0.5 ml of kenalog 10 and 0.5 ml of dexmethasone phosphate to right heel at area of most pain/trigger point injection. Patient tolerated injection well. Post injection care explained.   -Cont with fascial brace (gave new replacement since current one is worn and no longer providing support), night splint, daily stretching, icing, good supportive shoes with OTC inserts until custom orthotics are received.  -Scanned patient to custom orthotics today with deep heel cup and 1/4 heel lift on right; Rx sent to [facility] lab -Discussed long term care and reocurrence; will closely monitor; if fails to improve will consider other treatment modalities.  Recommended patient to strongly consider shockwave and EPF. -Recommend and ordered Ultrasound of right foot to evaluate plantar fascia; patient presents as a more challenging case because of his back and hip problems as well as habitus.  -Encouraged right hip mobility and strengthening exercises daily.  -Patient to return to office in 3 weeks for follow up or sooner if problems or questions arise.  Landis Martins, DPM

## 2015-08-26 MED FILL — VERAPAMIL ER PM 300 MG CAP: 300 | 30 days supply | Qty: 30 | Fill #0

## 2015-08-26 MED FILL — LISINOPRIL 20 MG TABLET: 20 | 30 days supply | Qty: 30 | Fill #0

## 2015-08-26 MED FILL — DEPO-TESTOSTERONE 200 MG/ML: 200 | 84 days supply | Qty: 6 | Fill #0

## 2015-08-28 MED FILL — METHOCARBAMOL 750 MG TABLET: 750 | 30 days supply | Qty: 90 | Fill #1

## 2015-09-04 DIAGNOSIS — E291 Testicular hypofunction: Secondary | ICD-10-CM | POA: Diagnosis not present

## 2015-09-04 DIAGNOSIS — H9313 Tinnitus, bilateral: Secondary | ICD-10-CM | POA: Diagnosis not present

## 2015-09-04 DIAGNOSIS — Z79899 Other long term (current) drug therapy: Secondary | ICD-10-CM | POA: Diagnosis not present

## 2015-09-04 DIAGNOSIS — H6503 Acute serous otitis media, bilateral: Secondary | ICD-10-CM | POA: Diagnosis not present

## 2015-09-04 DIAGNOSIS — H6983 Other specified disorders of Eustachian tube, bilateral: Secondary | ICD-10-CM | POA: Diagnosis not present

## 2015-09-04 MED FILL — predniSONE 10 MG (21) TBPK: 10 | 6 days supply | Qty: 21 | Fill #0

## 2015-09-07 ENCOUNTER — Other Ambulatory Visit: Payer: 59

## 2015-09-07 DIAGNOSIS — G894 Chronic pain syndrome: Secondary | ICD-10-CM | POA: Diagnosis not present

## 2015-09-07 DIAGNOSIS — M961 Postlaminectomy syndrome, not elsewhere classified: Secondary | ICD-10-CM | POA: Diagnosis not present

## 2015-09-07 DIAGNOSIS — M461 Sacroiliitis, not elsewhere classified: Secondary | ICD-10-CM | POA: Diagnosis not present

## 2015-09-07 DIAGNOSIS — Z79891 Long term (current) use of opiate analgesic: Secondary | ICD-10-CM | POA: Diagnosis not present

## 2015-09-07 MED FILL — oxyCODONE HCL 15 MG TABS: 15 | 25 days supply | Qty: 150 | Fill #0

## 2015-09-08 DIAGNOSIS — G4733 Obstructive sleep apnea (adult) (pediatric): Secondary | ICD-10-CM | POA: Diagnosis not present

## 2015-09-09 ENCOUNTER — Encounter: Payer: Self-pay | Admitting: Sports Medicine

## 2015-09-09 ENCOUNTER — Ambulatory Visit (INDEPENDENT_AMBULATORY_CARE_PROVIDER_SITE_OTHER): Payer: 59 | Admitting: Sports Medicine

## 2015-09-09 ENCOUNTER — Ambulatory Visit: Payer: 59 | Admitting: Sports Medicine

## 2015-09-09 DIAGNOSIS — M2170 Unequal limb length (acquired), unspecified site: Secondary | ICD-10-CM

## 2015-09-09 DIAGNOSIS — M722 Plantar fascial fibromatosis: Secondary | ICD-10-CM | POA: Diagnosis not present

## 2015-09-09 DIAGNOSIS — M2142 Flat foot [pes planus] (acquired), left foot: Secondary | ICD-10-CM | POA: Diagnosis not present

## 2015-09-09 DIAGNOSIS — M2141 Flat foot [pes planus] (acquired), right foot: Secondary | ICD-10-CM | POA: Diagnosis not present

## 2015-09-09 DIAGNOSIS — M79671 Pain in right foot: Secondary | ICD-10-CM | POA: Diagnosis not present

## 2015-09-09 NOTE — Progress Notes (Addendum)
Patient ID: [name], male   DOB: 1971/05/02, 45 y.o.   MRN: [id number]  Subjective: [name] is a 45 y.o. male returns to office for pickup of new orthotics. Patient is still awaiting ultrasound right plantar fascia. Patient denies any recent changes in medications or new problems since last visit.   Patient Active Problem List   Diagnosis Date Noted  . Abdominal pain, left lower quadrant 06/21/2013   Current Outpatient Prescriptions on File Prior to Visit  Medication Sig Dispense Refill  . diazepam (VALIUM) 5 MG tablet Take 5 mg by mouth 2 (two) times daily as needed for muscle spasms.     . diclofenac (VOLTAREN) 75 MG EC tablet Take 1 tablet (75 mg total) by mouth 2 (two) times daily. 30 tablet 0  . FLUoxetine (PROZAC) 20 MG capsule Take 20 mg by mouth at bedtime. Give with the 40 mg to equal the 60 mg dose    . FLUoxetine (PROZAC) 40 MG capsule Take 40 mg by mouth at bedtime. Give with the 20 mg to equal 60 mg    . lisinopril (PRINIVIL,ZESTRIL) 20 MG tablet Take 20 mg by mouth at bedtime.    . meloxicam (MOBIC) 15 MG tablet Take 1 tablet (15 mg total) by mouth daily. 30 tablet 0  . methylPREDNISolone (MEDROL DOSEPAK) 4 MG TBPK tablet Take as instructed 21 tablet 0  . ondansetron (ZOFRAN ODT) 4 MG disintegrating tablet 4mg  ODT q4 hours prn nausea/vomit 4 tablet 0  . Oxycodone HCl 20 MG TABS Take 20 mg by mouth every 6 (six) hours as needed (pain).     . Verapamil HCl CR 300 MG CP24 Take 300 mg by mouth at bedtime.     Current Facility-Administered Medications on File Prior to Visit  Medication Dose Route Frequency Provider Last Rate Last Dose  . triamcinolone acetonide (KENALOG) 10 MG/ML injection 10 mg  10 mg Other Once [employer], DPM      . triamcinolone acetonide (KENALOG) 10 MG/ML injection 10 mg  10 mg Other Once [employer], DPM      . triamcinolone acetonide (KENALOG) 10 MG/ML injection 10 mg  10 mg Other Once Landis Martins, DPM       Allergies   Allergen Reactions  . Nexium [Esomeprazole Magnesium] Hives and Swelling  . Wellbutrin [Bupropion] Hives and Swelling   Objective:  No acute changes with physical exam  General:  Alert and oriented x 3, in no acute distress, obese ambulates with cane secondary to back which is fused  Dermatology: Skin is warm, dry, and supple bilateral. Nails are within normal limits. There is no lower extremity erythema, no eccymosis, no open lesions present bilateral.   Vascular: Dorsalis Pedis and Posterior Tibial pedal pulses are 2/4 bilateral. + hair growth noted bilateral. Capillary Fill Time is 3 seconds in all digits. No varicosities, No edema bilateral lower extremities.   Neurological: Sensation grossly intact to light touch with an achilles reflex of +2 and a  negative Tinel's sign bilateral. Vibratory, sharp/dull, Semmes Weinstein Monofilament within normal limits.   Musculoskeletal: There is tenderness to palpation at the medial calcaneal tubercale and through the insertion of the plantar fascia on the right foot. No pain with compression to calcaneus or application of tuning fork. Ankle and pedal joint range of motion  within normal limits bilateral. Strength 5/5 bilateral. Possible 1/4 inch right short limb secondary to spinal fusion. Pes Planus foot type bilateral.   Assessment and Plan: Problem List Items  Addressed This Visit    None    Visit Diagnoses    Plantar fasciitis of right foot    -  Primary    Foot pain, right        Acquired unequal limb length        Pes planus of both feet          -Patient seen -Patient picked up custom orthotics today with deep heel cup and 1/4 heel lift on right; provided by [facility] lab; Break in period explained. -Patient is awaiting Ultrasound of right foot to evaluate plantar fascia; patient presents as a more challenging case because of his back and hip problems as well as habitus.  -Continue with right hip mobility and strengthening exercises  daily.  -Patient to return to office after ultrasound for follow up evaluation or sooner if problems or questions arise.  Landis Martins, DPM

## 2015-09-10 ENCOUNTER — Other Ambulatory Visit: Payer: 59

## 2015-09-10 NOTE — Addendum Note (Signed)
Addended by: Landis Martins T on: 09/10/2015 10:11 AM   Modules accepted: Level of Service

## 2015-09-14 ENCOUNTER — Other Ambulatory Visit: Payer: 59

## 2015-09-17 DIAGNOSIS — J342 Deviated nasal septum: Secondary | ICD-10-CM | POA: Diagnosis not present

## 2015-09-17 DIAGNOSIS — H7403 Tympanosclerosis, bilateral: Secondary | ICD-10-CM | POA: Diagnosis not present

## 2015-09-17 DIAGNOSIS — H9193 Unspecified hearing loss, bilateral: Secondary | ICD-10-CM | POA: Diagnosis not present

## 2015-09-17 DIAGNOSIS — H9313 Tinnitus, bilateral: Secondary | ICD-10-CM | POA: Diagnosis not present

## 2015-09-17 MED FILL — FLUoxetine HCL 20 MG CAPS: 20 | 90 days supply | Qty: 90 | Fill #1

## 2015-09-18 ENCOUNTER — Other Ambulatory Visit: Payer: 59

## 2015-09-30 MED FILL — LISINOPRIL 20 MG TABLET: 20 | 30 days supply | Qty: 30 | Fill #1

## 2015-09-30 MED FILL — VERAPAMIL ER PM 300 MG CAP: 300 | 30 days supply | Qty: 30 | Fill #1

## 2015-09-30 MED FILL — METHOCARBAMOL 750 MG TABLET: 750 | 30 days supply | Qty: 90 | Fill #0

## 2015-10-06 DIAGNOSIS — M961 Postlaminectomy syndrome, not elsewhere classified: Secondary | ICD-10-CM | POA: Diagnosis not present

## 2015-10-06 DIAGNOSIS — M461 Sacroiliitis, not elsewhere classified: Secondary | ICD-10-CM | POA: Diagnosis not present

## 2015-10-06 DIAGNOSIS — Z79891 Long term (current) use of opiate analgesic: Secondary | ICD-10-CM | POA: Diagnosis not present

## 2015-10-06 DIAGNOSIS — G894 Chronic pain syndrome: Secondary | ICD-10-CM | POA: Diagnosis not present

## 2015-10-06 MED FILL — oxyCODONE HCL 15 MG TABS: 15 | 30 days supply | Qty: 150 | Fill #0

## 2015-11-02 MED FILL — LISINOPRIL 20 MG TABLET: 20 | 30 days supply | Qty: 30 | Fill #2

## 2015-11-02 MED FILL — VERAPAMIL ER PM 300 MG CAP: 300 | 30 days supply | Qty: 30 | Fill #2

## 2015-11-04 ENCOUNTER — Ambulatory Visit (INDEPENDENT_AMBULATORY_CARE_PROVIDER_SITE_OTHER): Payer: 59 | Admitting: Allergy and Immunology

## 2015-11-04 ENCOUNTER — Encounter: Payer: Self-pay | Admitting: Allergy and Immunology

## 2015-11-04 VITALS — BP 140/90 | HR 80 | Temp 99.1°F | Resp 20 | Ht 71.65 in | Wt 308.6 lb

## 2015-11-04 DIAGNOSIS — H9319 Tinnitus, unspecified ear: Secondary | ICD-10-CM | POA: Diagnosis not present

## 2015-11-04 DIAGNOSIS — K219 Gastro-esophageal reflux disease without esophagitis: Secondary | ICD-10-CM | POA: Diagnosis not present

## 2015-11-04 DIAGNOSIS — J309 Allergic rhinitis, unspecified: Secondary | ICD-10-CM | POA: Diagnosis not present

## 2015-11-04 DIAGNOSIS — J4520 Mild intermittent asthma, uncomplicated: Secondary | ICD-10-CM | POA: Diagnosis not present

## 2015-11-04 DIAGNOSIS — H6980 Other specified disorders of Eustachian tube, unspecified ear: Secondary | ICD-10-CM | POA: Diagnosis not present

## 2015-11-04 DIAGNOSIS — H1010 Acute atopic conjunctivitis, unspecified eye: Secondary | ICD-10-CM

## 2015-11-04 DIAGNOSIS — R42 Dizziness and giddiness: Secondary | ICD-10-CM

## 2015-11-04 MED ORDER — MONTELUKAST SODIUM 10 MG PO TABS: 10.0000 mg | ORAL_TABLET | Freq: Every day | ORAL | Status: DC

## 2015-11-04 MED ORDER — METHYLPREDNISOLONE ACETATE 80 MG/ML IJ SUSP
80.0000 mg | Freq: Once | INTRAMUSCULAR | Status: AC
  Administered 2015-11-04: 80 mg via INTRAMUSCULAR

## 2015-11-04 MED ORDER — ALBUTEROL SULFATE HFA 108 (90 BASE) MCG/ACT IN AERS: 2.0000 | INHALATION_SPRAY | RESPIRATORY_TRACT | Status: DC | PRN

## 2015-11-04 MED ORDER — OMEPRAZOLE 40 MG PO CPDR: 40.0000 mg | DELAYED_RELEASE_CAPSULE | Freq: Every day | ORAL | Status: DC

## 2015-11-04 MED ORDER — RANITIDINE HCL 300 MG PO TABS: 300.0000 mg | ORAL_TABLET | Freq: Every day | ORAL | Status: DC

## 2015-11-04 MED FILL — MONTELUKAST SOD 10 MG TAB: 10 | 30 days supply | Qty: 30 | Fill #0

## 2015-11-04 MED FILL — VENTOLIN HFA 90 MCG INHALER: 108 (90 BAS | 25 days supply | Qty: 18 | Fill #0

## 2015-11-04 MED FILL — raNITIdine HCL 300 MG TABS: 300 | 30 days supply | Qty: 30 | Fill #0

## 2015-11-04 MED FILL — OMEPRAZOLE DR 40 MG CAPSULE: 40 | 30 days supply | Qty: 30 | Fill #0

## 2015-11-04 NOTE — Patient Instructions (Addendum)
  1. Allergen avoidance measures - house dust mite mold  2. Start a course of immunotherapy  3. Treat inflammation:   A. OTC Rhinocort one spray each nostril one time per day. Coupon.  B. montelukast 10 mg one tablet one time per day  C. Depo-Medrol 80 IM delivered in clinic today  4. Treat reflux:   A. consolidate all caffeine consumption. Aim for none. Taper slowly.  B. omeprazole 40 mg in a.m.  C. ranitidine 300 mg in PM  5. If needed:   A. Ventolin HFA 2 puffs every 4-6 hours  B. OTC antihistamine - Claritin/Allegra/Zyrtec   6. Buffer the CPAP solution: 1/4 teaspoon salt + 1/4 teaspoon baking soda in 8 ounces distilled water  7. Follow through with hearing test  8. Return to clinic in 1 month or earlier if problem

## 2015-11-04 NOTE — Progress Notes (Signed)
Dear Dr. Unk Lightning,  Thank you for referring [name] to the [facility] on 11/04/2015.   Below is a summation of this patient's evaluation and recommendations.  Thank you for your referral. I will keep you informed about this patient's response to treatment.   If you have any questions please to do hestitate to contact me.   Sincerely,  Jiles Prows, MD [place]   ______________________________________________________________________    NEW PATIENT NOTE  Referring Provider: Myrlene Broker, MD Primary Provider: Myrlene Broker, MD Date of office visit: 11/04/2015    Subjective:   Chief Complaint:  [name] (DOB: 08-21-1970) is a 45 y.o. male with a chief complaint of Dizziness; Sinus Problem; Cough; and Wheezing  who presents to the clinic on 11/04/2015 with the following problems:  HPI Comments: [name] presents this clinic in evaluation of respiratory tract problems. I saw [name] back in this clinic approximately 7 years ago at which time he appeared to have mild intermittent asthma and allergic rhinoconjunctivitis with sensitivity against dust mite, cat, dog, and mold. Since that last visit with me he is continued to have problems with nasal congestion and stuffiness and sneezing. 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 He is seen Dr. Gaylyn Cheers about a month ago who recommended that he get a hearing test. He does have associated vertigo for the past 5 years. He also has a history of laryngopharyngeal reflux and he thinks his reflux is under pretty good control using a combination of omeprazole and ranitidine although he still occasionally has flareups. He does drink 3 Pepsi's per day. He is on a CPAP machine with  humidification which she thinks actually increases the problem associated with his ear. He does have constant throat clearing and postnasal drip and a glob stuck in his throat and occasional cough. Fortunately, his asthma has been under very good control with minimal requirement for short acting bronchodilator averaging out to about 1 time per month for a duration of approximately 2 or 3 days.   Past Medical History  Diagnosis Date  . Hyperlipidemia   . Hypertension     Dr. Allayne Butcher, [facility]  . Arthritis     back area  . Pancreatitis     hx of  . Depression     on prozac  . Sleep apnea     sleep study approx 18 months ago, uses cpap  . Chronic back pain   . Pneumonia     hx of  2005ish  . Anxiety   . Thrombocytopenia, idiopathic (HCC)     age of 31 months  . Complication of anesthesia     noticed some memory loss after surgery 07/18/12  . GERD (gastroesophageal reflux disease)     uses baking soda  . Blood dyscrasia     thrombocytopenia  15 months  . Dysrhythmia     PAF in 2012, felt related to OSA--now on CPAP; PRN follow-up Dr. Lin Givens 03/2011    Past Surgical History  Procedure Laterality Date  . Cholecystectomy    . Hernia repair Left     inguinal  . Back surgery      x2 fusions, L5-S1, L4-L5  . Tonsillectomy    .  Appendectomy      19 months of age  . Vasectomy    . Refractive surgery Bilateral 98  . Spinal cord stimulator implant  07/18/2012  . Spinal cord stimulator insertion  07/18/2012    Procedure: LUMBAR SPINAL CORD STIMULATOR INSERTION;  Surgeon: Melina Schools, MD;  Location: [place];  Service: Orthopedics;  Laterality: N/A;  trial spinal cord stimulator implant   . Spinal cord stimulator removal  07/26/2012    Procedure: LUMBAR SPINAL CORD STIMULATOR REMOVAL;  Surgeon: Melina Schools, MD;  Location: [place];  Service: Orthopedics;  Laterality: N/A;  REMOVAL OF TRIAL LEAD OR CONVERSION TO PERM SPINAL CORD STIMULATOR IMPLANT  .  Sacroiliac joint fusion Right 11/21/2013    Procedure: RIGHT SACROILIAC JOINT FUSION;  Surgeon: Sinclair Ship, MD;  Location: [place];  Service: Orthopedics;  Laterality: Right;  Right sided sacroiliac joint fusion  . Diagnostic laparoscopy      exploratory lab, abdominal  . Sacroiliac joint fusion Left 01/22/2014    Procedure: SACROILIAC JOINT FUSION;  Surgeon: Sinclair Ship, MD;  Location: [place];  Service: Orthopedics;  Laterality: Left;  Left sided sacroiliac joint fusion      Medication List           acetaminophen 500 MG tablet  Commonly known as:  TYLENOL  Take 500 mg by mouth 3 (three) times daily.     albuterol 108 (90 Base) MCG/ACT inhaler  Commonly known as:  PROVENTIL HFA;VENTOLIN HFA  Inhale 2 puffs into the lungs every 4 (four) hours as needed for wheezing or shortness of breath.     diazepam 5 MG tablet  Commonly known as:  VALIUM  Take 5 mg by mouth 2 (two) times daily as needed for muscle spasms.     diclofenac 75 MG EC tablet  Commonly known as:  VOLTAREN  Take 1 tablet (75 mg total) by mouth 2 (two) times daily.     FLUoxetine 20 MG capsule  Commonly known as:  PROZAC  Take 20 mg by mouth at bedtime. Give with the 40 mg to equal the 60 mg dose     FLUoxetine 40 MG capsule  Commonly known as:  PROZAC  Take 40 mg by mouth at bedtime. Give with the 20 mg to equal 60 mg     guaiFENesin 600 MG 12 hr tablet  Commonly known as:  MUCINEX  Take 600 mg by mouth 2 (two) times daily as needed.     ibuprofen 600 MG tablet  Commonly known as:  ADVIL,MOTRIN  Take 600 mg by mouth every 4 (four) hours.     lisinopril 20 MG tablet  Commonly known as:  PRINIVIL,ZESTRIL  Take 20 mg by mouth at bedtime.     meloxicam 15 MG tablet  Commonly known as:  MOBIC  Take 1 tablet (15 mg total) by mouth daily.     methocarbamol 750 MG tablet  Commonly known as:  ROBAXIN  Take 750 mg by mouth every 8 (eight) hours as needed for muscle spasms.      methylPREDNISolone 4 MG Tbpk tablet  Commonly known as:  MEDROL DOSEPAK  Take as instructed     omeprazole 20 MG capsule  Commonly known as:  PRILOSEC  Take 20 mg by mouth daily.     ondansetron 4 MG disintegrating tablet  Commonly known as:  ZOFRAN ODT  4mg  ODT q4 hours prn nausea/vomit     Oxycodone HCl 20 MG Tabs  Take 20 mg by  mouth every 6 (six) hours as needed (pain).     phenylephrine 10 MG Tabs tablet  Commonly known as:  SUDAFED PE  Take 10 mg by mouth daily.     ranitidine 75 MG tablet  Commonly known as:  ZANTAC  Take 75 mg by mouth daily.     Verapamil HCl CR 300 MG Cp24  Take 300 mg by mouth at bedtime.        Allergies  Allergen Reactions  . Nexium [Esomeprazole Magnesium] Hives and Swelling  . Wellbutrin [Bupropion] Hives and Swelling    Review of systems negative except as noted in HPI / PMHx or noted below:  Review of Systems  Constitutional: Negative.   HENT: Negative.   Eyes: Negative.   Respiratory: Negative.   Cardiovascular: Negative.   Gastrointestinal: Negative.   Genitourinary: Negative.   Musculoskeletal: Negative.   Skin: Negative.   Neurological: Negative.   Endo/Heme/Allergies: Negative.   Psychiatric/Behavioral: Negative.     History reviewed. No pertinent family history.  Social History   Social History  . Marital Status: Married    Spouse Name: N/A  . Number of Children: N/A  . Years of Education: N/A   Occupational History  . Not on file.   Social History Main Topics  . Smoking status: Former Smoker -- 1.00 packs/day for 18 years    Types: Cigarettes    Quit date: 08/08/2009  . Smokeless tobacco: Former Systems developer  . Alcohol Use: No  . Drug Use: No  . Sexual Activity: Yes   Other Topics Concern  . Not on file   Social History Narrative    Environmental and Social history  Lives in a house with a dry environment, no animals located inside the household, carpeting in the bedroom, no plastic on the bed or  pillow, and no smokers located inside the household. He is disabled from a lower back problem   Objective:   Filed Vitals:   11/04/15 1401  BP: 140/90  Pulse: 80  Temp: 99.1 F (37.3 C)  Resp: 20   Height: 5' 11.65" (182 cm) Weight: (!) 308 lb 10.3 oz (140 kg)  Physical Exam  Constitutional: He is well-developed, well-nourished, and in no distress.  HENT:  Head: Normocephalic.  Right Ear: External ear and ear canal normal. Tympanic membrane is scarred.  Left Ear: External ear and ear canal normal. Tympanic membrane is scarred.  Nose: Nose normal. No mucosal edema or rhinorrhea.  Mouth/Throat: Uvula is midline, oropharynx is clear and moist and mucous membranes are normal. No oropharyngeal exudate.  Eyes: Conjunctivae are normal.  Neck: Trachea normal. No tracheal tenderness present. No tracheal deviation present. No thyromegaly present.  Cardiovascular: Normal rate, regular rhythm, S1 normal, S2 normal and normal heart sounds.   No murmur heard. Pulmonary/Chest: Breath sounds normal. No stridor. No respiratory distress. He has no wheezes. He has no rales.  Musculoskeletal: He exhibits no edema.  Lymphadenopathy:       Head (right side): No tonsillar adenopathy present.       Head (left side): No tonsillar adenopathy present.    He has no cervical adenopathy.    He has no axillary adenopathy.  Neurological: He is alert. Gait normal.  Skin: No rash noted. He is not diaphoretic. No erythema. Nails show no clubbing.  Psychiatric: Mood and affect normal.     Diagnostics: Previous skin tests obtained April 2008 identified hypersensitivity against dust mite, cat, dog, and molds.  Spirometry was performed and demonstrated an  FEV1 of 4.18 @ 96 % of predicted.  The patient had an Asthma Control Test with the following results:  .     Assessment and Plan:    1. Allergic rhinoconjunctivitis   2. ETD (eustachian tube dysfunction), unspecified laterality   3. Vertigo   4.  Tinnitus, unspecified laterality   5. Asthma, mild intermittent, well-controlled   6. Gastroesophageal reflux disease, esophagitis presence not specified      1. Allergen avoidance measures - house dust mite mold  2. Start a course of immunotherapy  3. Treat inflammation:   A. OTC Rhinocort one spray each nostril one time per day. Coupon.  B. montelukast 10 mg one tablet one time per day  C. Depo-Medrol 80 IM delivered in clinic today  4. Treat reflux:   A. consolidate all caffeine consumption. Aim for none. Taper slowly.  B. omeprazole 40 mg in a.m.  C. ranitidine 300 mg in PM  5. If needed:   A. Ventolin HFA 2 puffs every 4-6 hours  B. OTC antihistamine - Claritin/Allegra/Zyrtec   6. Buffer the CPAP solution: 1/4 teaspoon salt + 1/4 teaspoon baking soda in 8 ounces distilled water  7. Follow through with hearing test  8. 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 I've encouraged him to follow through with a hearing test with Dr. Gaylyn Cheers suggested. With his history of vertigo and hearing loss and tinnitus one must always be worried about the possibility of Mnire's disease of the am pretty sure that this has been evaluated by Dr. Gaylyn Cheers and Dr. Melina Modena in the past. He does have some symptoms consistent with ongoing laryngopharyngeal reflux and we'll have him consolidate his caffeine consumption while he continues to use his omeprazole and ranitidine. As well, we'll have him better his CPAP solution as plain water may be causing some irritation of his airway. I'll regroup with him in 4 weeks or so to assess his response therapy and consider further evaluation and treatment based upon his response.   Jiles Prows,  MD [facility]

## 2015-11-05 DIAGNOSIS — Z79891 Long term (current) use of opiate analgesic: Secondary | ICD-10-CM | POA: Diagnosis not present

## 2015-11-05 DIAGNOSIS — M461 Sacroiliitis, not elsewhere classified: Secondary | ICD-10-CM | POA: Diagnosis not present

## 2015-11-05 DIAGNOSIS — M961 Postlaminectomy syndrome, not elsewhere classified: Secondary | ICD-10-CM | POA: Diagnosis not present

## 2015-11-05 DIAGNOSIS — G894 Chronic pain syndrome: Secondary | ICD-10-CM | POA: Diagnosis not present

## 2015-11-05 MED FILL — oxyCODONE HCL 15 MG TABS: 15 | 30 days supply | Qty: 150 | Fill #0

## 2015-11-11 DIAGNOSIS — Z0001 Encounter for general adult medical examination with abnormal findings: Secondary | ICD-10-CM | POA: Diagnosis not present

## 2015-11-11 DIAGNOSIS — Z0000 Encounter for general adult medical examination without abnormal findings: Secondary | ICD-10-CM | POA: Diagnosis not present

## 2015-11-11 MED FILL — CYANOCOBALAMIN 1,000 MCG/ML: 1000 | 84 days supply | Qty: 6 | Fill #0

## 2015-11-12 MED FILL — FLUoxetine HCL 40 MG CAPS: 40 | 90 days supply | Qty: 90 | Fill #1

## 2015-11-17 MED FILL — FENOFIBRATE 160 MG TABLET: 160 | 90 days supply | Qty: 90 | Fill #0

## 2015-11-24 ENCOUNTER — Ambulatory Visit
Admission: RE | Admit: 2015-11-24 | Discharge: 2015-11-24 | Disposition: A | Discharge status: Home / Self Care | Payer: 59 | Source: Ambulatory Visit | Attending: Sports Medicine | Admitting: Sports Medicine

## 2015-11-24 DIAGNOSIS — M722 Plantar fascial fibromatosis: Secondary | ICD-10-CM

## 2015-12-03 DIAGNOSIS — M961 Postlaminectomy syndrome, not elsewhere classified: Secondary | ICD-10-CM | POA: Diagnosis not present

## 2015-12-03 DIAGNOSIS — Z79891 Long term (current) use of opiate analgesic: Secondary | ICD-10-CM | POA: Diagnosis not present

## 2015-12-03 DIAGNOSIS — F4322 Adjustment disorder with anxiety: Secondary | ICD-10-CM | POA: Diagnosis not present

## 2015-12-03 DIAGNOSIS — G894 Chronic pain syndrome: Secondary | ICD-10-CM | POA: Diagnosis not present

## 2015-12-03 MED FILL — oxyCODONE HCL 10 MG TABS: 10 | 30 days supply | Qty: 150 | Fill #0

## 2015-12-03 MED FILL — diazePAM 5 MG TABS: 5 | 30 days supply | Qty: 60 | Fill #0 | Status: TO

## 2015-12-07 ENCOUNTER — Ambulatory Visit: Payer: 59 | Admitting: Allergy and Immunology

## 2015-12-07 ENCOUNTER — Ambulatory Visit: Payer: 59

## 2015-12-08 MED FILL — LISINOPRIL 20 MG TABLET: 20 | 30 days supply | Qty: 30 | Fill #3

## 2015-12-08 MED FILL — MONTELUKAST SOD 10 MG TAB: 10 | 30 days supply | Qty: 30 | Fill #1

## 2015-12-08 MED FILL — OMEPRAZOLE DR 20 MG CAPSULE: 20 | 90 days supply | Qty: 90 | Fill #0

## 2015-12-08 MED FILL — VERAPAMIL ER PM 300 MG CAP: 300 | 30 days supply | Qty: 30 | Fill #3

## 2015-12-10 MED FILL — DEPO-TESTOSTERONE 200 MG/ML: 200 | 84 days supply | Qty: 6 | Fill #1

## 2015-12-10 MED FILL — METHOCARBAMOL 750 MG TABLET: 750 | 30 days supply | Qty: 90 | Fill #1

## 2015-12-11 ENCOUNTER — Telehealth: Payer: Self-pay | Admitting: *Deleted

## 2015-12-11 ENCOUNTER — Ambulatory Visit: Payer: 59 | Admitting: Allergy and Immunology

## 2015-12-11 NOTE — Telephone Encounter (Signed)
Pt called verify that the results of his [place] were available for Dr. Cannon Kettle to review with him on 12/16/2015.  I told pt they were in EPIC system and Dr. Cannon Kettle could evaluated.

## 2015-12-16 ENCOUNTER — Ambulatory Visit (INDEPENDENT_AMBULATORY_CARE_PROVIDER_SITE_OTHER): Payer: 59 | Admitting: Sports Medicine

## 2015-12-16 ENCOUNTER — Encounter: Payer: Self-pay | Admitting: Sports Medicine

## 2015-12-16 DIAGNOSIS — M79671 Pain in right foot: Secondary | ICD-10-CM

## 2015-12-16 DIAGNOSIS — M2142 Flat foot [pes planus] (acquired), left foot: Secondary | ICD-10-CM | POA: Diagnosis not present

## 2015-12-16 DIAGNOSIS — M2170 Unequal limb length (acquired), unspecified site: Secondary | ICD-10-CM | POA: Diagnosis not present

## 2015-12-16 DIAGNOSIS — M2141 Flat foot [pes planus] (acquired), right foot: Secondary | ICD-10-CM | POA: Diagnosis not present

## 2015-12-16 DIAGNOSIS — M722 Plantar fascial fibromatosis: Secondary | ICD-10-CM | POA: Diagnosis not present

## 2015-12-16 NOTE — Progress Notes (Signed)
Patient ID: [name], male   DOB: 1971/05/29, 45 y.o.   MRN: [id number]  Subjective: [name] is a 45 y.o. male returns to office for discussion of ultrasound results of right plantar fascia. Patient denies any recent changes in medications or new problems since last visit.   Patient Active Problem List   Diagnosis Date Noted  . Abdominal pain, left lower quadrant 06/21/2013   Current Outpatient Prescriptions on File Prior to Visit  Medication Sig Dispense Refill  . acetaminophen (TYLENOL) 500 MG tablet Take 500 mg by mouth 3 (three) times daily.    Marland Kitchen albuterol (PROVENTIL HFA;VENTOLIN HFA) 108 (90 Base) MCG/ACT inhaler Inhale 2 puffs into the lungs every 4 (four) hours as needed for wheezing or shortness of breath.    Marland Kitchen albuterol (VENTOLIN HFA) 108 (90 Base) MCG/ACT inhaler Inhale 2 puffs into the lungs every 4 (four) hours as needed for wheezing or shortness of breath. 1 Inhaler 5  . diazepam (VALIUM) 5 MG tablet Take 5 mg by mouth 2 (two) times daily as needed for muscle spasms.     . diclofenac (VOLTAREN) 75 MG EC tablet Take 1 tablet (75 mg total) by mouth 2 (two) times daily. (Patient not taking: Reported on 11/04/2015) 30 tablet 0  . FLUoxetine (PROZAC) 20 MG capsule Take 20 mg by mouth at bedtime. Give with the 40 mg to equal the 60 mg dose    . FLUoxetine (PROZAC) 40 MG capsule Take 40 mg by mouth at bedtime. Give with the 20 mg to equal 60 mg    . guaiFENesin (MUCINEX) 600 MG 12 hr tablet Take 600 mg by mouth 2 (two) times daily as needed.    Marland Kitchen ibuprofen (ADVIL,MOTRIN) 600 MG tablet Take 600 mg by mouth every 4 (four) hours.    Marland Kitchen lisinopril (PRINIVIL,ZESTRIL) 20 MG tablet Take 20 mg by mouth at bedtime.    . meloxicam (MOBIC) 15 MG tablet Take 1 tablet (15 mg total) by mouth daily. (Patient not taking: Reported on 11/04/2015) 30 tablet 0  . methocarbamol (ROBAXIN) 750 MG tablet Take 750 mg by mouth every 8 (eight) hours as needed for muscle spasms.    .  methylPREDNISolone (MEDROL DOSEPAK) 4 MG TBPK tablet Take as instructed 21 tablet 0  . montelukast (SINGULAIR) 10 MG tablet Take 1 tablet (10 mg total) by mouth at bedtime. 30 tablet 5  . omeprazole (PRILOSEC) 20 MG capsule Take 20 mg by mouth daily.    Marland Kitchen omeprazole (PRILOSEC) 40 MG capsule Take 1 capsule (40 mg total) by mouth daily. 30 capsule 5  . ondansetron (ZOFRAN ODT) 4 MG disintegrating tablet 4mg  ODT q4 hours prn nausea/vomit (Patient not taking: Reported on 11/04/2015) 4 tablet 0  . Oxycodone HCl 20 MG TABS Take 20 mg by mouth every 6 (six) hours as needed (pain).     . phenylephrine (SUDAFED PE) 10 MG TABS tablet Take 10 mg by mouth daily.    . ranitidine (ZANTAC) 300 MG tablet Take 1 tablet (300 mg total) by mouth at bedtime. 30 tablet 5  . ranitidine (ZANTAC) 75 MG tablet Take 75 mg by mouth daily.    . Verapamil HCl CR 300 MG CP24 Take 300 mg by mouth at bedtime.     Current Facility-Administered Medications on File Prior to Visit  Medication Dose Route Frequency Provider Last Rate Last Dose  . triamcinolone acetonide (KENALOG) 10 MG/ML injection 10 mg  10 mg Other Once [employer], DPM      .  triamcinolone acetonide (KENALOG) 10 MG/ML injection 10 mg  10 mg Other Once [employer], DPM      . triamcinolone acetonide (KENALOG) 10 MG/ML injection 10 mg  10 mg Other Once Landis Martins, DPM       Allergies  Allergen Reactions  . Nexium [Esomeprazole Magnesium] Hives and Swelling  . Wellbutrin [Bupropion] Hives and Swelling   Objective:  No acute changes with physical exam  General:  Alert and oriented x 3, in no acute distress, obese ambulates with cane secondary to back which is fused  Dermatology: Skin is warm, dry, and supple bilateral. Nails are within normal limits. There is no lower extremity erythema, no eccymosis, no open lesions present bilateral.   Vascular: Dorsalis Pedis and Posterior Tibial pedal pulses are 2/4 bilateral. + hair growth noted bilateral.  Capillary Fill Time is 3 seconds in all digits. No varicosities, No edema bilateral lower extremities.   Neurological: Sensation grossly intact to light touch with an achilles reflex of +2 and a  negative Tinel's sign bilateral. Vibratory, sharp/dull, Semmes Weinstein Monofilament within normal limits.   Musculoskeletal: There is tenderness to palpation at the medial calcaneal tubercale and through the insertion of the plantar fascia on the right foot. No pain with compression to calcaneus or application of tuning fork. Ankle and pedal joint range of motion  within normal limits bilateral. Strength 5/5 bilateral. Possible 1/4 inch right short limb secondary to spinal fusion. Pes Planus foot type bilateral.   Assessment and Plan: Problem List Items Addressed This Visit    None    Visit Diagnoses    Plantar fasciitis of right foot    -  Primary    Foot pain, right        Acquired unequal limb length        Pes planus of both feet          -Patient seen and examined -[place] results reviewed consistent with calcified plantar fasciitis -Patient desires to have EPAT; will have office to contact patient to arrange -Cont with custom orthotics -Continue with right hip mobility and strengthening exercises daily.  -Patient to return to office for EPAT #1 in [place] or sooner if problems or questions arise.  Landis Martins, DPM

## 2015-12-22 ENCOUNTER — Ambulatory Visit (INDEPENDENT_AMBULATORY_CARE_PROVIDER_SITE_OTHER): Payer: 59

## 2015-12-22 DIAGNOSIS — M722 Plantar fascial fibromatosis: Secondary | ICD-10-CM

## 2015-12-22 NOTE — Progress Notes (Signed)
   Subjective:    Patient ID: [name], male    DOB: 1970-12-02, 45 y.o.   MRN: [id number]  HPI  Pt presents stating that he has pain in his right heel  Review of Systems    All other systems negative Objective:   Physical Exam  Pain upon palpation medial right heel 5 of 10 on pain scale      Assessment & Plan:  ESWR therapy administered to medial aspect of Rt heel at 35.93 joules, 0.55 energy for 3000 pulses. EPAT was delivered to surrounding connective tissues and plantar fascial band for 3000 pulses. Pt tolerated treatment well, stating his pain never exceeded 4 of 10. Advised against NSAIDs and ice. Re-appointed in 1 week for 2nd treatment

## 2015-12-23 ENCOUNTER — Telehealth: Payer: Self-pay | Admitting: *Deleted

## 2015-12-23 NOTE — Telephone Encounter (Signed)
Pt states he had shockwave yesterday, and could not remember if he should ice or apply heat.  I told pt not to use ice, or heat, and not to take antiinflammatories.

## 2015-12-28 MED FILL — FLUoxetine HCL 20 MG CAPS: 20 | 90 days supply | Qty: 90 | Fill #0

## 2015-12-29 ENCOUNTER — Ambulatory Visit (INDEPENDENT_AMBULATORY_CARE_PROVIDER_SITE_OTHER): Payer: 59

## 2015-12-29 DIAGNOSIS — M722 Plantar fascial fibromatosis: Secondary | ICD-10-CM

## 2015-12-29 NOTE — Progress Notes (Signed)
   Subjective:    Patient ID: [name], male    DOB: [birth year], 44 y.o.   MRN: [id number]  HPI  Pt presents stating that he has pain in his right heel  Review of Systems    All other systems negative Objective:   Physical Exam  Pain upon palpation medial right heel 5 of 10 on pain scale      Assessment & Plan:  ESWR therapy administered to medial aspect of Rt heel at 40 joules, 0.55 energy for 3000 pulses. EPAT was delivered to surrounding connective tissues and plantar fascial band for 3000 pulses. Pt tolerated treatment well, stating his pain never exceeded 4 of 10. Advised against NSAIDs and ice. Re-appointed in 1 week for 3rd treatment

## 2015-12-31 ENCOUNTER — Ambulatory Visit: Payer: 59 | Admitting: Allergy and Immunology

## 2015-12-31 DIAGNOSIS — Z79891 Long term (current) use of opiate analgesic: Secondary | ICD-10-CM | POA: Diagnosis not present

## 2015-12-31 DIAGNOSIS — F4322 Adjustment disorder with anxiety: Secondary | ICD-10-CM | POA: Diagnosis not present

## 2015-12-31 DIAGNOSIS — G894 Chronic pain syndrome: Secondary | ICD-10-CM | POA: Diagnosis not present

## 2015-12-31 DIAGNOSIS — M961 Postlaminectomy syndrome, not elsewhere classified: Secondary | ICD-10-CM | POA: Diagnosis not present

## 2015-12-31 MED FILL — oxyCODONE HCL 10 MG TABS: 10 | 30 days supply | Qty: 180 | Fill #0

## 2016-01-05 MED FILL — MONTELUKAST SOD 10 MG TAB: 10 | 30 days supply | Qty: 30 | Fill #2

## 2016-01-05 MED FILL — LISINOPRIL 20 MG TABLET: 20 | 90 days supply | Qty: 90 | Fill #4

## 2016-01-05 MED FILL — VERAPAMIL ER PM 300 MG CAP: 300 | 30 days supply | Qty: 30 | Fill #4

## 2016-01-06 ENCOUNTER — Other Ambulatory Visit: Payer: 59

## 2016-01-06 ENCOUNTER — Ambulatory Visit (INDEPENDENT_AMBULATORY_CARE_PROVIDER_SITE_OTHER): Payer: 59 | Admitting: Sports Medicine

## 2016-01-06 ENCOUNTER — Encounter: Payer: Self-pay | Admitting: Sports Medicine

## 2016-01-06 DIAGNOSIS — M722 Plantar fascial fibromatosis: Secondary | ICD-10-CM

## 2016-01-06 NOTE — Progress Notes (Signed)
Patient ID: DAID TRESCH, male   DOB: [birth year], 45 y.o.   MRN: [id number]  Subjective: [name] is a 45 y.o. male returns to office for evaluation reports that he is having increased pain after EPAT. Has had 2 treatments, report that he is tolerating the treatment well however a few days after has a lot of pain. States that he has taken tylenol but has not use heat, ice, or other anti-inflammatories.   Patient Active Problem List   Diagnosis Date Noted  . Abdominal pain, left lower quadrant 06/21/2013   Current Outpatient Prescriptions on File Prior to Visit  Medication Sig Dispense Refill  . acetaminophen (TYLENOL) 500 MG tablet Take 500 mg by mouth 3 (three) times daily.    Marland Kitchen albuterol (PROVENTIL HFA;VENTOLIN HFA) 108 (90 Base) MCG/ACT inhaler Inhale 2 puffs into the lungs every 4 (four) hours as needed for wheezing or shortness of breath.    Marland Kitchen albuterol (VENTOLIN HFA) 108 (90 Base) MCG/ACT inhaler Inhale 2 puffs into the lungs every 4 (four) hours as needed for wheezing or shortness of breath. 1 Inhaler 5  . diazepam (VALIUM) 5 MG tablet Take 5 mg by mouth 2 (two) times daily as needed for muscle spasms.     . diclofenac (VOLTAREN) 75 MG EC tablet Take 1 tablet (75 mg total) by mouth 2 (two) times daily. 30 tablet 0  . FLUoxetine (PROZAC) 20 MG capsule Take 20 mg by mouth at bedtime. Give with the 40 mg to equal the 60 mg dose    . FLUoxetine (PROZAC) 40 MG capsule Take 40 mg by mouth at bedtime. Give with the 20 mg to equal 60 mg    . guaiFENesin (MUCINEX) 600 MG 12 hr tablet Take 600 mg by mouth 2 (two) times daily as needed.    Marland Kitchen ibuprofen (ADVIL,MOTRIN) 600 MG tablet Take 600 mg by mouth every 4 (four) hours.    Marland Kitchen lisinopril (PRINIVIL,ZESTRIL) 20 MG tablet Take 20 mg by mouth at bedtime.    . meloxicam (MOBIC) 15 MG tablet Take 1 tablet (15 mg total) by mouth daily. 30 tablet 0  . methocarbamol (ROBAXIN) 750 MG tablet Take 750 mg by mouth every 8 (eight) hours as needed for  muscle spasms.    . methylPREDNISolone (MEDROL DOSEPAK) 4 MG TBPK tablet Take as instructed 21 tablet 0  . montelukast (SINGULAIR) 10 MG tablet Take 1 tablet (10 mg total) by mouth at bedtime. 30 tablet 5  . omeprazole (PRILOSEC) 20 MG capsule Take 20 mg by mouth daily.    Marland Kitchen omeprazole (PRILOSEC) 40 MG capsule Take 1 capsule (40 mg total) by mouth daily. 30 capsule 5  . ondansetron (ZOFRAN ODT) 4 MG disintegrating tablet 4mg  ODT q4 hours prn nausea/vomit 4 tablet 0  . Oxycodone HCl 20 MG TABS Take 20 mg by mouth every 6 (six) hours as needed (pain).     . phenylephrine (SUDAFED PE) 10 MG TABS tablet Take 10 mg by mouth daily.    . ranitidine (ZANTAC) 300 MG tablet Take 1 tablet (300 mg total) by mouth at bedtime. 30 tablet 5  . ranitidine (ZANTAC) 75 MG tablet Take 75 mg by mouth daily.    . Verapamil HCl CR 300 MG CP24 Take 300 mg by mouth at bedtime.     Current Facility-Administered Medications on File Prior to Visit  Medication Dose Route Frequency Provider Last Rate Last Dose  . triamcinolone acetonide (KENALOG) 10 MG/ML injection 10 mg  10 mg  Other Once [employer], DPM      . triamcinolone acetonide (KENALOG) 10 MG/ML injection 10 mg  10 mg Other Once [employer], DPM      . triamcinolone acetonide (KENALOG) 10 MG/ML injection 10 mg  10 mg Other Once Landis Martins, DPM       Allergies  Allergen Reactions  . Nexium [Esomeprazole Magnesium] Hives and Swelling  . Wellbutrin [Bupropion] Hives and Swelling   Objective:  No acute changes with physical exam  General:  Alert and oriented x 3, in no acute distress, obese ambulates with cane secondary to back which is fused  Dermatology: Skin is warm, dry, and supple bilateral. Nails are within normal limits. There is no lower extremity erythema, no eccymosis, no open lesions present bilateral. No signs of infection.   Vascular: Dorsalis Pedis and Posterior Tibial pedal pulses are 2/4 bilateral. + hair growth noted bilateral.  Capillary Fill Time is 3 seconds in all digits. No varicosities, No edema bilateral lower extremities.   Neurological: Sensation grossly intact to light touch with an achilles reflex of +2 and a  negative Tinel's sign bilateral. Vibratory, sharp/dull, Semmes Weinstein Monofilament within normal limits.   Musculoskeletal: There is mild tenderness to palpation at the medial calcaneal tubercale and through the insertion of the plantar fascia on the right foot. No pain with compression to calcaneus or application of tuning fork. Ankle and pedal joint range of motion  within normal limits bilateral. Strength 5/5 bilateral. Possible 1/4 inch right short limb secondary to spinal fusion. Pes Planus foot type bilateral.   Assessment and Plan: Problem List Items Addressed This Visit    None    Visit Diagnoses    Plantar fasciitis of right foot    -  Primary      -Patient seen and examined -Educated patient symptoms after EPAT and advised patient that pain is expected after EPAT and that it is expected to have a flare in response to the inflammation cycle that is occuring after treatment -Cont with EPAT; Patient to have # 3 treatment re-scheduled for this week -Offer post op shoes to patient of which he declined; Advised patient that if pain is bothersome to consider using post op shoes -Cont with NO ICE, NO HEAT, NO ANTI-INFLAMMATORIES  -Cont with good supportive shoes and custom orthotics in the meantime -Continue with plantar fascial and right hip mobility and strengthening exercises daily.  -Patient to return to office for EPAT #3 in [place] or sooner if problems or questions arise.  Landis Martins, DPM

## 2016-01-14 ENCOUNTER — Ambulatory Visit: Payer: 59

## 2016-01-14 ENCOUNTER — Ambulatory Visit: Payer: 59 | Admitting: Sports Medicine

## 2016-01-14 ENCOUNTER — Encounter: Payer: Self-pay | Admitting: Sports Medicine

## 2016-01-14 ENCOUNTER — Ambulatory Visit (INDEPENDENT_AMBULATORY_CARE_PROVIDER_SITE_OTHER): Payer: 59 | Admitting: Sports Medicine

## 2016-01-14 ENCOUNTER — Other Ambulatory Visit: Payer: 59

## 2016-01-14 DIAGNOSIS — M722 Plantar fascial fibromatosis: Secondary | ICD-10-CM

## 2016-01-14 DIAGNOSIS — M79671 Pain in right foot: Secondary | ICD-10-CM

## 2016-01-14 MED FILL — METHOCARBAMOL 750 MG TABLET: 750 | 30 days supply | Qty: 90 | Fill #0

## 2016-01-14 NOTE — Progress Notes (Addendum)
Patient ID: [name], male   DOB: 03-19-1971, 45 y.o.   MRN: [id number]  Subjective: [name] is a 45 y.o. male returns to office again for evaluation reports that he is having increased pain after EPAT. Has had 2 treatments with the last treatment being 2 weeks ago, reports that he tolerates the treatment well however he still is getting a lot of pain pinpoint to the heel on the right. States that he has taken tylenol but has not used heat, ice, or other anti-inflammatories. Denies any other pedal complaints  Patient Active Problem List   Diagnosis Date Noted  . Abdominal pain, left lower quadrant 06/21/2013   Current Outpatient Prescriptions on File Prior to Visit  Medication Sig Dispense Refill  . acetaminophen (TYLENOL) 500 MG tablet Take 500 mg by mouth 3 (three) times daily.    Marland Kitchen albuterol (PROVENTIL HFA;VENTOLIN HFA) 108 (90 Base) MCG/ACT inhaler Inhale 2 puffs into the lungs every 4 (four) hours as needed for wheezing or shortness of breath.    Marland Kitchen albuterol (VENTOLIN HFA) 108 (90 Base) MCG/ACT inhaler Inhale 2 puffs into the lungs every 4 (four) hours as needed for wheezing or shortness of breath. 1 Inhaler 5  . diazepam (VALIUM) 5 MG tablet Take 5 mg by mouth 2 (two) times daily as needed for muscle spasms.     . diclofenac (VOLTAREN) 75 MG EC tablet Take 1 tablet (75 mg total) by mouth 2 (two) times daily. 30 tablet 0  . FLUoxetine (PROZAC) 20 MG capsule Take 20 mg by mouth at bedtime. Give with the 40 mg to equal the 60 mg dose    . FLUoxetine (PROZAC) 40 MG capsule Take 40 mg by mouth at bedtime. Give with the 20 mg to equal 60 mg    . guaiFENesin (MUCINEX) 600 MG 12 hr tablet Take 600 mg by mouth 2 (two) times daily as needed.    Marland Kitchen ibuprofen (ADVIL,MOTRIN) 600 MG tablet Take 600 mg by mouth every 4 (four) hours.    Marland Kitchen lisinopril (PRINIVIL,ZESTRIL) 20 MG tablet Take 20 mg by mouth at bedtime.    . meloxicam (MOBIC) 15 MG tablet Take 1 tablet (15 mg total) by mouth  daily. 30 tablet 0  . methocarbamol (ROBAXIN) 750 MG tablet Take 750 mg by mouth every 8 (eight) hours as needed for muscle spasms.    . methylPREDNISolone (MEDROL DOSEPAK) 4 MG TBPK tablet Take as instructed 21 tablet 0  . montelukast (SINGULAIR) 10 MG tablet Take 1 tablet (10 mg total) by mouth at bedtime. 30 tablet 5  . omeprazole (PRILOSEC) 20 MG capsule Take 20 mg by mouth daily.    Marland Kitchen omeprazole (PRILOSEC) 40 MG capsule Take 1 capsule (40 mg total) by mouth daily. 30 capsule 5  . ondansetron (ZOFRAN ODT) 4 MG disintegrating tablet 4mg  ODT q4 hours prn nausea/vomit 4 tablet 0  . Oxycodone HCl 20 MG TABS Take 20 mg by mouth every 6 (six) hours as needed (pain).     . phenylephrine (SUDAFED PE) 10 MG TABS tablet Take 10 mg by mouth daily.    . ranitidine (ZANTAC) 300 MG tablet Take 1 tablet (300 mg total) by mouth at bedtime. 30 tablet 5  . ranitidine (ZANTAC) 75 MG tablet Take 75 mg by mouth daily.    . Verapamil HCl CR 300 MG CP24 Take 300 mg by mouth at bedtime.     Current Facility-Administered Medications on File Prior to Visit  Medication Dose Route Frequency  Provider Last Rate Last Dose  . triamcinolone acetonide (KENALOG) 10 MG/ML injection 10 mg  10 mg Other Once [employer], DPM      . triamcinolone acetonide (KENALOG) 10 MG/ML injection 10 mg  10 mg Other Once [employer], DPM      . triamcinolone acetonide (KENALOG) 10 MG/ML injection 10 mg  10 mg Other Once Landis Martins, DPM       Allergies  Allergen Reactions  . Nexium [Esomeprazole Magnesium] Hives and Swelling  . Wellbutrin [Bupropion] Hives and Swelling   Objective:  No acute changes with physical exam  General:  Alert and oriented x 3, in no acute distress, obese ambulates with cane secondary to back which is fused  Dermatology: Skin is warm, dry, and supple bilateral. Nails are within normal limits. There is no lower extremity erythema, no eccymosis, no open lesions present bilateral. No signs of infection.    Vascular: Dorsalis Pedis and Posterior Tibial pedal pulses are 2/4 bilateral. + hair growth noted bilateral. Capillary Fill Time is 3 seconds in all digits. No varicosities, No edema bilateral lower extremities.   Neurological: Sensation grossly intact to light touch with an achilles reflex of +2 and a  negative Tinel's sign bilateral. Vibratory, sharp/dull, Semmes Weinstein Monofilament within normal limits.   Musculoskeletal: There is mild tenderness to palpation at the medial calcaneal tubercale and through the insertion of the plantar fascia on the right foot; pain is worsened when patient actually bears weight on to the heel. No pain with compression to calcaneus or application of tuning fork. Ankle and pedal joint range of motion  within normal limits bilateral. Strength 5/5 bilateral. Possible 1/4 inch right short limb secondary to spinal fusion. Pes Planus foot type bilateral.   X-rays right foot. Normal osseous mineralization, significant thickening of plantar fascia and unchanged. Calcaneal spur. No fracture or dislocation, no concern of stress injury. No other acute findings.   Assessment and Plan: Problem List Items Addressed This Visit    None    Visit Diagnoses    Right foot pain    -  Primary    Relevant Orders    DG Foot 2 Views Right    Plantar fasciitis of right foot          -Patient seen and examined -X-rays reviewed -Educated patient symptoms after EPAT and advised patient that pain is expected after EPAT and that it is expected to have a flare in response to the inflammation cycle that is occuring after treatment;Educated patient on need of continuation of EPAT to get the most benefit without interruption in treatment time frame. Patient now has delayed EPAT by 2 weeks -For symptomatic relief, injected local anesthetic at plantar fascia, right heel without complication 6 mL total of 11 mixture of 1% lidocaine plain and 0.5% Marcaine plain. Advised patient that this is  temporary for symptomatic relief -Cont with EPAT; Patient to have # 3 treatment re-scheduled for next available -Re offered post op shoes to patient of which he declined; Advised patient that if pain is bothersome to consider using post op shoes -Cont with NO ICE, NO HEAT, NO ANTI-INFLAMMATORIES  -Cont with good supportive shoes and custom orthotics in the meantime -Continue with plantar fascial and right hip mobility and strengthening exercises daily especially for iliopsoas and adductor muscles.   -Patient to return to office for EPAT #3 in [place] or sooner if problems or questions arise.  Landis Martins, DPM

## 2016-01-15 NOTE — Progress Notes (Signed)
   Subjective:    Patient ID: [name], male    DOB: Feb 20, 1971, 45 y.o.   MRN: [id number]  HPI  Pt presents stating that he has pain in his right heel  Review of Systems    All other systems negative Objective:   Physical Exam  Pain upon palpation medial right heel 5 of 10 on pain scale      Assessment & Plan:  ESWR therapy administered to medial aspect of Rt heel at 40 joules, 0.55 energy for 3000 pulses. EPAT was delivered to surrounding connective tissues and plantar fascial band for 3000 pulses. Pt tolerated treatment well, stating his pain never exceeded 4 of 10. Advised against NSAIDs and ice.

## 2016-01-27 ENCOUNTER — Ambulatory Visit (INDEPENDENT_AMBULATORY_CARE_PROVIDER_SITE_OTHER): Payer: 59 | Admitting: Allergy and Immunology

## 2016-01-27 ENCOUNTER — Encounter: Payer: Self-pay | Admitting: Allergy and Immunology

## 2016-01-27 VITALS — BP 120/86 | HR 100 | Resp 16

## 2016-01-27 DIAGNOSIS — J309 Allergic rhinitis, unspecified: Secondary | ICD-10-CM | POA: Diagnosis not present

## 2016-01-27 DIAGNOSIS — J4520 Mild intermittent asthma, uncomplicated: Secondary | ICD-10-CM

## 2016-01-27 DIAGNOSIS — K219 Gastro-esophageal reflux disease without esophagitis: Secondary | ICD-10-CM | POA: Diagnosis not present

## 2016-01-27 DIAGNOSIS — H6980 Other specified disorders of Eustachian tube, unspecified ear: Secondary | ICD-10-CM

## 2016-01-27 DIAGNOSIS — H1010 Acute atopic conjunctivitis, unspecified eye: Secondary | ICD-10-CM | POA: Diagnosis not present

## 2016-01-27 DIAGNOSIS — G4730 Sleep apnea, unspecified: Secondary | ICD-10-CM | POA: Diagnosis not present

## 2016-01-27 NOTE — Progress Notes (Signed)
Follow-up Note  Referring Provider: Myrlene Broker, MD Primary Provider: Myrlene Broker, MD Date of Office Visit: 01/27/2016  Subjective:   [name] (DOB: 1970-08-15) is a 45 y.o. male who returns to the [facility] on 01/27/2016 in re-evaluation of the following:  HPI: [name] presents this clinic in reevaluation of his asthma and allergic rhinoconjunctivitis and reflux and sleep apnea. I've not seen him in his clinic since March 2017.  His asthma and upper airway disease appears to be doing relatively well at this point in time on his current medical therapy. While using a large collection of treatment he has not had any exacerbations of his upper airway disease requiring him to get an antibiotic nor has he required a systemic steroid to treat asthma. He rarely uses a short acting bronchodilator. His ETD is being evaluated by his ear nose and throat physician. His reflux is been under excellent control on his current medical therapy. He is here today to get skin tested in anticipation of starting a course immunotherapy.    Medication List           acetaminophen 500 MG tablet  Commonly known as:  TYLENOL  Take 500 mg by mouth 3 (three) times daily.     albuterol 108 (90 Base) MCG/ACT inhaler  Commonly known as:  PROVENTIL HFA;VENTOLIN HFA  Inhale 2 puffs into the lungs every 4 (four) hours as needed for wheezing or shortness of breath.     albuterol 108 (90 Base) MCG/ACT inhaler  Commonly known as:  VENTOLIN HFA  Inhale 2 puffs into the lungs every 4 (four) hours as needed for wheezing or shortness of breath.     cyanocobalamin 1000 MCG/ML injection  Commonly known as:  (VITAMIN B-12)     DEPO-TESTOSTERONE 200 MG/ML injection  Generic drug:  testosterone cypionate     diazepam 5 MG tablet  Commonly known as:  VALIUM  Take 5 mg by mouth 2 (two) times daily as needed for muscle spasms.     diclofenac 75 MG EC tablet  Commonly known as:   VOLTAREN  Take 1 tablet (75 mg total) by mouth 2 (two) times daily.     FLUoxetine 20 MG capsule  Commonly known as:  PROZAC  Take 20 mg by mouth at bedtime. Give with the 40 mg to equal the 60 mg dose     FLUoxetine 40 MG capsule  Commonly known as:  PROZAC  Take 40 mg by mouth at bedtime. Give with the 20 mg to equal 60 mg     guaiFENesin 600 MG 12 hr tablet  Commonly known as:  MUCINEX  Take 600 mg by mouth 2 (two) times daily as needed.     ibuprofen 600 MG tablet  Commonly known as:  ADVIL,MOTRIN  Take 600 mg by mouth every 4 (four) hours. Reported on 01/27/2016     lisinopril 20 MG tablet  Commonly known as:  PRINIVIL,ZESTRIL  Take 20 mg by mouth at bedtime.     meloxicam 15 MG tablet  Commonly known as:  MOBIC  Take 1 tablet (15 mg total) by mouth daily.     methocarbamol 750 MG tablet  Commonly known as:  ROBAXIN  Take 750 mg by mouth every 8 (eight) hours as needed for muscle spasms. Reported on 01/27/2016     methylPREDNISolone 4 MG Tbpk tablet  Commonly known as:  MEDROL DOSEPAK  Take as instructed     montelukast  10 MG tablet  Commonly known as:  SINGULAIR  Take 1 tablet (10 mg total) by mouth at bedtime.     omeprazole 20 MG capsule  Commonly known as:  PRILOSEC  Take 20 mg by mouth daily.     omeprazole 40 MG capsule  Commonly known as:  PRILOSEC  Take 1 capsule (40 mg total) by mouth daily.     ondansetron 4 MG disintegrating tablet  Commonly known as:  ZOFRAN ODT  4mg  ODT q4 hours prn nausea/vomit     Oxycodone HCl 20 MG Tabs  Take 20 mg by mouth every 6 (six) hours as needed (pain). Reported on 01/27/2016     oxyCODONE 15 MG immediate release tablet  Commonly known as:  ROXICODONE     Oxycodone HCl 10 MG Tabs     phenylephrine 10 MG Tabs tablet  Commonly known as:  SUDAFED PE  Take 10 mg by mouth daily.     ranitidine 75 MG tablet  Commonly known as:  ZANTAC  Take 75 mg by mouth daily. Reported on 01/27/2016     ranitidine 300 MG tablet    Commonly known as:  ZANTAC  Take 1 tablet (300 mg total) by mouth at bedtime.     Verapamil HCl CR 300 MG Cp24  Take 300 mg by mouth at bedtime.        Past Medical History  Diagnosis Date  . Hyperlipidemia   . Hypertension     Dr. Allayne Butcher, [facility]  . Arthritis     back area  . Pancreatitis     hx of  . Depression     on prozac  . Sleep apnea     sleep study approx 18 months ago, uses cpap  . Chronic back pain   . Pneumonia     hx of  2005ish  . Anxiety   . Thrombocytopenia, idiopathic (HCC)     age of 56 months  . Complication of anesthesia     noticed some memory loss after surgery 07/18/12  . GERD (gastroesophageal reflux disease)     uses baking soda  . Blood dyscrasia     thrombocytopenia  15 months  . Dysrhythmia     PAF in 2012, felt related to OSA--now on CPAP; PRN follow-up Dr. Lin Givens 03/2011    Past Surgical History  Procedure Laterality Date  . Cholecystectomy    . Hernia repair Left     inguinal  . Back surgery      x2 fusions, L5-S1, L4-L5  . Tonsillectomy    . Appendectomy      77 months of age  . Vasectomy    . Refractive surgery Bilateral 98  . Spinal cord stimulator implant  07/18/2012  . Spinal cord stimulator insertion  07/18/2012    Procedure: LUMBAR SPINAL CORD STIMULATOR INSERTION;  Surgeon: Melina Schools, MD;  Location: [place];  Service: Orthopedics;  Laterality: N/A;  trial spinal cord stimulator implant   . Spinal cord stimulator removal  07/26/2012    Procedure: LUMBAR SPINAL CORD STIMULATOR REMOVAL;  Surgeon: Melina Schools, MD;  Location: [place];  Service: Orthopedics;  Laterality: N/A;  REMOVAL OF TRIAL LEAD OR CONVERSION TO PERM SPINAL CORD STIMULATOR IMPLANT  . Sacroiliac joint fusion Right 11/21/2013    Procedure: RIGHT SACROILIAC JOINT FUSION;  Surgeon: Sinclair Ship, MD;  Location: [place];  Service: Orthopedics;  Laterality: Right;  Right sided sacroiliac joint fusion  . Diagnostic  laparoscopy      exploratory lab, abdominal  . Sacroiliac joint fusion Left 01/22/2014    Procedure: SACROILIAC JOINT FUSION;  Surgeon: Sinclair Ship, MD;  Location: [place];  Service: Orthopedics;  Laterality: Left;  Left sided sacroiliac joint fusion    Allergies  Allergen Reactions  . Nexium [Esomeprazole Magnesium] Hives and Swelling  . Wellbutrin [Bupropion] Hives and Swelling    Review of systems negative except as noted in HPI / PMHx or noted below:  Review of Systems  Constitutional: Negative.   HENT: Negative.   Eyes: Negative.   Respiratory: Negative.   Cardiovascular: Negative.   Gastrointestinal: Negative.   Genitourinary: Negative.   Musculoskeletal: Negative.        Has been having lots of problems with plantar fasciitis requiring several physical interventions  Skin: Negative.   Neurological: Negative.   Endo/Heme/Allergies: Negative.   Psychiatric/Behavioral: Negative.      Objective:   Filed Vitals:   01/27/16 1513  BP: 120/86  Pulse: 100  Resp: 16          Physical Exam  Constitutional: He is well-developed, well-nourished, and in no distress.  HENT:  Head: Normocephalic.  Right Ear: Tympanic membrane, external ear and ear canal normal.  Left Ear: Tympanic membrane, external ear and ear canal normal.  Nose: Nose normal. No mucosal edema or rhinorrhea.  Mouth/Throat: Uvula is midline, oropharynx is clear and moist and mucous membranes are normal. No oropharyngeal exudate.  Eyes: Conjunctivae are normal.  Neck: Trachea normal. No tracheal tenderness present. No tracheal deviation present. No thyromegaly present.  Cardiovascular: Normal rate, regular rhythm, S1 normal, S2 normal and normal heart sounds.   No murmur heard. Pulmonary/Chest: Breath sounds normal. No stridor. No respiratory distress. He has no wheezes. He has no rales.  Musculoskeletal: He exhibits no edema.  Lymphadenopathy:       Head (right side): No tonsillar adenopathy  present.       Head (left side): No tonsillar adenopathy present.    He has no cervical adenopathy.  Neurological: He is alert. Gait normal.  Skin: No rash noted. He is not diaphoretic. No erythema. Nails show no clubbing.  Psychiatric: Mood and affect normal.    Diagnostics: Allergy skin testing was performed. He demonstrated hypersensitivity to cat, dog, dust mite, and mold      Assessment and Plan:   1. Allergic rhinoconjunctivitis   2. ETD (eustachian tube dysfunction), unspecified laterality   3. Asthma, mild intermittent, well-controlled   4. Gastroesophageal reflux disease, esophagitis presence not specified   5. Sleep apnea     1. Allergen avoidance measures   2. Start a course of immunotherapy  3. Treat inflammation:   A. OTC Rhinocort one spray each nostril one time per day. Coupon.  B. montelukast 10 mg one tablet one time per day  4. Treat reflux:   A. consolidate all caffeine consumption. Aim for none. Taper slowly.  B. omeprazole 40 mg in a.m.  C. ranitidine 300 mg in PM  5. If needed:   A. Ventolin HFA 2 puffs every 4-6 hours  B. OTC antihistamine - Claritin/Allegra/Zyrtec   6. Buffer CPAP solution: 1/4 teaspoon salt + 1/4 teaspoon baking soda in 8 ounces distilled water  7. Obtain fall flu vaccine  8. Return to clinic in 3 months or earlier if problem  [name] will be starting a course of immunotherapy directed against various aeroallergens that are exacerbating his atopic respiratory disease and he'll also continued to use  therapy as mentioned above including the use of Anti-inflammatory agents for his upper respiratory tract and consistent use of a leukotriene modifier. His reflux appears to be doing relatively well and were not going to have him consolidate his therapy for this treatment at this point. I will see him back in this clinic in approximately 3 months or earlier if there is a problem.  Allena Katz, MD [place]

## 2016-01-27 NOTE — Patient Instructions (Signed)
  1. Allergen avoidance measures   2. Start a course of immunotherapy  3. Treat inflammation:   A. OTC Rhinocort one spray each nostril one time per day. Coupon.  B. montelukast 10 mg one tablet one time per day  4. Treat reflux:   A. consolidate all caffeine consumption. Aim for none. Taper slowly.  B. omeprazole 40 mg in a.m.  C. ranitidine 300 mg in PM  5. If needed:   A. Ventolin HFA 2 puffs every 4-6 hours  B. OTC antihistamine - Claritin/Allegra/Zyrtec   6. Buffer CPAP solution: 1/4 teaspoon salt + 1/4 teaspoon baking soda in 8 ounces distilled water  7. Obtain fall flu vaccine  8. Return to clinic in 3 months or earlier if problem

## 2016-01-28 DIAGNOSIS — F4322 Adjustment disorder with anxiety: Secondary | ICD-10-CM | POA: Diagnosis not present

## 2016-01-28 DIAGNOSIS — M961 Postlaminectomy syndrome, not elsewhere classified: Secondary | ICD-10-CM | POA: Diagnosis not present

## 2016-01-28 DIAGNOSIS — Z79891 Long term (current) use of opiate analgesic: Secondary | ICD-10-CM | POA: Diagnosis not present

## 2016-01-28 DIAGNOSIS — G894 Chronic pain syndrome: Secondary | ICD-10-CM | POA: Diagnosis not present

## 2016-01-28 MED FILL — oxyCODONE HCL 10 MG TABS: 10 | 30 days supply | Qty: 180 | Fill #0

## 2016-01-28 MED FILL — diazePAM 5 MG TABS: 5 | 30 days supply | Qty: 60 | Fill #0

## 2016-01-29 MED FILL — CYANOCOBALAMIN 1,000 MCG/ML: 1000 | 84 days supply | Qty: 6 | Fill #1

## 2016-02-02 DIAGNOSIS — J3089 Other allergic rhinitis: Secondary | ICD-10-CM | POA: Diagnosis not present

## 2016-02-03 DIAGNOSIS — J3081 Allergic rhinitis due to animal (cat) (dog) hair and dander: Secondary | ICD-10-CM | POA: Diagnosis not present

## 2016-02-04 ENCOUNTER — Encounter: Payer: Self-pay | Admitting: Sports Medicine

## 2016-02-04 ENCOUNTER — Ambulatory Visit (INDEPENDENT_AMBULATORY_CARE_PROVIDER_SITE_OTHER): Payer: 59 | Admitting: Sports Medicine

## 2016-02-04 ENCOUNTER — Telehealth: Payer: Self-pay | Admitting: *Deleted

## 2016-02-04 ENCOUNTER — Other Ambulatory Visit: Payer: Self-pay | Admitting: Allergy and Immunology

## 2016-02-04 DIAGNOSIS — M2142 Flat foot [pes planus] (acquired), left foot: Secondary | ICD-10-CM

## 2016-02-04 DIAGNOSIS — M2141 Flat foot [pes planus] (acquired), right foot: Secondary | ICD-10-CM

## 2016-02-04 DIAGNOSIS — M722 Plantar fascial fibromatosis: Secondary | ICD-10-CM

## 2016-02-04 DIAGNOSIS — H1010 Acute atopic conjunctivitis, unspecified eye: Secondary | ICD-10-CM

## 2016-02-04 DIAGNOSIS — M79671 Pain in right foot: Secondary | ICD-10-CM

## 2016-02-04 DIAGNOSIS — M2170 Unequal limb length (acquired), unspecified site: Secondary | ICD-10-CM | POA: Diagnosis not present

## 2016-02-04 DIAGNOSIS — J309 Allergic rhinitis, unspecified: Principal | ICD-10-CM

## 2016-02-04 MED FILL — VERAPAMIL ER PM 300 MG CAP: 300 | 30 days supply | Qty: 30 | Fill #5

## 2016-02-04 MED FILL — MONTELUKAST SOD 10 MG TAB: 10 | 30 days supply | Qty: 30 | Fill #3

## 2016-02-04 NOTE — Progress Notes (Signed)
Patient ID: [name], male   DOB: 1971-01-15, 45 y.o.   MRN: [id number]  Subjective: [name] is a 45 y.o. male returns to office again for evaluation reports that he is having increased pain that is pinpoint to his right heel. Has had 3 shockwave treatments with the last treatment being 3 weeks ago, reports that he tolerates the treatment well however he still is getting a lot of pain pinpoint to the heel on the right a few days after treatment. Reports that he has used his son's sock to cushion the heel which helped a little. States that he has taken tylenol but has not used heat, ice, or other anti-inflammatories. Denies any other pedal complaints  Patient Active Problem List   Diagnosis Date Noted  . Abdominal pain, left lower quadrant 06/21/2013   Current Outpatient Prescriptions on File Prior to Visit  Medication Sig Dispense Refill  . acetaminophen (TYLENOL) 500 MG tablet Take 500 mg by mouth 3 (three) times daily.    Marland Kitchen albuterol (VENTOLIN HFA) 108 (90 Base) MCG/ACT inhaler Inhale 2 puffs into the lungs every 4 (four) hours as needed for wheezing or shortness of breath. 1 Inhaler 5  . cyanocobalamin (,VITAMIN B-12,) 1000 MCG/ML injection   6  . DEPO-TESTOSTERONE 200 MG/ML injection   2  . diazepam (VALIUM) 5 MG tablet Take 5 mg by mouth 2 (two) times daily as needed for muscle spasms.     . diclofenac (VOLTAREN) 75 MG EC tablet Take 1 tablet (75 mg total) by mouth 2 (two) times daily. 30 tablet 0  . FLUoxetine (PROZAC) 20 MG capsule Take 20 mg by mouth at bedtime. Give with the 40 mg to equal the 60 mg dose    . FLUoxetine (PROZAC) 40 MG capsule Take 40 mg by mouth at bedtime. Give with the 20 mg to equal 60 mg    . guaiFENesin (MUCINEX) 600 MG 12 hr tablet Take 600 mg by mouth 2 (two) times daily as needed.    Marland Kitchen ibuprofen (ADVIL,MOTRIN) 600 MG tablet Take 600 mg by mouth every 4 (four) hours. Reported on 01/27/2016    . lisinopril (PRINIVIL,ZESTRIL) 20 MG tablet Take 20 mg  by mouth at bedtime.    . montelukast (SINGULAIR) 10 MG tablet Take 1 tablet (10 mg total) by mouth at bedtime. 30 tablet 5  . omeprazole (PRILOSEC) 40 MG capsule Take 1 capsule (40 mg total) by mouth daily. 30 capsule 5  . ondansetron (ZOFRAN ODT) 4 MG disintegrating tablet 4mg  ODT q4 hours prn nausea/vomit (Patient not taking: Reported on 01/27/2016) 4 tablet 0  . oxyCODONE (ROXICODONE) 15 MG immediate release tablet   0  . Oxycodone HCl 10 MG TABS   0  . phenylephrine (SUDAFED PE) 10 MG TABS tablet Take 10 mg by mouth daily.    . ranitidine (ZANTAC) 300 MG tablet Take 1 tablet (300 mg total) by mouth at bedtime. 30 tablet 5  . Verapamil HCl CR 300 MG CP24 Take 300 mg by mouth at bedtime.     Current Facility-Administered Medications on File Prior to Visit  Medication Dose Route Frequency Provider Last Rate Last Dose  . triamcinolone acetonide (KENALOG) 10 MG/ML injection 10 mg  10 mg Other Once [employer], DPM      . triamcinolone acetonide (KENALOG) 10 MG/ML injection 10 mg  10 mg Other Once [employer], DPM      . triamcinolone acetonide (KENALOG) 10 MG/ML injection 10 mg  10 mg  Other Once Landis Martins, DPM       Allergies  Allergen Reactions  . Nexium [Esomeprazole Magnesium] Hives and Swelling  . Wellbutrin [Bupropion] Hives and Swelling   Objective:  No acute changes with physical exam  General:  Alert and oriented x 3, in no acute distress, obese ambulates with cane secondary to back which is fused  Dermatology: Skin is warm, dry, and supple bilateral. Nails are within normal limits. There is no lower extremity erythema, no eccymosis, no open lesions present bilateral. No signs of infection.   Vascular: Dorsalis Pedis and Posterior Tibial pedal pulses are 2/4 bilateral. + hair growth noted bilateral. Capillary Fill Time is 3 seconds in all digits. No varicosities, No edema bilateral lower extremities.   Neurological: Sensation grossly intact to light touch with an  achilles reflex of +2 and a  negative Tinel's sign bilateral. Vibratory, sharp/dull, Semmes Weinstein Monofilament within normal limits.   Musculoskeletal: There is mild pinpoint tenderness to palpation at the medial calcaneal tubercale and through the insertion of the plantar fascia on the right foot; pain is worsened when patient actually bears weight on to the heel. No pain with compression to calcaneus or application of tuning fork. Ankle and pedal joint range of motion  within normal limits bilateral. Strength 5/5 bilateral. Possible 1/4 inch right short limb secondary to spinal fusion. Pes Planus foot type bilateral.   Assessment and Plan: Problem List Items Addressed This Visit    None    Visit Diagnoses    Plantar fasciitis of right foot    -  Primary    Right foot pain        Acquired unequal limb length        Pes planus of both feet          -Patient seen and examined -Educated patient symptoms after EPAT and advised patient that pain is expected after EPAT and that it is expected to have a flare in response to the inflammation cycle that is occuring after treatment;Educated patient on need of continuation of EPAT to get the most benefit without interruption in treatment time frame.  -For symptomatic relief, injected local anesthetic at plantar fascia, right heel without complication 3 mL total of 1:1 mixture of 1% lidocaine plain and 0.5% Marcaine plain. Advised patient that this is temporary for symptomatic relief -Gave and applied heel cushion to right orthotic -Cont with EPAT; as scheduled -Re offered post op shoes to patient of which he declined; Advised patient that if pain is bothersome to consider using post op shoe or NWB option -Cont with NO ICE, NO HEAT, NO ANTI-INFLAMMATORIES  -Cont with night brace -Cont with good supportive shoes daily -Continue with plantar fascial and right hip mobility and strengthening exercises daily especially for iliopsoas and adductor  muscles.   -Patient to return to office as scheduled or sooner if problems or questions arise.  Landis Martins, DPM

## 2016-02-04 NOTE — Telephone Encounter (Signed)
The only type of injection we can do is local injection with anesthesia, NO Steroids because it may decrease the effectiveness of his shockwave treatment since we rely on the body's anti-inflammatory reaction for shockwave -Dr. Cannon Kettle

## 2016-02-04 NOTE — Telephone Encounter (Addendum)
Pt wanted to know if he could get two cortisone injections to get him to the next shockwave appt. Informed pt of Dr. Leeanne Rio statement concerning no steroids, because it decreased the effectiveness of the shockwave, and pt opted for the local anesthetic. Transferred pt to schedulers.

## 2016-02-05 ENCOUNTER — Other Ambulatory Visit: Payer: Self-pay

## 2016-02-05 MED ORDER — FLUTICASONE PROPIONATE 50 MCG/ACT NA SUSP: 1.0000 | Freq: Two times a day (BID) | NASAL | Status: DC

## 2016-02-05 MED FILL — FLUTICASONE PROP 50 MCG SPR: 50 | 30 days supply | Qty: 16 | Fill #0

## 2016-02-15 MED FILL — METHOCARBAMOL 750 MG TABLET: 750 | 30 days supply | Qty: 90 | Fill #1

## 2016-02-17 ENCOUNTER — Ambulatory Visit: Payer: 59 | Admitting: Sports Medicine

## 2016-02-18 ENCOUNTER — Ambulatory Visit (INDEPENDENT_AMBULATORY_CARE_PROVIDER_SITE_OTHER): Payer: 59 | Admitting: *Deleted

## 2016-02-18 ENCOUNTER — Ambulatory Visit: Payer: 59

## 2016-02-18 DIAGNOSIS — J309 Allergic rhinitis, unspecified: Secondary | ICD-10-CM

## 2016-02-18 MED ORDER — EPINEPHRINE 0.3 MG/0.3ML IJ SOAJ: INTRAMUSCULAR | Status: DC

## 2016-02-18 NOTE — Progress Notes (Signed)
Pt started immunotherapy today. Received two injections (MOLD AND DUST MITE-CAT-DOG) Blue 1:100,000 0.05 build up 1-2 times weekly Sch B. Instructions and information reviewed including Epipen. Consent signed.

## 2016-02-19 MED FILL — raNITIdine HCL 300 MG TABS: 300 | 30 days supply | Qty: 30 | Fill #1

## 2016-02-19 MED FILL — EPINEPHRINE 0.3 MG AUTO-INJ: 0.3 | 30 days supply | Qty: 2 | Fill #0

## 2016-02-25 DIAGNOSIS — G894 Chronic pain syndrome: Secondary | ICD-10-CM | POA: Diagnosis not present

## 2016-02-25 DIAGNOSIS — M961 Postlaminectomy syndrome, not elsewhere classified: Secondary | ICD-10-CM | POA: Diagnosis not present

## 2016-02-25 DIAGNOSIS — Z79891 Long term (current) use of opiate analgesic: Secondary | ICD-10-CM | POA: Diagnosis not present

## 2016-02-25 DIAGNOSIS — F4322 Adjustment disorder with anxiety: Secondary | ICD-10-CM | POA: Diagnosis not present

## 2016-02-25 MED FILL — oxyCODONE HCL 10 MG TABS: 10 | 30 days supply | Qty: 180 | Fill #0

## 2016-02-25 MED FILL — diazePAM 5 MG TABS: 5 | 30 days supply | Qty: 60 | Fill #0 | Status: TO

## 2016-02-29 ENCOUNTER — Ambulatory Visit (INDEPENDENT_AMBULATORY_CARE_PROVIDER_SITE_OTHER): Payer: 59

## 2016-02-29 DIAGNOSIS — J309 Allergic rhinitis, unspecified: Secondary | ICD-10-CM | POA: Diagnosis not present

## 2016-02-29 MED FILL — FLUoxetine HCL 40 MG CAPS: 40 | 90 days supply | Qty: 90 | Fill #0

## 2016-03-07 ENCOUNTER — Ambulatory Visit (INDEPENDENT_AMBULATORY_CARE_PROVIDER_SITE_OTHER): Payer: 59 | Admitting: *Deleted

## 2016-03-07 DIAGNOSIS — J309 Allergic rhinitis, unspecified: Secondary | ICD-10-CM

## 2016-03-09 MED FILL — MONTELUKAST SOD 10 MG TAB: 10 | 30 days supply | Qty: 30 | Fill #4

## 2016-03-09 MED FILL — VERAPAMIL ER PM 300 MG CAP: 300 | 30 days supply | Qty: 30 | Fill #6

## 2016-03-10 DIAGNOSIS — M961 Postlaminectomy syndrome, not elsewhere classified: Secondary | ICD-10-CM | POA: Diagnosis not present

## 2016-03-10 DIAGNOSIS — F4322 Adjustment disorder with anxiety: Secondary | ICD-10-CM | POA: Diagnosis not present

## 2016-03-10 DIAGNOSIS — G894 Chronic pain syndrome: Secondary | ICD-10-CM | POA: Diagnosis not present

## 2016-03-10 DIAGNOSIS — Z79891 Long term (current) use of opiate analgesic: Secondary | ICD-10-CM | POA: Diagnosis not present

## 2016-03-10 MED FILL — OXYCODONE-APAP 10-325 TAB: 10-325 | 30 days supply | Qty: 180 | Fill #0

## 2016-03-16 MED FILL — METHOCARBAMOL 750 MG TABLET: 750 | 30 days supply | Qty: 120 | Fill #0

## 2016-03-17 ENCOUNTER — Encounter (INDEPENDENT_AMBULATORY_CARE_PROVIDER_SITE_OTHER): Payer: Self-pay

## 2016-03-17 ENCOUNTER — Ambulatory Visit: Payer: 59 | Admitting: Sports Medicine

## 2016-03-18 ENCOUNTER — Ambulatory Visit (INDEPENDENT_AMBULATORY_CARE_PROVIDER_SITE_OTHER): Payer: 59

## 2016-03-18 DIAGNOSIS — J309 Allergic rhinitis, unspecified: Secondary | ICD-10-CM | POA: Diagnosis not present

## 2016-03-23 MED FILL — raNITIdine HCL 300 MG TABS: 300 | 30 days supply | Qty: 30 | Fill #2

## 2016-03-28 MED FILL — VENTOLIN HFA 90 MCG INHALER: 108 (90 BAS | 25 days supply | Qty: 18 | Fill #1

## 2016-03-31 MED FILL — FLUoxetine HCL 20 MG CAPS: 20 | 90 days supply | Qty: 90 | Fill #1

## 2016-04-08 DIAGNOSIS — Z79891 Long term (current) use of opiate analgesic: Secondary | ICD-10-CM | POA: Diagnosis not present

## 2016-04-08 DIAGNOSIS — F4322 Adjustment disorder with anxiety: Secondary | ICD-10-CM | POA: Diagnosis not present

## 2016-04-08 DIAGNOSIS — M961 Postlaminectomy syndrome, not elsewhere classified: Secondary | ICD-10-CM | POA: Diagnosis not present

## 2016-04-08 DIAGNOSIS — G894 Chronic pain syndrome: Secondary | ICD-10-CM | POA: Diagnosis not present

## 2016-04-08 MED FILL — LISINOPRIL 20 MG TABLET: 20 | 90 days supply | Qty: 90 | Fill #5

## 2016-04-08 MED FILL — VERAPAMIL ER PM 300 MG CAP: 300 | 30 days supply | Qty: 30 | Fill #7

## 2016-04-08 MED FILL — oxyCODONE HCL 10 MG TABS: 10 | 30 days supply | Qty: 180 | Fill #0

## 2016-04-08 MED FILL — MONTELUKAST SOD 10 MG TAB: 10 | 30 days supply | Qty: 30 | Fill #5

## 2016-04-14 MED FILL — diazePAM 5 MG TABS: 5 | 30 days supply | Qty: 60 | Fill #0

## 2016-04-14 MED FILL — METHOCARBAMOL 750 MG TABLET: 750 | 30 days supply | Qty: 120 | Fill #1

## 2016-04-15 MED FILL — CYANOCOBALAMIN 1,000 MCG/ML: 1000 | 84 days supply | Qty: 6 | Fill #2

## 2016-04-25 MED FILL — raNITIdine HCL 300 MG TABS: 300 | 30 days supply | Qty: 30 | Fill #3

## 2016-04-28 ENCOUNTER — Ambulatory Visit: Payer: 59 | Admitting: Allergy and Immunology

## 2016-05-06 DIAGNOSIS — M961 Postlaminectomy syndrome, not elsewhere classified: Secondary | ICD-10-CM | POA: Diagnosis not present

## 2016-05-06 DIAGNOSIS — Z79891 Long term (current) use of opiate analgesic: Secondary | ICD-10-CM | POA: Diagnosis not present

## 2016-05-06 DIAGNOSIS — G894 Chronic pain syndrome: Secondary | ICD-10-CM | POA: Diagnosis not present

## 2016-05-06 DIAGNOSIS — F4322 Adjustment disorder with anxiety: Secondary | ICD-10-CM | POA: Diagnosis not present

## 2016-05-06 MED FILL — oxyCODONE HCL 10 MG TABS: 10 | 30 days supply | Qty: 180 | Fill #0

## 2016-05-06 MED FILL — METHOCARBAMOL 750 MG TABLET: 750 | 30 days supply | Qty: 120 | Fill #0

## 2016-05-09 ENCOUNTER — Other Ambulatory Visit: Payer: Self-pay | Admitting: Allergy and Immunology

## 2016-05-09 MED FILL — VERAPAMIL ER PM 300 MG CAP: 300 | 30 days supply | Qty: 30 | Fill #8

## 2016-05-09 MED FILL — MONTELUKAST SOD 10 MG TAB: 10 | 30 days supply | Qty: 30 | Fill #0

## 2016-05-23 MED FILL — diazePAM 5 MG TABS: 5 | 30 days supply | Qty: 60 | Fill #0

## 2016-05-26 MED FILL — raNITIdine HCL 300 MG TABS: 300 | 30 days supply | Qty: 30 | Fill #4

## 2016-05-31 MED FILL — FLUoxetine HCL 40 MG CAPS: 40 | 90 days supply | Qty: 90 | Fill #1

## 2016-06-06 MED FILL — VERAPAMIL ER PM 300 MG CAP: 300 | 30 days supply | Qty: 30 | Fill #9

## 2016-06-06 MED FILL — METHOCARBAMOL 750 MG TABLET: 750 | 30 days supply | Qty: 120 | Fill #1

## 2016-06-06 MED FILL — oxyCODONE HCL 10 MG TABS: 10 | 30 days supply | Qty: 180 | Fill #0

## 2016-06-06 MED FILL — MONTELUKAST SOD 10 MG TAB: 10 | 30 days supply | Qty: 30 | Fill #1

## 2016-06-21 MED FILL — CEFDINIR 300 MG CAPSULE: 300 | 10 days supply | Qty: 20 | Fill #0

## 2016-06-23 MED FILL — FLUoxetine HCL 20 MG CAPS: 20 | 90 days supply | Qty: 90 | Fill #0

## 2016-06-23 MED FILL — raNITIdine HCL 300 MG TABS: 300 | 30 days supply | Qty: 30 | Fill #5

## 2016-06-24 MED FILL — diazePAM 5 MG TABS: 5 | 30 days supply | Qty: 60 | Fill #0 | Status: TO

## 2016-07-05 DIAGNOSIS — M961 Postlaminectomy syndrome, not elsewhere classified: Secondary | ICD-10-CM | POA: Diagnosis not present

## 2016-07-05 DIAGNOSIS — F4322 Adjustment disorder with anxiety: Secondary | ICD-10-CM | POA: Diagnosis not present

## 2016-07-05 DIAGNOSIS — Z79891 Long term (current) use of opiate analgesic: Secondary | ICD-10-CM | POA: Diagnosis not present

## 2016-07-05 DIAGNOSIS — G894 Chronic pain syndrome: Secondary | ICD-10-CM | POA: Diagnosis not present

## 2016-07-05 MED FILL — METHOCARBAMOL 750 MG TABLET: 750 | 30 days supply | Qty: 120 | Fill #0

## 2016-07-05 MED FILL — oxyCODONE HCL 10 MG TABS: 10 | 30 days supply | Qty: 180 | Fill #0

## 2016-07-07 MED FILL — LISINOPRIL 20 MG TABLET: 20 | 90 days supply | Qty: 90 | Fill #6

## 2016-07-07 MED FILL — MONTELUKAST SOD 10 MG TAB: 10 | 30 days supply | Qty: 30 | Fill #2

## 2016-07-07 MED FILL — VERAPAMIL ER PM 300 MG CAP: 300 | 30 days supply | Qty: 30 | Fill #10

## 2016-07-11 MED FILL — CYANOCOBALAMIN 1,000 MCG/ML: 1000 | 84 days supply | Qty: 6 | Fill #3

## 2016-07-26 ENCOUNTER — Other Ambulatory Visit: Payer: Self-pay | Admitting: Allergy and Immunology

## 2016-07-26 MED FILL — raNITIdine HCL 300 MG TABS: 300 | 30 days supply | Qty: 30 | Fill #0

## 2016-08-02 MED FILL — diazePAM 5 MG TABS: 5 | 30 days supply | Qty: 60 | Fill #0

## 2016-08-04 MED FILL — VERAPAMIL ER PM 300 MG CAP: 300 | 30 days supply | Qty: 30 | Fill #11

## 2016-08-04 MED FILL — oxyCODONE HCL 10 MG TABS: 10 | 30 days supply | Qty: 180 | Fill #0

## 2016-08-04 MED FILL — METHOCARBAMOL 750 MG TABLET: 750 | 30 days supply | Qty: 120 | Fill #1

## 2016-08-09 MED FILL — MONTELUKAST SOD 10 MG TAB: 10 | 90 days supply | Qty: 90 | Fill #0

## 2016-08-12 DIAGNOSIS — H52223 Regular astigmatism, bilateral: Secondary | ICD-10-CM | POA: Diagnosis not present

## 2016-08-30 DIAGNOSIS — G894 Chronic pain syndrome: Secondary | ICD-10-CM | POA: Diagnosis not present

## 2016-08-30 DIAGNOSIS — M961 Postlaminectomy syndrome, not elsewhere classified: Secondary | ICD-10-CM | POA: Diagnosis not present

## 2016-08-30 DIAGNOSIS — Z79891 Long term (current) use of opiate analgesic: Secondary | ICD-10-CM | POA: Diagnosis not present

## 2016-08-30 DIAGNOSIS — F4322 Adjustment disorder with anxiety: Secondary | ICD-10-CM | POA: Diagnosis not present

## 2016-08-30 MED FILL — METHOCARBAMOL 750 MG TABLET: 750 | 30 days supply | Qty: 120 | Fill #2

## 2016-08-30 MED FILL — VERAPAMIL ER PM 300 MG CAP: 300 | 30 days supply | Qty: 30 | Fill #0

## 2016-08-30 MED FILL — raNITIdine HCL 300 MG TABS: 300 | 30 days supply | Qty: 30 | Fill #1

## 2016-08-30 MED FILL — diazePAM 5 MG TABS: 5 | 30 days supply | Qty: 45 | Fill #0

## 2016-09-02 DIAGNOSIS — F4323 Adjustment disorder with mixed anxiety and depressed mood: Secondary | ICD-10-CM | POA: Diagnosis not present

## 2016-09-05 MED FILL — FLUoxetine HCL 40 MG CAPS: 40 | 90 days supply | Qty: 90 | Fill #0

## 2016-09-05 MED FILL — oxyCODONE HCL 10 MG TABS: 10 | 30 days supply | Qty: 180 | Fill #0

## 2016-09-14 DIAGNOSIS — F4323 Adjustment disorder with mixed anxiety and depressed mood: Secondary | ICD-10-CM | POA: Diagnosis not present

## 2016-09-27 DIAGNOSIS — F4322 Adjustment disorder with anxiety: Secondary | ICD-10-CM | POA: Diagnosis not present

## 2016-09-27 DIAGNOSIS — G894 Chronic pain syndrome: Secondary | ICD-10-CM | POA: Diagnosis not present

## 2016-09-27 DIAGNOSIS — M961 Postlaminectomy syndrome, not elsewhere classified: Secondary | ICD-10-CM | POA: Diagnosis not present

## 2016-09-27 DIAGNOSIS — Z79891 Long term (current) use of opiate analgesic: Secondary | ICD-10-CM | POA: Diagnosis not present

## 2016-09-27 MED FILL — diazePAM 5 MG TABS: 5 | 30 days supply | Qty: 45 | Fill #0

## 2016-09-27 MED FILL — CYANOCOBALAMIN 1,000 MCG/ML: 1000 | 84 days supply | Qty: 6 | Fill #4

## 2016-09-27 MED FILL — FLUoxetine HCL 20 MG CAPS: 20 | 90 days supply | Qty: 90 | Fill #1

## 2016-09-27 MED FILL — raNITIdine HCL 300 MG TABS: 300 | 30 days supply | Qty: 30 | Fill #2

## 2016-09-27 MED FILL — VERAPAMIL ER PM 300 MG CAP: 300 | 30 days supply | Qty: 30 | Fill #1

## 2016-10-05 MED FILL — oxyCODONE HCL 10 MG TABS: 10 | 30 days supply | Qty: 180 | Fill #0

## 2016-10-05 MED FILL — METHOCARBAMOL 750 MG TABLET: 750 | 30 days supply | Qty: 120 | Fill #0

## 2016-10-18 DIAGNOSIS — E781 Pure hyperglyceridemia: Secondary | ICD-10-CM | POA: Diagnosis not present

## 2016-10-18 DIAGNOSIS — E8881 Metabolic syndrome: Secondary | ICD-10-CM | POA: Diagnosis not present

## 2016-10-18 DIAGNOSIS — E291 Testicular hypofunction: Secondary | ICD-10-CM | POA: Diagnosis not present

## 2016-10-19 MED FILL — LISINOPRIL 20 MG TABLET: 20 | 30 days supply | Qty: 30 | Fill #0

## 2016-10-24 DIAGNOSIS — G894 Chronic pain syndrome: Secondary | ICD-10-CM | POA: Diagnosis not present

## 2016-10-24 DIAGNOSIS — M961 Postlaminectomy syndrome, not elsewhere classified: Secondary | ICD-10-CM | POA: Diagnosis not present

## 2016-10-24 DIAGNOSIS — Z79891 Long term (current) use of opiate analgesic: Secondary | ICD-10-CM | POA: Diagnosis not present

## 2016-10-24 DIAGNOSIS — F4322 Adjustment disorder with anxiety: Secondary | ICD-10-CM | POA: Diagnosis not present

## 2016-10-28 ENCOUNTER — Other Ambulatory Visit: Payer: Self-pay | Admitting: *Deleted

## 2016-10-28 MED ORDER — RANITIDINE HCL 300 MG PO TABS: ORAL_TABLET | ORAL | 0 refills | Status: DC

## 2016-10-28 MED FILL — VERAPAMIL ER PM 300 MG CAP: 300 | 30 days supply | Qty: 30 | Fill #2

## 2016-10-28 MED FILL — raNITIdine HCL 300 MG TABS: 300 | 30 days supply | Qty: 30 | Fill #0

## 2016-10-28 MED FILL — MONTELUKAST SOD 10 MG TAB: 10 | 90 days supply | Qty: 90 | Fill #1

## 2016-11-02 MED FILL — oxyCODONE HCL 10 MG TABS: 10 | 30 days supply | Qty: 180 | Fill #0

## 2016-11-09 MED FILL — METHOCARBAMOL 750 MG TABLET: 750 | 30 days supply | Qty: 120 | Fill #1

## 2016-11-10 MED FILL — DEPO-TESTOSTERONE 200 MG/ML: 200 | 84 days supply | Qty: 6 | Fill #0

## 2016-11-14 DIAGNOSIS — F4323 Adjustment disorder with mixed anxiety and depressed mood: Secondary | ICD-10-CM | POA: Diagnosis not present

## 2016-11-29 ENCOUNTER — Other Ambulatory Visit: Payer: Self-pay | Admitting: Allergy and Immunology

## 2016-11-29 MED FILL — LISINOPRIL 20 MG TABLET: 20 | 30 days supply | Qty: 30 | Fill #1

## 2016-11-29 MED FILL — VERAPAMIL ER PM 300 MG CAP: 300 | 30 days supply | Qty: 30 | Fill #3

## 2016-11-29 MED FILL — FLUoxetine HCL 40 MG CAPS: 40 | 90 days supply | Qty: 90 | Fill #0

## 2016-12-06 MED FILL — oxyCODONE HCL 10 MG TABS: 10 | 30 days supply | Qty: 180 | Fill #0

## 2016-12-06 MED FILL — METHOCARBAMOL 750 MG TABLET: 750 | 30 days supply | Qty: 120 | Fill #2

## 2016-12-08 DIAGNOSIS — F4323 Adjustment disorder with mixed anxiety and depressed mood: Secondary | ICD-10-CM | POA: Diagnosis not present

## 2016-12-15 DIAGNOSIS — F4323 Adjustment disorder with mixed anxiety and depressed mood: Secondary | ICD-10-CM | POA: Diagnosis not present

## 2016-12-19 DIAGNOSIS — G894 Chronic pain syndrome: Secondary | ICD-10-CM | POA: Diagnosis not present

## 2016-12-19 DIAGNOSIS — M961 Postlaminectomy syndrome, not elsewhere classified: Secondary | ICD-10-CM | POA: Diagnosis not present

## 2016-12-19 DIAGNOSIS — Z79891 Long term (current) use of opiate analgesic: Secondary | ICD-10-CM | POA: Diagnosis not present

## 2016-12-19 DIAGNOSIS — F4322 Adjustment disorder with anxiety: Secondary | ICD-10-CM | POA: Diagnosis not present

## 2016-12-19 MED FILL — tiZANidine HCL 4 MG TABS: 4 | 30 days supply | Qty: 90 | Fill #0

## 2016-12-29 MED FILL — FLUoxetine HCL 20 MG CAPS: 20 | 90 days supply | Qty: 90 | Fill #0

## 2016-12-29 MED FILL — LISINOPRIL 20 MG TABLET: 20 | 30 days supply | Qty: 30 | Fill #0

## 2016-12-29 MED FILL — VERAPAMIL ER PM 300 MG CAP: 300 | 30 days supply | Qty: 30 | Fill #0

## 2016-12-30 MED FILL — METHOCARBAMOL 750 MG TABLET: 750 | 27 days supply | Qty: 109 | Fill #2

## 2017-01-04 MED FILL — CYANOCOBALAMIN 1,000 MCG/ML: 1000 | 84 days supply | Qty: 6 | Fill #0

## 2017-01-04 MED FILL — FENOFIBRATE 160 MG TABLET: 160 | 90 days supply | Qty: 90 | Fill #0

## 2017-01-06 MED FILL — oxyCODONE HCL 10 MG TABS: 10 | 30 days supply | Qty: 180 | Fill #0

## 2017-01-24 MED FILL — METHOCARBAMOL 750 MG TABLET: 750 | 30 days supply | Qty: 120 | Fill #0

## 2017-01-25 DIAGNOSIS — F4323 Adjustment disorder with mixed anxiety and depressed mood: Secondary | ICD-10-CM | POA: Diagnosis not present

## 2017-01-27 MED FILL — MONTELUKAST SOD 10 MG TAB: 10 | 90 days supply | Qty: 90 | Fill #0

## 2017-01-31 DIAGNOSIS — F331 Major depressive disorder, recurrent, moderate: Secondary | ICD-10-CM | POA: Diagnosis not present

## 2017-01-31 MED FILL — DULoxetine HCL 60 MG CPEP: 60 | 30 days supply | Qty: 30 | Fill #0

## 2017-02-02 DIAGNOSIS — M961 Postlaminectomy syndrome, not elsewhere classified: Secondary | ICD-10-CM | POA: Diagnosis not present

## 2017-02-02 DIAGNOSIS — F4322 Adjustment disorder with anxiety: Secondary | ICD-10-CM | POA: Diagnosis not present

## 2017-02-02 DIAGNOSIS — G894 Chronic pain syndrome: Secondary | ICD-10-CM | POA: Diagnosis not present

## 2017-02-02 DIAGNOSIS — Z79891 Long term (current) use of opiate analgesic: Secondary | ICD-10-CM | POA: Diagnosis not present

## 2017-02-03 MED FILL — oxyCODONE HCL 10 MG TABS: 10 | 30 days supply | Qty: 180 | Fill #0

## 2017-02-09 MED FILL — VERAPAMIL ER PM 300 MG CAP: 300 | 30 days supply | Qty: 30 | Fill #0

## 2017-02-09 MED FILL — LISINOPRIL 20 MG TABLET: 20 | 30 days supply | Qty: 30 | Fill #0

## 2017-02-10 DIAGNOSIS — F4323 Adjustment disorder with mixed anxiety and depressed mood: Secondary | ICD-10-CM | POA: Diagnosis not present

## 2017-02-13 MED FILL — TESTOSTERONE CYP 200 MG/ML: 200 | 84 days supply | Qty: 6 | Fill #1

## 2017-02-28 MED FILL — METHOCARBAMOL 750 MG TABLET: 750 | 30 days supply | Qty: 120 | Fill #1

## 2017-03-01 MED FILL — DULoxetine HCL 60 MG CPEP: 60 | 30 days supply | Qty: 30 | Fill #1

## 2017-03-06 DIAGNOSIS — M961 Postlaminectomy syndrome, not elsewhere classified: Secondary | ICD-10-CM | POA: Diagnosis not present

## 2017-03-06 DIAGNOSIS — F4322 Adjustment disorder with anxiety: Secondary | ICD-10-CM | POA: Diagnosis not present

## 2017-03-06 DIAGNOSIS — G894 Chronic pain syndrome: Secondary | ICD-10-CM | POA: Diagnosis not present

## 2017-03-06 DIAGNOSIS — Z79891 Long term (current) use of opiate analgesic: Secondary | ICD-10-CM | POA: Diagnosis not present

## 2017-03-06 MED FILL — oxyCODONE HCL 10 MG TABS: 10 | 30 days supply | Qty: 180 | Fill #0

## 2017-03-09 MED FILL — VERAPAMIL ER PM 300 MG CAP: 300 | 30 days supply | Qty: 30 | Fill #1

## 2017-03-09 MED FILL — LISINOPRIL 20 MG TAB: 20 | 30 days supply | Qty: 30 | Fill #1

## 2017-03-22 DIAGNOSIS — F4323 Adjustment disorder with mixed anxiety and depressed mood: Secondary | ICD-10-CM | POA: Diagnosis not present

## 2017-03-23 DIAGNOSIS — M549 Dorsalgia, unspecified: Secondary | ICD-10-CM

## 2017-03-23 DIAGNOSIS — E785 Hyperlipidemia, unspecified: Secondary | ICD-10-CM | POA: Insufficient documentation

## 2017-03-23 DIAGNOSIS — G4730 Sleep apnea, unspecified: Secondary | ICD-10-CM | POA: Insufficient documentation

## 2017-03-23 DIAGNOSIS — I499 Cardiac arrhythmia, unspecified: Secondary | ICD-10-CM | POA: Insufficient documentation

## 2017-03-23 DIAGNOSIS — K859 Acute pancreatitis without necrosis or infection, unspecified: Secondary | ICD-10-CM | POA: Insufficient documentation

## 2017-03-23 DIAGNOSIS — F32A Depression, unspecified: Secondary | ICD-10-CM | POA: Insufficient documentation

## 2017-03-23 DIAGNOSIS — T8859XA Other complications of anesthesia, initial encounter: Secondary | ICD-10-CM | POA: Insufficient documentation

## 2017-03-23 DIAGNOSIS — D759 Disease of blood and blood-forming organs, unspecified: Secondary | ICD-10-CM | POA: Insufficient documentation

## 2017-03-23 DIAGNOSIS — F419 Anxiety disorder, unspecified: Secondary | ICD-10-CM | POA: Insufficient documentation

## 2017-03-23 DIAGNOSIS — K219 Gastro-esophageal reflux disease without esophagitis: Secondary | ICD-10-CM | POA: Insufficient documentation

## 2017-03-23 DIAGNOSIS — G8929 Other chronic pain: Secondary | ICD-10-CM | POA: Insufficient documentation

## 2017-03-23 DIAGNOSIS — D693 Immune thrombocytopenic purpura: Secondary | ICD-10-CM | POA: Insufficient documentation

## 2017-03-23 DIAGNOSIS — M1990 Unspecified osteoarthritis, unspecified site: Secondary | ICD-10-CM | POA: Insufficient documentation

## 2017-03-23 DIAGNOSIS — F329 Major depressive disorder, single episode, unspecified: Secondary | ICD-10-CM | POA: Insufficient documentation

## 2017-03-23 DIAGNOSIS — T4145XA Adverse effect of unspecified anesthetic, initial encounter: Secondary | ICD-10-CM | POA: Insufficient documentation

## 2017-03-23 DIAGNOSIS — J189 Pneumonia, unspecified organism: Secondary | ICD-10-CM | POA: Insufficient documentation

## 2017-03-23 DIAGNOSIS — I10 Essential (primary) hypertension: Secondary | ICD-10-CM | POA: Insufficient documentation

## 2017-03-24 ENCOUNTER — Encounter: Payer: Self-pay | Admitting: Cardiology

## 2017-03-24 ENCOUNTER — Ambulatory Visit (INDEPENDENT_AMBULATORY_CARE_PROVIDER_SITE_OTHER): Payer: 59 | Admitting: Cardiology

## 2017-03-24 VITALS — BP 118/80 | HR 88 | Ht 73.0 in | Wt 253.0 lb

## 2017-03-24 DIAGNOSIS — R079 Chest pain, unspecified: Secondary | ICD-10-CM | POA: Diagnosis not present

## 2017-03-24 DIAGNOSIS — I10 Essential (primary) hypertension: Secondary | ICD-10-CM | POA: Diagnosis not present

## 2017-03-24 DIAGNOSIS — I480 Paroxysmal atrial fibrillation: Secondary | ICD-10-CM

## 2017-03-24 HISTORY — DX: Chest pain, unspecified: R07.9

## 2017-03-24 HISTORY — DX: Paroxysmal atrial fibrillation: I48.0

## 2017-03-24 MED ORDER — ASPIRIN EC 81 MG PO TBEC: 81.0000 mg | DELAYED_RELEASE_TABLET | Freq: Every day | ORAL | 3 refills | Status: DC

## 2017-03-24 NOTE — Progress Notes (Signed)
Cardiology Office Note:    Date:  03/24/2017   ID:  [name], DOB [birth year], MRN [id number]  PCP:  Mateo Flow, MD  Cardiologist:  Shirlee More, MD    Referring MD: Mateo Flow, MD    ASSESSMENT:    1. PAF (paroxysmal atrial fibrillation) (HCC)   2. Chest pain in adult   3. Chest pain, unspecified type   4. Essential hypertension    PLAN:    In order of problems listed above:  1. Further evaluation with 30 day event monitor and a decision on treatment of atrial fibrillation. B documented. 2. Further evaluation myocardial perfusion study 3. Continue current antihypertensive   Next appointment: 6 weeks   Medication Adjustments/Labs and Tests Ordered: Current medicines are reviewed at length with the patient today.  Concerns regarding medicines are outlined above.  Orders Placed This Encounter  Procedures  . Cardiac event monitor  . Myocardial Perfusion Imaging  . EKG 12-Lead   Meds ordered this encounter  Medications  . aspirin EC 81 MG tablet    Sig: Take 1 tablet (81 mg total) by mouth daily.    Dispense:  90 tablet    Refill:  3    Chief Complaint  Patient presents with  . New Patient (Initial Visit)    per pt to evaluate AFib  . Atrial Fibrillation  . Dizziness    History of Present Illness:    [name] is a 46 y.o. male with a hx of PAF in 2012  Self referred with frequent episodes of rapid HR, chest tightness and weakness several times a week lasting up to 5 hrs. He is under intense personal and marital stress. Unfortunately I have no records as it's been 6 years since last seen by me and no documentation of atrial fibrillation past. Because of his strong family history of premature CAD and sudden death he'll undergo myocardial perfusion study in the arms with a 30 day event monitor and seen back in the office is out of her therapy or referral for EP catheter ablation is documented symptomatic frequent atrial fibrillation . He will  continue aspirin and his calcium channel blocker Compliance with diet, lifestyle and medications: Yes Past Medical History:  Diagnosis Date  . Anxiety   . Arthritis    back area  . Blood dyscrasia    thrombocytopenia  15 months  . Chronic back pain   . Complication of anesthesia    noticed some memory loss after surgery 07/18/12  . Depression    on prozac  . Dysrhythmia    PAF in 2012, felt related to OSA--now on CPAP; PRN follow-up Dr. Lin Givens 03/2011  . GERD (gastroesophageal reflux disease)    uses baking soda  . Hyperlipidemia   . Hypertension    Dr. Allayne Butcher, [facility]  . Pancreatitis    hx of  . Pneumonia    hx of  2005ish  . Sleep apnea    sleep study approx 18 months ago, uses cpap  . Thrombocytopenia, idiopathic (HCC)    age of 38 months    Past Surgical History:  Procedure Laterality Date  . APPENDECTOMY     6 months of age  . BACK SURGERY     x2 fusions, L5-S1, L4-L5  . CHOLECYSTECTOMY    . DIAGNOSTIC LAPAROSCOPY     exploratory lab, abdominal  . HERNIA REPAIR Left    inguinal  . REFRACTIVE SURGERY Bilateral 98  .  SACROILIAC JOINT FUSION Right 11/21/2013   Procedure: RIGHT SACROILIAC JOINT FUSION;  Surgeon: Sinclair Ship, MD;  Location: [place];  Service: Orthopedics;  Laterality: Right;  Right sided sacroiliac joint fusion  . SACROILIAC JOINT FUSION Left 01/22/2014   Procedure: SACROILIAC JOINT FUSION;  Surgeon: Sinclair Ship, MD;  Location: [place];  Service: Orthopedics;  Laterality: Left;  Left sided sacroiliac joint fusion  . SPINAL CORD STIMULATOR IMPLANT  07/18/2012  . SPINAL CORD STIMULATOR INSERTION  07/18/2012   Procedure: LUMBAR SPINAL CORD STIMULATOR INSERTION;  Surgeon: Melina Schools, MD;  Location: [place];  Service: Orthopedics;  Laterality: N/A;  trial spinal cord stimulator implant   . SPINAL CORD STIMULATOR REMOVAL  07/26/2012   Procedure: LUMBAR SPINAL CORD STIMULATOR REMOVAL;  Surgeon: Melina Schools,  MD;  Location: [place];  Service: Orthopedics;  Laterality: N/A;  REMOVAL OF TRIAL LEAD OR CONVERSION TO PERM SPINAL CORD STIMULATOR IMPLANT  . TONSILLECTOMY    . VASECTOMY      Current Medications: Current Meds  Medication Sig  . acetaminophen (TYLENOL) 500 MG tablet Take 500 mg by mouth 3 (three) times daily.  Marland Kitchen albuterol (VENTOLIN HFA) 108 (90 Base) MCG/ACT inhaler Inhale 2 puffs into the lungs every 4 (four) hours as needed for wheezing or shortness of breath.  . cyanocobalamin (,VITAMIN B-12,) 1000 MCG/ML injection   . DEPO-TESTOSTERONE 200 MG/ML injection   . diazepam (VALIUM) 5 MG tablet Take 5 mg by mouth 2 (two) times daily as needed for muscle spasms.   . DULoxetine (CYMBALTA) 60 MG capsule Take 60 mg by mouth daily.  Marland Kitchen EPINEPHrine (EPIPEN 2-PAK) 0.3 mg/0.3 mL IJ SOAJ injection USE AS DIRECTED FOR LIFE THREATENING ALLERGIC REACTIONS  . fluticasone (FLONASE) 50 MCG/ACT nasal spray Place 1 spray into both nostrils 2 (two) times daily.  Marland Kitchen guaiFENesin (MUCINEX) 600 MG 12 hr tablet Take 600 mg by mouth 2 (two) times daily as needed.  Marland Kitchen ibuprofen (ADVIL,MOTRIN) 600 MG tablet Take 600 mg by mouth every 4 (four) hours. Reported on 01/27/2016  . lisinopril (PRINIVIL,ZESTRIL) 20 MG tablet Take 20 mg by mouth at bedtime.  . methocarbamol (ROBAXIN) 750 MG tablet Take 750 mg by mouth 3 (three) times daily as needed.  . montelukast (SINGULAIR) 10 MG tablet TAKE 1 TABLET BY MOUTH AT BEDTIME.  . Verapamil HCl CR 300 MG CP24 Take 300 mg by mouth at bedtime.   Current Facility-Administered Medications for the 03/24/17 encounter (Office Visit) with Richardo Priest, MD  Medication  . triamcinolone acetonide (KENALOG) 10 MG/ML injection 10 mg  . triamcinolone acetonide (KENALOG) 10 MG/ML injection 10 mg  . triamcinolone acetonide (KENALOG) 10 MG/ML injection 10 mg     Allergies:   Nexium [esomeprazole magnesium] and Wellbutrin [bupropion]   Social History   Social History  . Marital status:  Married    Spouse name: N/A  . Number of children: N/A  . Years of education: N/A   Social History Main Topics  . Smoking status: Current Some Day Smoker    Packs/day: 1.00    Years: 18.00    Types: Cigarettes  . Smokeless tobacco: Former Systems developer  . Alcohol use No  . Drug use: No  . Sexual activity: Yes   Other Topics Concern  . None   Social History Narrative  . None     Family History: The patient's Family history is noteworthy for CAD and sudden death in his mother and brother. ROS:   Please see the history  of present illness.    All other systems reviewed and are negative.  EKGs/Labs/Other Studies Reviewed:    The following studies were reviewed today:  EKG:  EKG ordered today.  The ekg ordered today demonstrates [place] normal.  Recent Labs: No results found for requested labs within last 8760 hours.  Recent Lipid Panel No results found for: CHOL, TRIG, HDL, CHOLHDL, VLDL, LDLCALC, LDLDIRECT  Physical Exam:    VS:  BP 118/80 (BP Location: Left Arm, Patient Position: Sitting)   Pulse 88   Ht 6\' 1"  (1.854 m)   Wt 253 lb (114.8 kg)   SpO2 96%   BMI 33.38 kg/m     Wt Readings from Last 3 Encounters:  03/24/17 253 lb (114.8 kg)  11/04/15 (!) 308 lb 10.3 oz (140 kg)  01/15/14 287 lb 4.2 oz (130.3 kg)     GEN:  Well nourished, well developed in no acute distress HEENT: Normal NECK: No JVD; No carotid bruits LYMPHATICS: No lymphadenopathy CARDIAC: RRR, no murmurs, rubs, gallops RESPIRATORY:  Clear to auscultation without rales, wheezing or rhonchi  ABDOMEN: Soft, non-tender, non-distended MUSCULOSKELETAL:  No edema; No deformity  SKIN: Warm and dry NEUROLOGIC:  Alert and oriented x 3 PSYCHIATRIC:  Normal affect    Signed, Shirlee More, MD  03/24/2017 1:11 PM    [place]

## 2017-03-24 NOTE — Patient Instructions (Addendum)
Medication Instructions:  Your physician has recommended you make the following change in your medication:  START aspirin 81 mg daily  Labwork: None  Testing/Procedures: You had an EKG today.   Your physician has recommended that you wear an event monitor. Event monitors are medical devices that record the heart's electrical activity. Doctors most often [place] these monitors to diagnose arrhythmias. Arrhythmias are problems with the speed or rhythm of the heartbeat. The monitor is a small, portable device. You can wear one while you do your normal daily activities. This is usually used to diagnose what is causing palpitations/syncope (passing out).  Your physician has requested that you have en exercise stress myoview. For further information please visit [email or web address]. Please follow instruction sheet, as given.   Follow-Up: Your physician recommends that you schedule a follow-up appointment in: 6 weeks.   Any Other Special Instructions Will Be Listed Below (If Applicable).     If you need a refill on your cardiac medications before your next appointment, please call your pharmacy.    1. Avoid all over-the-counter antihistamines except Claritin/Loratadine and Zyrtec/Cetrizine. 2. Avoid all combination including cold sinus allergies flu decongestant and sleep medications 3. You can use Robitussin DM Mucinex and Mucinex DM for cough. 4. can use Tylenol aspirin ibuprofen and naproxen but no combinations such as sleep or sinus.

## 2017-03-29 MED FILL — DULoxetine HCL 60 MG CPEP: 60 | 30 days supply | Qty: 30 | Fill #2

## 2017-03-29 MED FILL — METHOCARBAMOL 750 MG TABLET: 750 | 30 days supply | Qty: 120 | Fill #0

## 2017-04-03 ENCOUNTER — Telehealth (HOSPITAL_COMMUNITY): Payer: Self-pay | Admitting: *Deleted

## 2017-04-03 NOTE — Telephone Encounter (Signed)
Left message on voicemail per DPR in reference to upcoming appointment scheduled on 04/05/17 with detailed instructions given per Myocardial Perfusion Study Information Sheet for the test. LM to arrive 15 minutes early, and that it is imperative to arrive on time for appointment to keep from having the test rescheduled. If you need to cancel or reschedule your appointment, please call the office within 24 hours of your appointment. Failure to do so may result in a cancellation of your appointment, and a $50 no show fee. Phone number given for call back for any questions. Kirstie Peri

## 2017-04-05 ENCOUNTER — Ambulatory Visit (HOSPITAL_COMMUNITY): Payer: 59 | Attending: Cardiology

## 2017-04-05 ENCOUNTER — Ambulatory Visit (INDEPENDENT_AMBULATORY_CARE_PROVIDER_SITE_OTHER): Payer: 59

## 2017-04-05 DIAGNOSIS — I480 Paroxysmal atrial fibrillation: Secondary | ICD-10-CM | POA: Diagnosis not present

## 2017-04-05 DIAGNOSIS — R079 Chest pain, unspecified: Secondary | ICD-10-CM

## 2017-04-05 LAB — MYOCARDIAL PERFUSION IMAGING
CHL CUP NUCLEAR SDS: 3
CHL CUP NUCLEAR SRS: 2
LV dias vol: 174 mL (ref 62–150)
LV sys vol: 90 mL
Peak HR: 69 {beats}/min
RATE: 0.31
Rest HR: 67 {beats}/min
SSS: 5
TID: 1.11

## 2017-04-05 MED ORDER — REGADENOSON 0.4 MG/5ML IV SOLN
0.4000 mg | Freq: Once | INTRAVENOUS | Status: AC
  Administered 2017-04-05: 0.4 mg via INTRAVENOUS

## 2017-04-05 MED ORDER — TECHNETIUM TC 99M TETROFOSMIN IV KIT
32.4000 | PACK | Freq: Once | INTRAVENOUS | Status: AC | PRN
  Administered 2017-04-05: 32.4 via INTRAVENOUS
  Filled 2017-04-05: qty 33

## 2017-04-05 MED ORDER — TECHNETIUM TC 99M TETROFOSMIN IV KIT
10.7000 | PACK | Freq: Once | INTRAVENOUS | Status: AC | PRN
  Administered 2017-04-05: 10.7 via INTRAVENOUS
  Filled 2017-04-05: qty 11

## 2017-04-05 MED FILL — LISINOPRIL 20 MG TABS: 20 | 30 days supply | Qty: 30 | Fill #2

## 2017-04-05 MED FILL — VERAPAMIL ER PM 300 MG CAP: 300 | 30 days supply | Qty: 30 | Fill #2

## 2017-04-05 MED FILL — FENOFIBRATE 160 MG TABLET: 160 | 90 days supply | Qty: 90 | Fill #1

## 2017-04-11 MED FILL — oxyCODONE HCL 10 MG TABS: 10 | 30 days supply | Qty: 180 | Fill #0

## 2017-04-19 MED FILL — CYANOCOBALAMIN 1,000 MCG/ML: 1000 | 84 days supply | Qty: 6 | Fill #1

## 2017-05-04 MED FILL — DULoxetine HCL 60 MG CPEP: 60 | 30 days supply | Qty: 30 | Fill #3

## 2017-05-04 NOTE — Progress Notes (Deleted)
Cardiology Office Note:    Date:  05/04/2017   ID:  [name], DOB 12/24/1970, MRN [id number]  PCP:  Mateo Flow, MD  Cardiologist:  Shirlee More, MD    Referring MD: Mateo Flow, MD    ASSESSMENT:    No diagnosis found. PLAN:    In order of problems listed above:  1. ***   Next appointment: ***   Medication Adjustments/Labs and Tests Ordered: Current medicines are reviewed at length with the patient today.  Concerns regarding medicines are outlined above.  No orders of the defined types were placed in this encounter.  No orders of the defined types were placed in this encounter.   No chief complaint on file.   History of Present Illness:    [name] is a 46 y.o. male with a hx of PAF last seen 03/24/17. Compliance with diet, lifestyle and medications: *** Past Medical History:  Diagnosis Date  . Anxiety   . Arthritis    back area  . Blood dyscrasia    thrombocytopenia  15 months  . Chronic back pain   . Complication of anesthesia    noticed some memory loss after surgery 07/18/12  . Depression    on prozac  . Dysrhythmia    PAF in 2012, felt related to OSA--now on CPAP; PRN follow-up Dr. Lin Givens 03/2011  . GERD (gastroesophageal reflux disease)    uses baking soda  . Hyperlipidemia   . Hypertension    Dr. Allayne Butcher, [facility] family physicians  . Pancreatitis    hx of  . Pneumonia    hx of  2005ish  . Sleep apnea    sleep study approx 18 months ago, uses cpap  . Thrombocytopenia, idiopathic (HCC)    age of 13 months    Past Surgical History:  Procedure Laterality Date  . APPENDECTOMY     42 months of age  . BACK SURGERY     x2 fusions, L5-S1, L4-L5  . CHOLECYSTECTOMY    . DIAGNOSTIC LAPAROSCOPY     exploratory lab, abdominal  . HERNIA REPAIR Left    inguinal  . REFRACTIVE SURGERY Bilateral 98  . SACROILIAC JOINT FUSION Right 11/21/2013   Procedure: RIGHT SACROILIAC JOINT FUSION;  Surgeon: Sinclair Ship, MD;  Location: [place];  Service: Orthopedics;  Laterality: Right;  Right sided sacroiliac joint fusion  . SACROILIAC JOINT FUSION Left 01/22/2014   Procedure: SACROILIAC JOINT FUSION;  Surgeon: Sinclair Ship, MD;  Location: [place];  Service: Orthopedics;  Laterality: Left;  Left sided sacroiliac joint fusion  . SPINAL CORD STIMULATOR IMPLANT  07/18/2012  . SPINAL CORD STIMULATOR INSERTION  07/18/2012   Procedure: LUMBAR SPINAL CORD STIMULATOR INSERTION;  Surgeon: Melina Schools, MD;  Location: [place];  Service: Orthopedics;  Laterality: N/A;  trial spinal cord stimulator implant   . SPINAL CORD STIMULATOR REMOVAL  07/26/2012   Procedure: LUMBAR SPINAL CORD STIMULATOR REMOVAL;  Surgeon: Melina Schools, MD;  Location: [place];  Service: Orthopedics;  Laterality: N/A;  REMOVAL OF TRIAL LEAD OR CONVERSION TO PERM SPINAL CORD STIMULATOR IMPLANT  . TONSILLECTOMY    . VASECTOMY      Current Medications: No outpatient prescriptions have been marked as taking for the 05/05/17 encounter (Appointment) with Richardo Priest, MD.   Current Facility-Administered Medications for the 05/05/17 encounter (Appointment) with Richardo Priest, MD  Medication  . triamcinolone acetonide (KENALOG) 10 MG/ML injection 10 mg  . triamcinolone acetonide (  KENALOG) 10 MG/ML injection 10 mg  . triamcinolone acetonide (KENALOG) 10 MG/ML injection 10 mg     Allergies:   Nexium [esomeprazole magnesium] and Wellbutrin [bupropion]   Social History   Social History  . Marital status: Married    Spouse name: N/A  . Number of children: N/A  . Years of education: N/A   Social History Main Topics  . Smoking status: Current Some Day Smoker    Packs/day: 1.00    Years: 18.00    Types: Cigarettes  . Smokeless tobacco: Former Systems developer  . Alcohol use No  . Drug use: No  . Sexual activity: Yes   Other Topics Concern  . Not on file   Social History Narrative  . No narrative on file     Family History: The  patient's ***family history includes Heart attack in his brother and mother. ROS:   Please see the history of present illness.    All other systems reviewed and are negative.  EKGs/Labs/Other Studies Reviewed:    The following studies were reviewed today:  EKG:  EKG ordered today.  The ekg ordered today demonstrates *** Pharmacologic MPI, Study Impression Myocardial perfusion is normal. The study is normal. This is a low risk study. Overall left ventricular systolic function was abnormal. LV cavity size is mildly enlarged. Nuclear stress EF: 48%. The left ventricular ejection fraction is mildly decreased (45-54%).     Recent Labs: No results found for requested labs within last 8760 hours.  Recent Lipid Panel No results found for: CHOL, TRIG, HDL, CHOLHDL, VLDL, LDLCALC, LDLDIRECT  Physical Exam:    VS:  There were no vitals taken for this visit.    Wt Readings from Last 3 Encounters:  04/05/17 253 lb (114.8 kg)  03/24/17 253 lb (114.8 kg)  11/04/15 (!) 308 lb 10.3 oz (140 kg)     GEN: *** Well nourished, well developed in no acute distress HEENT: Normal NECK: No JVD; No carotid bruits LYMPHATICS: No lymphadenopathy CARDIAC: ***RRR, no murmurs, rubs, gallops RESPIRATORY:  Clear to auscultation without rales, wheezing or rhonchi  ABDOMEN: Soft, non-tender, non-distended MUSCULOSKELETAL:  No edema; No deformity  SKIN: Warm and dry NEUROLOGIC:  Alert and oriented x 3 PSYCHIATRIC:  Normal affect    Signed, Shirlee More, MD  05/04/2017 8:49 AM    [facility]

## 2017-05-05 ENCOUNTER — Ambulatory Visit: Payer: 59 | Admitting: Cardiology

## 2017-05-08 DIAGNOSIS — G894 Chronic pain syndrome: Secondary | ICD-10-CM | POA: Diagnosis not present

## 2017-05-08 DIAGNOSIS — M961 Postlaminectomy syndrome, not elsewhere classified: Secondary | ICD-10-CM | POA: Diagnosis not present

## 2017-05-08 DIAGNOSIS — F4323 Adjustment disorder with mixed anxiety and depressed mood: Secondary | ICD-10-CM | POA: Diagnosis not present

## 2017-05-08 DIAGNOSIS — F4322 Adjustment disorder with anxiety: Secondary | ICD-10-CM | POA: Diagnosis not present

## 2017-05-08 DIAGNOSIS — Z79891 Long term (current) use of opiate analgesic: Secondary | ICD-10-CM | POA: Diagnosis not present

## 2017-05-09 MED FILL — METHOCARBAMOL 750 MG TABLET: 750 | 30 days supply | Qty: 120 | Fill #1

## 2017-05-09 MED FILL — oxyCODONE HCL 10 MG TABS: 10 | 30 days supply | Qty: 180 | Fill #0

## 2017-05-10 MED FILL — VERAPAMIL ER PM 300 MG CAP: 300 | 30 days supply | Qty: 30 | Fill #3

## 2017-05-10 MED FILL — MONTELUKAST SOD 10 MG TAB: 10 | 90 days supply | Qty: 90 | Fill #0

## 2017-05-10 MED FILL — LISINOPRIL 20 MG TABS: 20 | 30 days supply | Qty: 30 | Fill #3

## 2017-05-19 ENCOUNTER — Telehealth: Payer: Self-pay

## 2017-05-19 NOTE — Telephone Encounter (Signed)
Contacted patient to schedule appointment. Patient states that he is still not in front of his calendar and will try to remember to call next week to schedule.

## 2017-05-19 NOTE — Telephone Encounter (Signed)
Contacted patient about results. Advised that Dr. Bettina Gavia would like to see the patient in an office follow up. Patient states he would return call when he looks at his calendar to schedule.

## 2017-06-01 NOTE — Telephone Encounter (Signed)
Patient is starting a new job next week and would like to wait and see his work schedule and call back to schedule.

## 2017-06-02 MED FILL — DULoxetine HCL 60 MG CPEP: 60 | 30 days supply | Qty: 30 | Fill #4

## 2017-06-09 MED FILL — METHOCARBAMOL 750 MG TABS: 750 | 30 days supply | Qty: 120 | Fill #2

## 2017-06-09 MED FILL — oxyCODONE HCL 10 MG TABS: 10 | 30 days supply | Qty: 180 | Fill #0

## 2017-06-15 MED FILL — VERAPAMIL ER PM 300 MG CAP: 300 | 30 days supply | Qty: 30 | Fill #0

## 2017-06-15 MED FILL — LISINOPRIL 20 MG TABS: 20 | 30 days supply | Qty: 30 | Fill #0

## 2017-06-22 NOTE — Telephone Encounter (Signed)
Appointment made for 06/27/17 at 10:40 am. Patient verbalized understanding of appointment date and time.

## 2017-06-26 NOTE — Progress Notes (Deleted)
Cardiology Office Note:    Date:  06/26/2017   ID:  [name], DOB 09-09-1970, MRN [id number]  PCP:  Mateo Flow, MD  Cardiologist:  Shirlee More, MD    Referring MD: Mateo Flow, MD    ASSESSMENT:    No diagnosis found. PLAN:    In order of problems listed above:  1. ***   Next appointment: ***   Medication Adjustments/Labs and Tests Ordered: Current medicines are reviewed at length with the patient today.  Concerns regarding medicines are outlined above.  No orders of the defined types were placed in this encounter.  No orders of the defined types were placed in this encounter.   No chief complaint on file.   History of Present Illness:    [name] is a 46 y.o. male with a hx of  PAF in 2012  Self referred with frequent episodes of rapid HR, chest tightness and weakness several times a week lasting up to 5 hrs last seen ***. Compliance with diet, lifestyle and medications: *** Past Medical History:  Diagnosis Date  . Anxiety   . Arthritis    back area  . Blood dyscrasia    thrombocytopenia  15 months  . Chronic back pain   . Complication of anesthesia    noticed some memory loss after surgery 07/18/12  . Depression    on prozac  . Dysrhythmia    PAF in 2012, felt related to OSA--now on CPAP; PRN follow-up Dr. Lin Givens 03/2011  . GERD (gastroesophageal reflux disease)    uses baking soda  . Hyperlipidemia   . Hypertension    Dr. Allayne Butcher, [facility] family physicians  . Pancreatitis    hx of  . Pneumonia    hx of  2005ish  . Sleep apnea    sleep study approx 18 months ago, uses cpap  . Thrombocytopenia, idiopathic (HCC)    age of 67 months    Past Surgical History:  Procedure Laterality Date  . APPENDECTOMY     15 months of age  . BACK SURGERY     x2 fusions, L5-S1, L4-L5  . CHOLECYSTECTOMY    . DIAGNOSTIC LAPAROSCOPY     exploratory lab, abdominal  . HERNIA REPAIR Left    inguinal  . LUMBAR SPINAL CORD  STIMULATOR INSERTION N/A 07/18/2012   Performed by Melina Schools, MD at [place] N/A 07/26/2012   Performed by Melina Schools, MD at [place] Bilateral 98  . RIGHT SACROILIAC JOINT FUSION Right 11/21/2013   Performed by Sinclair Ship, MD at [place]  . SACROILIAC JOINT FUSION Left 01/22/2014   Performed by Sinclair Ship, MD at [place]  . SPINAL CORD STIMULATOR IMPLANT  07/18/2012  . TONSILLECTOMY    . VASECTOMY      Current Medications: No outpatient medications have been marked as taking for the 06/27/17 encounter (Appointment) with Richardo Priest, MD.   Current Facility-Administered Medications for the 06/27/17 encounter (Appointment) with Richardo Priest, MD  Medication  . triamcinolone acetonide (KENALOG) 10 MG/ML injection 10 mg  . triamcinolone acetonide (KENALOG) 10 MG/ML injection 10 mg  . triamcinolone acetonide (KENALOG) 10 MG/ML injection 10 mg     Allergies:   Nexium [esomeprazole magnesium] and Wellbutrin [bupropion]   Social History   Socioeconomic History  . Marital status: Married    Spouse name: Not on file  . Number  of children: Not on file  . Years of education: Not on file  . Highest education level: Not on file  Social Needs  . Financial resource strain: Not on file  . Food insecurity - worry: Not on file  . Food insecurity - inability: Not on file  . Transportation needs - medical: Not on file  . Transportation needs - non-medical: Not on file  Occupational History  . Not on file  Tobacco Use  . Smoking status: Current Some Day Smoker    Packs/day: 1.00    Years: 18.00    Pack years: 18.00    Types: Cigarettes  . Smokeless tobacco: Former Network engineer and Sexual Activity  . Alcohol use: No  . Drug use: No  . Sexual activity: Yes  Other Topics Concern  . Not on file  Social History Narrative  . Not on file     Family History: The patient's ***family history  includes Heart attack in his brother and mother. ROS:   Please see the history of present illness.    All other systems reviewed and are negative.  EKGs/Labs/Other Studies Reviewed:    The following studies were reviewed today:  EKG:  EKG ordered today.  The ekg ordered today demonstrates *** MPI Overall Study Impression Myocardial perfusion is normal. The study is normal. This is a low risk study. Overall left ventricular systolic function was abnormal. LV cavity size is mildly enlarged. Nuclear stress EF: 48%. The left ventricular ejection fraction is mildly decreased (45-54%).   Holter:  Dates: 05/06/17- 05/04/17 Indication: palpitation Ordering: Dr Bettina Gavia Interpreting: Dr Bettina Gavia Baseline transmission:Sinus rhythm Atrial arrhythmia:1 symptomatic event with occasional APC's Ventricular arrhythmia: 2 symptomatic events with frequent PVC's Conduction abnormality: none Bradycardia: none Symptoms:22 episodes of palpitation, SOB, lightheaded and fatigue Conclusion: Symptomatic PVC's, although the symptomatic events were predominantly unassociated with arrhythmia : Recent Labs: No results found for requested labs within last 8760 hours.  Recent Lipid Panel No results found for: CHOL, TRIG, HDL, CHOLHDL, VLDL, LDLCALC, LDLDIRECT  Physical Exam:    VS:  There were no vitals taken for this visit.    Wt Readings from Last 3 Encounters:  04/05/17 253 lb (114.8 kg)  03/24/17 253 lb (114.8 kg)  11/04/15 (!) 308 lb 10.3 oz (140 kg)     GEN: *** Well nourished, well developed in no acute distress HEENT: Normal NECK: No JVD; No carotid bruits LYMPHATICS: No lymphadenopathy CARDIAC: ***RRR, no murmurs, rubs, gallops RESPIRATORY:  Clear to auscultation without rales, wheezing or rhonchi  ABDOMEN: Soft, non-tender, non-distended MUSCULOSKELETAL:  No edema; No deformity  SKIN: Warm and dry NEUROLOGIC:  Alert and oriented x 3 PSYCHIATRIC:  Normal affect    Signed, Shirlee More, MD   06/26/2017 4:18 PM    [facility]

## 2017-06-27 ENCOUNTER — Ambulatory Visit: Payer: 59 | Admitting: Cardiology

## 2017-07-05 MED FILL — DULoxetine HCL 60 MG CPEP: 60 | 30 days supply | Qty: 30 | Fill #5

## 2017-07-05 MED FILL — CYANOCOBALAMIN 1,000 MCG/ML: 1000 | 84 days supply | Qty: 6 | Fill #0

## 2017-07-06 DIAGNOSIS — G894 Chronic pain syndrome: Secondary | ICD-10-CM | POA: Diagnosis not present

## 2017-07-06 DIAGNOSIS — M961 Postlaminectomy syndrome, not elsewhere classified: Secondary | ICD-10-CM | POA: Diagnosis not present

## 2017-07-06 DIAGNOSIS — Z79891 Long term (current) use of opiate analgesic: Secondary | ICD-10-CM | POA: Diagnosis not present

## 2017-07-06 DIAGNOSIS — F4322 Adjustment disorder with anxiety: Secondary | ICD-10-CM | POA: Diagnosis not present

## 2017-07-07 MED FILL — oxyCODONE HCL 10 MG TABS: 10 | 30 days supply | Qty: 180 | Fill #0

## 2017-07-07 MED FILL — TESTOSTERONE CYP 200 MG/ML: 200 | 84 days supply | Qty: 6 | Fill #0

## 2017-07-10 MED FILL — METHOCARBAMOL 750 MG TABS: 750 | 30 days supply | Qty: 120 | Fill #0

## 2017-07-19 MED FILL — LISINOPRIL 20 MG TABLET: 20 | 30 days supply | Qty: 30 | Fill #1

## 2017-07-19 MED FILL — VERAPAMIL ER PM 300 MG CAP: 300 | 30 days supply | Qty: 30 | Fill #1

## 2017-07-26 MED FILL — FENOFIBRATE 160 MG TABLET: 160 | 90 days supply | Qty: 90 | Fill #0

## 2017-08-04 MED FILL — oxyCODONE HCL 10 MG TABS: 10 | 30 days supply | Qty: 180 | Fill #0

## 2017-08-07 MED FILL — DULoxetine HCL 60 MG CPEP: 60 | 30 days supply | Qty: 30 | Fill #0

## 2017-08-08 MED FILL — METHOCARBAMOL 750 MG TABS: 750 | 30 days supply | Qty: 120 | Fill #1

## 2017-08-21 MED FILL — LISINOPRIL 20 MG TABLET: 20 | 30 days supply | Qty: 30 | Fill #2

## 2017-08-21 MED FILL — VERAPAMIL ER PM 300 MG CAP: 300 | 30 days supply | Qty: 30 | Fill #2

## 2017-08-21 MED FILL — MONTELUKAST SOD 10 MG TAB: 10 | 90 days supply | Qty: 90 | Fill #1

## 2017-08-31 DIAGNOSIS — F4322 Adjustment disorder with anxiety: Secondary | ICD-10-CM | POA: Diagnosis not present

## 2017-08-31 DIAGNOSIS — M961 Postlaminectomy syndrome, not elsewhere classified: Secondary | ICD-10-CM | POA: Diagnosis not present

## 2017-08-31 DIAGNOSIS — Z79891 Long term (current) use of opiate analgesic: Secondary | ICD-10-CM | POA: Diagnosis not present

## 2017-08-31 DIAGNOSIS — G894 Chronic pain syndrome: Secondary | ICD-10-CM | POA: Diagnosis not present

## 2017-09-01 MED FILL — oxyCODONE HCL 10 MG TABS: 10 | 30 days supply | Qty: 180 | Fill #0

## 2017-09-03 MED FILL — tiZANidine HCL 4 MG TABS: 4 | 30 days supply | Qty: 90 | Fill #1

## 2017-09-08 DIAGNOSIS — E781 Pure hyperglyceridemia: Secondary | ICD-10-CM | POA: Diagnosis not present

## 2017-09-08 DIAGNOSIS — M549 Dorsalgia, unspecified: Secondary | ICD-10-CM | POA: Diagnosis not present

## 2017-09-08 DIAGNOSIS — G8929 Other chronic pain: Secondary | ICD-10-CM | POA: Diagnosis not present

## 2017-09-08 DIAGNOSIS — Z6838 Body mass index (BMI) 38.0-38.9, adult: Secondary | ICD-10-CM | POA: Diagnosis not present

## 2017-09-08 DIAGNOSIS — F331 Major depressive disorder, recurrent, moderate: Secondary | ICD-10-CM | POA: Diagnosis not present

## 2017-09-08 DIAGNOSIS — E291 Testicular hypofunction: Secondary | ICD-10-CM | POA: Diagnosis not present

## 2017-09-08 DIAGNOSIS — I10 Essential (primary) hypertension: Secondary | ICD-10-CM | POA: Diagnosis not present

## 2017-09-08 DIAGNOSIS — K219 Gastro-esophageal reflux disease without esophagitis: Secondary | ICD-10-CM | POA: Diagnosis not present

## 2017-09-08 DIAGNOSIS — E538 Deficiency of other specified B group vitamins: Secondary | ICD-10-CM | POA: Diagnosis not present

## 2017-09-08 MED FILL — DULoxetine HCL 60 MG CPEP: 60 | 30 days supply | Qty: 30 | Fill #1

## 2017-09-08 MED FILL — OMEPRAZOLE 20 MG CAP: 20 | 90 days supply | Qty: 90 | Fill #0

## 2017-09-08 MED FILL — METHOCARBAMOL 750 MG TABS: 750 | 30 days supply | Qty: 120 | Fill #2

## 2017-09-14 DIAGNOSIS — J329 Chronic sinusitis, unspecified: Secondary | ICD-10-CM | POA: Diagnosis not present

## 2017-09-14 DIAGNOSIS — J40 Bronchitis, not specified as acute or chronic: Secondary | ICD-10-CM | POA: Diagnosis not present

## 2017-09-21 DIAGNOSIS — E781 Pure hyperglyceridemia: Secondary | ICD-10-CM | POA: Diagnosis not present

## 2017-09-21 DIAGNOSIS — E538 Deficiency of other specified B group vitamins: Secondary | ICD-10-CM | POA: Diagnosis not present

## 2017-09-21 DIAGNOSIS — E291 Testicular hypofunction: Secondary | ICD-10-CM | POA: Diagnosis not present

## 2017-09-21 DIAGNOSIS — Z79899 Other long term (current) drug therapy: Secondary | ICD-10-CM | POA: Diagnosis not present

## 2017-09-21 MED FILL — LISINOPRIL 20 MG TABLET: 20 | 30 days supply | Qty: 30 | Fill #3

## 2017-09-21 MED FILL — VERAPAMIL ER PM 300 MG CAP: 300 | 30 days supply | Qty: 30 | Fill #3

## 2017-09-25 DIAGNOSIS — J329 Chronic sinusitis, unspecified: Secondary | ICD-10-CM | POA: Diagnosis not present

## 2017-09-25 DIAGNOSIS — J3489 Other specified disorders of nose and nasal sinuses: Secondary | ICD-10-CM | POA: Diagnosis not present

## 2017-09-25 DIAGNOSIS — Z6837 Body mass index (BMI) 37.0-37.9, adult: Secondary | ICD-10-CM | POA: Diagnosis not present

## 2017-09-25 DIAGNOSIS — J40 Bronchitis, not specified as acute or chronic: Secondary | ICD-10-CM | POA: Diagnosis not present

## 2017-09-26 DIAGNOSIS — F4323 Adjustment disorder with mixed anxiety and depressed mood: Secondary | ICD-10-CM | POA: Diagnosis not present

## 2017-09-29 DIAGNOSIS — Z6837 Body mass index (BMI) 37.0-37.9, adult: Secondary | ICD-10-CM | POA: Diagnosis not present

## 2017-09-29 DIAGNOSIS — J3489 Other specified disorders of nose and nasal sinuses: Secondary | ICD-10-CM | POA: Diagnosis not present

## 2017-09-29 DIAGNOSIS — J329 Chronic sinusitis, unspecified: Secondary | ICD-10-CM | POA: Diagnosis not present

## 2017-09-29 DIAGNOSIS — J40 Bronchitis, not specified as acute or chronic: Secondary | ICD-10-CM | POA: Diagnosis not present

## 2017-10-03 MED FILL — oxyCODONE HCL 10 MG TABS: 10 | 30 days supply | Qty: 180 | Fill #0

## 2017-10-04 ENCOUNTER — Encounter (HOSPITAL_COMMUNITY): Payer: Self-pay | Admitting: Psychiatry

## 2017-10-04 ENCOUNTER — Ambulatory Visit (INDEPENDENT_AMBULATORY_CARE_PROVIDER_SITE_OTHER): Payer: 59 | Admitting: Psychiatry

## 2017-10-04 VITALS — BP 126/78 | HR 78 | Ht 73.0 in | Wt 269.8 lb

## 2017-10-04 DIAGNOSIS — R450 Nervousness: Secondary | ICD-10-CM

## 2017-10-04 DIAGNOSIS — M1990 Unspecified osteoarthritis, unspecified site: Secondary | ICD-10-CM | POA: Diagnosis not present

## 2017-10-04 DIAGNOSIS — F331 Major depressive disorder, recurrent, moderate: Secondary | ICD-10-CM

## 2017-10-04 DIAGNOSIS — G4700 Insomnia, unspecified: Secondary | ICD-10-CM

## 2017-10-04 DIAGNOSIS — F419 Anxiety disorder, unspecified: Secondary | ICD-10-CM

## 2017-10-04 DIAGNOSIS — I4891 Unspecified atrial fibrillation: Secondary | ICD-10-CM

## 2017-10-04 DIAGNOSIS — F17210 Nicotine dependence, cigarettes, uncomplicated: Secondary | ICD-10-CM | POA: Diagnosis not present

## 2017-10-04 DIAGNOSIS — F4310 Post-traumatic stress disorder, unspecified: Secondary | ICD-10-CM

## 2017-10-04 DIAGNOSIS — Z736 Limitation of activities due to disability: Secondary | ICD-10-CM

## 2017-10-04 DIAGNOSIS — Z630 Problems in relationship with spouse or partner: Secondary | ICD-10-CM | POA: Diagnosis not present

## 2017-10-04 DIAGNOSIS — M549 Dorsalgia, unspecified: Secondary | ICD-10-CM | POA: Diagnosis not present

## 2017-10-04 DIAGNOSIS — F4010 Social phobia, unspecified: Secondary | ICD-10-CM

## 2017-10-04 DIAGNOSIS — I10 Essential (primary) hypertension: Secondary | ICD-10-CM | POA: Diagnosis not present

## 2017-10-04 DIAGNOSIS — K219 Gastro-esophageal reflux disease without esophagitis: Secondary | ICD-10-CM

## 2017-10-04 MED ORDER — VORTIOXETINE HBR 5 MG PO TABS: ORAL_TABLET | ORAL | 0 refills | Status: DC

## 2017-10-04 NOTE — Progress Notes (Signed)
Psychiatric Initial Adult Assessment   Patient Identification: [name] MRN:  [id number] Date of Evaluation:  10/04/2017 Referral Source: Self-referred, therapist Ines Bloomer  Chief Complaint:  My medicine not working.  Visit Diagnosis:    ICD-10-CM   1. MDD (major depressive disorder), recurrent episode, moderate (HCC) F33.1 vortioxetine HBr (TRINTELLIX) 5 MG TABS tablet  2. PTSD (post-traumatic stress disorder) F43.10 vortioxetine HBr (TRINTELLIX) 5 MG TABS tablet    History of Present Illness: Patient is 47 year old Caucasian, married, unemployed man who is referred from his therapist Ines Bloomer for the management of depression and anxiety symptoms.  Patient has history of anxiety and depression for a long time however symptoms started to get worse in 2017 when he found that his wife has an affair with another man.  Patient told he confronted her in October 2017 and his wife accepted her mistake.  Though they are still living together but he admitted marriage has been very rough and tough.  Patient endorsed excessive sadness, worried about his future, having crying spells, lack of confidence, decreased ego and lack of interest in daily activities.  He had a dream about his wife having sex with another man and sometime he could not sleep very well.  He admitted significant marital stress and having arguments and verbal disagreement with the wife but denies any violence, aggression or any self abusive behavior.  He started seeing Ines Bloomer a therapist 7 months ago who is helping his depression.  The patient denies any nightmares or flashbacks but he has ruminative thoughts about his wife's affair.  He admitted lack of appetite, weight loss, irritability, anxiety around people.  He admitted social isolation, withdrawn, lack of energy, fatigue and anhedonia and sometimes hopeless and helpless.  His energy level is down.  He admitted that he used to enjoy life but lately he is preoccupied  to himself.  His primary care physician started him on Cymbalta year ago but he does not feel it is working.  Previously he was taking Prozac but after the affair he got very depressed and it was switched to Cymbalta.  Patient denies any paranoia, hallucination, OCD or any panic attacks.  He denies any mania, self abusive behavior.  Patient also have multiple health issues including multiple back surgeries, A. fib, chronic pain, GERD, hypertension and arthritis.  He takes pain medication and muscle relaxant.  Patient is willing to try a different medication but does not want any medication that cause sexual side effects.  Patient denies drinking alcohol or using any illegal substances.  He lives with his wife and his 2 sons who are 12 and 104 years old.  He is a daughter who lives in [place].  Patient is on disability due to his health condition.  His wife is a Designer, jewellery at [employer] Signs/Symptoms: Depression Symptoms:  depressed mood, insomnia, fatigue, feelings of worthlessness/guilt, difficulty concentrating, hopelessness, loss of energy/fatigue, disturbed sleep, (Hypo) Manic Symptoms:  Irritable Mood, Anxiety Symptoms:  Excessive Worry, Social Anxiety, Psychotic Symptoms:  no psychotic symtoms PTSD Symptoms: Negative  Past Psychiatric History: Patient denies any history of psychiatric inpatient treatment or any suicidal attempt.  He denies any history of mania, psychosis or any hallucination.  He has been taking Prozac for more than 10 years for his anxiety but recently it was switched to Cymbalta by his primary care physician.  Patient never seen psychiatrist before.  Patient denies any history of physical sexual verbal abuse.  Previous Psychotropic Medications:  Yes   Substance Abuse History in the last 12 months:  No.  Consequences of Substance Abuse: Negative  Past Medical History:  Past Medical History:  Diagnosis Date  . Anxiety   . Arthritis     back area  . Blood dyscrasia    thrombocytopenia  15 months  . Chronic back pain   . Complication of anesthesia    noticed some memory loss after surgery 07/18/12  . Depression    on prozac  . Dysrhythmia    PAF in 2012, felt related to OSA--now on CPAP; PRN follow-up Dr. Lin Givens 03/2011  . GERD (gastroesophageal reflux disease)    uses baking soda  . Hyperlipidemia   . Hypertension    Dr. Allayne Butcher, [facility]  . Pancreatitis    hx of  . Pneumonia    hx of  2005ish  . Sleep apnea    sleep study approx 18 months ago, uses cpap  . Thrombocytopenia, idiopathic (HCC)    age of 35 months    Past Surgical History:  Procedure Laterality Date  . APPENDECTOMY     19 months of age  . BACK SURGERY     x2 fusions, L5-S1, L4-L5  . CHOLECYSTECTOMY    . DIAGNOSTIC LAPAROSCOPY     exploratory lab, abdominal  . HERNIA REPAIR Left    inguinal  . REFRACTIVE SURGERY Bilateral 98  . SACROILIAC JOINT FUSION Right 11/21/2013   Procedure: RIGHT SACROILIAC JOINT FUSION;  Surgeon: Sinclair Ship, MD;  Location: [place];  Service: Orthopedics;  Laterality: Right;  Right sided sacroiliac joint fusion  . SACROILIAC JOINT FUSION Left 01/22/2014   Procedure: SACROILIAC JOINT FUSION;  Surgeon: Sinclair Ship, MD;  Location: [place];  Service: Orthopedics;  Laterality: Left;  Left sided sacroiliac joint fusion  . SPINAL CORD STIMULATOR IMPLANT  07/18/2012  . SPINAL CORD STIMULATOR INSERTION  07/18/2012   Procedure: LUMBAR SPINAL CORD STIMULATOR INSERTION;  Surgeon: Melina Schools, MD;  Location: [place];  Service: Orthopedics;  Laterality: N/A;  trial spinal cord stimulator implant   . SPINAL CORD STIMULATOR REMOVAL  07/26/2012   Procedure: LUMBAR SPINAL CORD STIMULATOR REMOVAL;  Surgeon: Melina Schools, MD;  Location: [place];  Service: Orthopedics;  Laterality: N/A;  REMOVAL OF TRIAL LEAD OR CONVERSION TO PERM SPINAL CORD STIMULATOR IMPLANT  . TONSILLECTOMY    .  VASECTOMY      Family Psychiatric History: Father has depression but never diagnosed.  Family History:  Family History  Problem Relation Age of Onset  . Heart attack Mother   . Heart attack Brother     Social History:   Social History   Socioeconomic History  . Marital status: Married    Spouse name: Not on file  . Number of children: Not on file  . Years of education: Not on file  . Highest education level: Not on file  Social Needs  . Financial resource strain: Not on file  . Food insecurity - worry: Not on file  . Food insecurity - inability: Not on file  . Transportation needs - medical: Not on file  . Transportation needs - non-medical: Not on file  Occupational History  . Not on file  Tobacco Use  . Smoking status: Current Some Day Smoker    Packs/day: 1.00    Years: 18.00    Pack years: 18.00    Types: Cigarettes  . Smokeless tobacco: Former Network engineer and Sexual Activity  .  Alcohol use: No  . Drug use: No  . Sexual activity: Yes  Other Topics Concern  . Not on file  Social History Narrative  . Not on file    Additional Social History: Patient born and raised in [place].  Is been married to his wife since 13.  He has 3 children.  Patient used to work however since 2009 he is on disability due to multiple health condition.  Allergies:   Allergies  Allergen Reactions  . Nexium [Esomeprazole Magnesium] Hives and Swelling  . Wellbutrin [Bupropion] Hives and Swelling    Metabolic Disorder Labs: No results found for: HGBA1C, MPG No results found for: PROLACTIN No results found for: CHOL, TRIG, HDL, CHOLHDL, VLDL, LDLCALC   Current Medications: Current Outpatient Medications  Medication Sig Dispense Refill  . acetaminophen (TYLENOL) 500 MG tablet Take 500 mg by mouth 3 (three) times daily.    Marland Kitchen albuterol (VENTOLIN HFA) 108 (90 Base) MCG/ACT inhaler Inhale 2 puffs into the lungs every 4 (four) hours as needed for wheezing or shortness of  breath. 1 Inhaler 5  . aspirin EC 81 MG tablet Take 1 tablet (81 mg total) by mouth daily. 90 tablet 3  . cyanocobalamin (,VITAMIN B-12,) 1000 MCG/ML injection   6  . DEPO-TESTOSTERONE 200 MG/ML injection   2  . diazepam (VALIUM) 5 MG tablet Take 5 mg by mouth 2 (two) times daily as needed for muscle spasms.     . DULoxetine (CYMBALTA) 60 MG capsule Take 60 mg by mouth daily.  5  . EPINEPHrine (EPIPEN 2-PAK) 0.3 mg/0.3 mL IJ SOAJ injection USE AS DIRECTED FOR LIFE THREATENING ALLERGIC REACTIONS 2 Device 3  . fluticasone (FLONASE) 50 MCG/ACT nasal spray Place 1 spray into both nostrils 2 (two) times daily. 16 g 5  . guaiFENesin (MUCINEX) 600 MG 12 hr tablet Take 600 mg by mouth 2 (two) times daily as needed.    Marland Kitchen ibuprofen (ADVIL,MOTRIN) 600 MG tablet Take 600 mg by mouth every 4 (four) hours. Reported on 01/27/2016    . lisinopril (PRINIVIL,ZESTRIL) 20 MG tablet Take 20 mg by mouth at bedtime.    . methocarbamol (ROBAXIN) 750 MG tablet Take 750 mg by mouth 3 (three) times daily as needed.  1  . montelukast (SINGULAIR) 10 MG tablet TAKE 1 TABLET BY MOUTH AT BEDTIME. 30 tablet 2  . Oxycodone HCl 10 MG TABS Take 10 mg by mouth every 4 (four) hours as needed (pain).   0  . Verapamil HCl CR 300 MG CP24 Take 300 mg by mouth at bedtime.     Current Facility-Administered Medications  Medication Dose Route Frequency Provider Last Rate Last Dose  . triamcinolone acetonide (KENALOG) 10 MG/ML injection 10 mg  10 mg Other Once Landis Martins, DPM      . triamcinolone acetonide (KENALOG) 10 MG/ML injection 10 mg  10 mg Other Once [place], [place], DPM      . triamcinolone acetonide (KENALOG) 10 MG/ML injection 10 mg  10 mg Other Once Landis Martins, DPM        Neurologic: Headache: No Seizure: No Paresthesias:No  Musculoskeletal: Strength & Muscle Tone: within normal limits Gait & Station: normal Patient leans: N/A  Psychiatric Specialty Exam: Review of Systems  Constitutional: Positive for  malaise/fatigue.  Musculoskeletal: Positive for back pain and joint pain.  Neurological: Positive for weakness.  Psychiatric/Behavioral: Positive for depression. The patient is nervous/anxious and has insomnia.     Blood pressure 126/78, pulse 78, height 6\' 1"  (  1.854 m), weight 269 lb 12.8 oz (122.4 kg).There is no height or weight on file to calculate BMI.  General Appearance: Casual  Eye Contact:  Fair  Speech:  Slow  Volume:  Decreased  Mood:  Anxious, Depressed and Dysphoric  Affect:  Constricted and Depressed  Thought Process:  Goal Directed  Orientation:  Full (Time, Place, and Person)  Thought Content:  Rumination  Suicidal Thoughts:  No  Homicidal Thoughts:  No  Memory:  Immediate;   Good Recent;   Good Remote;   Good  Judgement:  Good  Insight:  Good  Psychomotor Activity:  Decreased  Concentration:  Concentration: Fair and Attention Span: Fair  Recall:  Good  Fund of Knowledge:Good  Language: Good  Akathisia:  No  Handed:  Right  AIMS (if indicated):  0  Assets:  Communication Skills Desire for Improvement Housing Resilience  ADL's:  Intact  Cognition: WNL  Sleep: Fair   Assessment: Major depressive disorder, recurrent.  Rule out posttraumatic stress disorder.  Plan: Review symptoms, history, current medication.  Patient is experiencing increased depression, social isolation, racing thoughts and anxiety symptoms.  His current medications Cymbalta 60 mg not working.  I recommended to taper down Cymbalta to take 30 mg daily or 60 mg every other day for 2 weeks and then stop.  We will try Trentellix 5 mg for 1 week and then 10 mg daily.  Patient does not want any medication that causes sexual side effects.  Discussed medication side effects and benefits.  Encourage to continue counseling with Ines Bloomer however I also recommended should consider marriage counseling.  Recommended to call us back if he has any question or any concern.  Discussed safety concerns at any  time having active suicidal thoughts or homicidal thought that he need to call 911 or go to local emergency room.  Patient is getting pain medication and muscle relaxant from other providers.  Kathlee Nations, MD 2/27/20198:55 AM

## 2017-10-05 MED FILL — METHOCARBAMOL 750 MG TABS: 750 | 30 days supply | Qty: 120 | Fill #0

## 2017-10-09 DIAGNOSIS — F4323 Adjustment disorder with mixed anxiety and depressed mood: Secondary | ICD-10-CM | POA: Diagnosis not present

## 2017-10-11 NOTE — Progress Notes (Signed)
Cardiology Office Note:    Date:  10/12/2017   ID:  [name], DOB 1970-10-26, MRN [id number]  PCP:  Mateo Flow, MD  Cardiologist:  Shirlee More, MD    Referring MD: Mateo Flow, MD    ASSESSMENT:    1. PAF (paroxysmal atrial fibrillation) ([place])   2. Essential hypertension   3. Chest pain in adult    PLAN:    In order of problems listed above:  1. Stable no clinical recurrence I reviewed his recent Holter monitor with him and he declined an EKG in my office today.  We will continue current treatment including aspirin and rate limiting calcium channel blocker 2. Home blood pressure at pressure is elevated in the evenings and I asked him to take any extra it 10 mg of lisinopril at bedtime. 3. Stable no recurrence myocardial perfusion study showed no ischemia.  At this time I would not refer him for coronary angiography   Next appointment: 6 months   Medication Adjustments/Labs and Tests Ordered: Current medicines are reviewed at length with the patient today.  Concerns regarding medicines are outlined above.  No orders of the defined types were placed in this encounter.  Meds ordered this encounter  Medications  . lisinopril (PRINIVIL,ZESTRIL) 20 MG tablet    Sig: Take 1 tablet (20 mg total) by mouth at bedtime. Take an extra 1/2 = 10 mg in the evening    Dispense:  30 tablet    Refill:  3    Chief Complaint  Patient presents with  . Follow-up    Routine flup appt     History of Present Illness:    [name] is a 47 y.o. male with a hx of PAF and chest pain last seen 03/24/17.  ASSESSMENT:   03/24/16   1. PAF (paroxysmal atrial fibrillation) (HCC)   2. Chest pain in adult   3. Chest pain, unspecified type   4. Essential hypertension    PLAN:    1. Further evaluation with 30 day event monitor and a decision on treatment of atrial fibrillation. B documented. 2. Further evaluation myocardial perfusion study 3. Continue current  antihypertensive  Compliance with diet, lifestyle and medications: Yes  Is very distressed regarding marital problems has had no chest pain palpitation syncope or TIA.  Tolerates medications evening blood pressures run in the [phone] range.  Walking out the door he asked me about his lipids he has a low HDL and I told him fenofibrate is important appropriate treatment copies of recent lipids requested if his LDL is significantly elevated he benefit from statin therapy Past Medical History:  Diagnosis Date  . Anxiety   . Arthritis    back area  . Blood dyscrasia    thrombocytopenia  15 months  . Chronic back pain   . Complication of anesthesia    noticed some memory loss after surgery 07/18/12  . Depression    on prozac  . Dysrhythmia    PAF in 2012, felt related to OSA--now on CPAP; PRN follow-up Dr. Lin Givens 03/2011  . GERD (gastroesophageal reflux disease)    uses baking soda  . Hyperlipidemia   . Hypertension    Dr. Allayne Butcher, [facility]  . Pancreatitis    hx of  . Pneumonia    hx of  2005ish  . Sleep apnea    sleep study approx 18 months ago, uses cpap  . Thrombocytopenia, idiopathic (HCC)  age of 2 months    Past Surgical History:  Procedure Laterality Date  . APPENDECTOMY     36 months of age  . BACK SURGERY     x2 fusions, L5-S1, L4-L5  . CHOLECYSTECTOMY    . DIAGNOSTIC LAPAROSCOPY     exploratory lab, abdominal  . HERNIA REPAIR Left    inguinal  . REFRACTIVE SURGERY Bilateral 98  . SACROILIAC JOINT FUSION Right 11/21/2013   Procedure: RIGHT SACROILIAC JOINT FUSION;  Surgeon: Sinclair Ship, MD;  Location: [place];  Service: Orthopedics;  Laterality: Right;  Right sided sacroiliac joint fusion  . SACROILIAC JOINT FUSION Left 01/22/2014   Procedure: SACROILIAC JOINT FUSION;  Surgeon: Sinclair Ship, MD;  Location: [place];  Service: Orthopedics;  Laterality: Left;  Left sided sacroiliac joint fusion  . SPINAL CORD  STIMULATOR IMPLANT  07/18/2012  . SPINAL CORD STIMULATOR INSERTION  07/18/2012   Procedure: LUMBAR SPINAL CORD STIMULATOR INSERTION;  Surgeon: Melina Schools, MD;  Location: [place];  Service: Orthopedics;  Laterality: N/A;  trial spinal cord stimulator implant   . SPINAL CORD STIMULATOR REMOVAL  07/26/2012   Procedure: LUMBAR SPINAL CORD STIMULATOR REMOVAL;  Surgeon: Melina Schools, MD;  Location: [place];  Service: Orthopedics;  Laterality: N/A;  REMOVAL OF TRIAL LEAD OR CONVERSION TO PERM SPINAL CORD STIMULATOR IMPLANT  . TONSILLECTOMY    . VASECTOMY      Current Medications: Current Meds  Medication Sig  . acetaminophen (TYLENOL) 500 MG tablet Take 500 mg by mouth 3 (three) times daily.  Marland Kitchen aspirin EC 81 MG tablet Take 1 tablet (81 mg total) by mouth daily.  . cyanocobalamin (,VITAMIN B-12,) 1000 MCG/ML injection   . DEPO-TESTOSTERONE 200 MG/ML injection   . fenofibrate 160 MG tablet Take 160 mg by mouth daily.  Marland Kitchen ibuprofen (ADVIL,MOTRIN) 600 MG tablet Take 600 mg by mouth every 4 (four) hours. Reported on 01/27/2016  . lisinopril (PRINIVIL,ZESTRIL) 20 MG tablet Take 1 tablet (20 mg total) by mouth at bedtime. Take an extra 1/2 = 10 mg in the evening  . methocarbamol (ROBAXIN) 750 MG tablet Take 750 mg by mouth 3 (three) times daily as needed.  . Oxycodone HCl 10 MG TABS Take 10 mg by mouth every 4 (four) hours as needed (pain).   . Verapamil HCl CR 300 MG CP24 Take 300 mg by mouth at bedtime.  . vortioxetine HBr (TRINTELLIX) 5 MG TABS tablet Take 5 mg daily for 1 week and than 10 mg daily  . [DISCONTINUED] lisinopril (PRINIVIL,ZESTRIL) 20 MG tablet Take 20 mg by mouth at bedtime.   Current Facility-Administered Medications for the 10/12/17 encounter (Office Visit) with Richardo Priest, MD  Medication  . triamcinolone acetonide (KENALOG) 10 MG/ML injection 10 mg  . triamcinolone acetonide (KENALOG) 10 MG/ML injection 10 mg  . triamcinolone acetonide (KENALOG) 10 MG/ML injection 10 mg      Allergies:   Nexium [esomeprazole magnesium] and Wellbutrin [bupropion]   Social History   Socioeconomic History  . Marital status: Married    Spouse name: None  . Number of children: None  . Years of education: None  . Highest education level: None  Social Needs  . Financial resource strain: None  . Food insecurity - worry: None  . Food insecurity - inability: None  . Transportation needs - medical: None  . Transportation needs - non-medical: None  Occupational History  . None  Tobacco Use  . Smoking status: Current Some Day Smoker  Packs/day: 1.00    Years: 18.00    Pack years: 18.00    Types: Cigarettes  . Smokeless tobacco: Former Network engineer and Sexual Activity  . Alcohol use: No  . Drug use: No  . Sexual activity: Yes  Other Topics Concern  . None  Social History Narrative  . None     Family History: The patient's family history includes Heart attack in his brother and mother. ROS:   Please see the history of present illness.    All other systems reviewed and are negative.  EKGs/Labs/Other Studies Reviewed:    The following studies were reviewed today Holter monitor: Study Highlights   Dates: 05/06/17- 05/04/17 Indication: palpitation Ordering: Dr Bettina Gavia Interpreting: Dr Bettina Gavia Baseline transmission:Sinus rhythm Atrial arrhythmia:1 symptomatic event with occasional APC's Ventricular arrhythmia: 2 symptomatic events with frequent PVC's Conduction abnormality: none Bradycardia: none Symptoms:22 episodes of palpitation, SOB, lightheaded and fatigue Conclusion: Symptomatic PVC's, although the symptomatic events were predominantly unassociated with arrhythmia   Study Highlights   Nuclear stress EF: 48%. The left ventricular ejection fraction is mildly decreased (45-54%).  There is no evidence of ischemia or infarction. The LV function is mildly reduced.  This is a low risk study.     Recent Labs: No results found for requested labs  within last 8760 hours.  Recent Lipid Panel No results found for: CHOL, TRIG, HDL, CHOLHDL, VLDL, LDLCALC, LDLDIRECT  Physical Exam:    VS:  BP 124/84 (BP Location: Right Arm, Patient Position: Sitting, Cuff Size: Large)   Pulse 96   Ht 6\' 1"  (1.854 m)   Wt 271 lb (122.9 kg)   SpO2 98%   BMI 35.75 kg/m     Wt Readings from Last 3 Encounters:  10/12/17 271 lb (122.9 kg)  04/05/17 253 lb (114.8 kg)  03/24/17 253 lb (114.8 kg)     GEN:  Well nourished, well developed in no acute distress HEENT: Normal NECK: No JVD; No carotid bruits LYMPHATICS: No lymphadenopathy CARDIAC: RRR, no murmurs, rubs, gallops RESPIRATORY:  Clear to auscultation without rales, wheezing or rhonchi  ABDOMEN: Soft, non-tender, non-distended MUSCULOSKELETAL:  No edema; No deformity  SKIN: Warm and dry NEUROLOGIC:  Alert and oriented x 3 PSYCHIATRIC:  Normal affect    Signed, Shirlee More, MD  10/12/2017 12:48 PM    [facility]

## 2017-10-12 ENCOUNTER — Ambulatory Visit: Payer: 59 | Admitting: Cardiology

## 2017-10-12 ENCOUNTER — Encounter: Payer: Self-pay | Admitting: Cardiology

## 2017-10-12 VITALS — BP 124/84 | HR 96 | Ht 73.0 in | Wt 271.0 lb

## 2017-10-12 DIAGNOSIS — I480 Paroxysmal atrial fibrillation: Secondary | ICD-10-CM | POA: Diagnosis not present

## 2017-10-12 DIAGNOSIS — R079 Chest pain, unspecified: Secondary | ICD-10-CM

## 2017-10-12 DIAGNOSIS — I10 Essential (primary) hypertension: Secondary | ICD-10-CM

## 2017-10-12 MED ORDER — LISINOPRIL 20 MG PO TABS: 20.0000 mg | ORAL_TABLET | Freq: Every day | ORAL | 3 refills | Status: DC

## 2017-10-12 NOTE — Patient Instructions (Signed)
Medication Instructions:  Your physician recommends that you continue on your current medications as directed. Please refer to the Current Medication list given to you today.   Labwork: None  Testing/Procedures: None  Follow-Up: Your physician wants you to follow-up in: 6 months or as needed. You will receive a reminder letter in the mail two months in advance. If you don't receive a letter, please call our office to schedule the follow-up appointment.   Any Other Special Instructions Will Be Listed Below (If Applicable).     If you need a refill on your cardiac medications before your next appointment, please call your pharmacy.

## 2017-10-17 MED FILL — DULoxetine HCL 60 MG CPEP: 60 | 30 days supply | Qty: 30 | Fill #0

## 2017-10-25 MED FILL — LISINOPRIL 20 MG TABLET: 20 | 90 days supply | Qty: 90 | Fill #0

## 2017-10-25 MED FILL — VERAPAMIL ER PM 300 MG CAP: 300 | 90 days supply | Qty: 90 | Fill #0

## 2017-10-25 MED FILL — FENOFIBRATE 160 MG TABLET: 160 | 90 days supply | Qty: 90 | Fill #1

## 2017-10-26 ENCOUNTER — Ambulatory Visit (HOSPITAL_COMMUNITY): Payer: 59 | Admitting: Psychiatry

## 2017-10-26 DIAGNOSIS — G894 Chronic pain syndrome: Secondary | ICD-10-CM | POA: Diagnosis not present

## 2017-10-26 DIAGNOSIS — M961 Postlaminectomy syndrome, not elsewhere classified: Secondary | ICD-10-CM | POA: Diagnosis not present

## 2017-10-26 DIAGNOSIS — Z79891 Long term (current) use of opiate analgesic: Secondary | ICD-10-CM | POA: Diagnosis not present

## 2017-10-26 DIAGNOSIS — F4322 Adjustment disorder with anxiety: Secondary | ICD-10-CM | POA: Diagnosis not present

## 2017-10-31 MED FILL — METHOCARBAMOL 750 MG TABS: 750 | 30 days supply | Qty: 120 | Fill #1

## 2017-10-31 MED FILL — oxyCODONE HCL 10 MG TABS: 10 | 30 days supply | Qty: 180 | Fill #0

## 2017-11-15 MED FILL — DULoxetine HCL 60 MG CPEP: 60 | 30 days supply | Qty: 30 | Fill #1

## 2017-11-17 DIAGNOSIS — F4323 Adjustment disorder with mixed anxiety and depressed mood: Secondary | ICD-10-CM | POA: Diagnosis not present

## 2017-11-21 DIAGNOSIS — Z6837 Body mass index (BMI) 37.0-37.9, adult: Secondary | ICD-10-CM | POA: Diagnosis not present

## 2017-11-21 DIAGNOSIS — J069 Acute upper respiratory infection, unspecified: Secondary | ICD-10-CM | POA: Diagnosis not present

## 2017-11-21 DIAGNOSIS — F419 Anxiety disorder, unspecified: Secondary | ICD-10-CM | POA: Diagnosis not present

## 2017-11-24 MED FILL — MONTELUKAST SOD 10 MG TAB: 10 | 90 days supply | Qty: 90 | Fill #0

## 2017-11-30 MED FILL — METHOCARBAMOL 750 MG TABS: 750 | 30 days supply | Qty: 120 | Fill #0

## 2017-11-30 MED FILL — oxyCODONE HCL 10 MG TABS: 10 | 30 days supply | Qty: 180 | Fill #0

## 2017-12-15 MED FILL — DULoxetine HCL 60 MG CPEP: 60 | 30 days supply | Qty: 30 | Fill #2

## 2017-12-21 DIAGNOSIS — F4322 Adjustment disorder with anxiety: Secondary | ICD-10-CM | POA: Diagnosis not present

## 2017-12-21 DIAGNOSIS — Z79891 Long term (current) use of opiate analgesic: Secondary | ICD-10-CM | POA: Diagnosis not present

## 2017-12-21 DIAGNOSIS — G894 Chronic pain syndrome: Secondary | ICD-10-CM | POA: Diagnosis not present

## 2017-12-21 DIAGNOSIS — M961 Postlaminectomy syndrome, not elsewhere classified: Secondary | ICD-10-CM | POA: Diagnosis not present

## 2017-12-27 MED FILL — METHOCARBAMOL 750 MG TABS: 750 | 30 days supply | Qty: 120 | Fill #1

## 2018-01-02 MED FILL — oxyCODONE HCL 10 MG TABS: 10 | 30 days supply | Qty: 180 | Fill #0

## 2018-01-03 DIAGNOSIS — M545 Low back pain: Secondary | ICD-10-CM | POA: Diagnosis not present

## 2018-01-03 DIAGNOSIS — M9905 Segmental and somatic dysfunction of pelvic region: Secondary | ICD-10-CM | POA: Diagnosis not present

## 2018-01-03 DIAGNOSIS — M9902 Segmental and somatic dysfunction of thoracic region: Secondary | ICD-10-CM | POA: Diagnosis not present

## 2018-01-03 DIAGNOSIS — M9903 Segmental and somatic dysfunction of lumbar region: Secondary | ICD-10-CM | POA: Diagnosis not present

## 2018-01-10 DIAGNOSIS — M9902 Segmental and somatic dysfunction of thoracic region: Secondary | ICD-10-CM | POA: Diagnosis not present

## 2018-01-10 DIAGNOSIS — M545 Low back pain: Secondary | ICD-10-CM | POA: Diagnosis not present

## 2018-01-10 DIAGNOSIS — M9903 Segmental and somatic dysfunction of lumbar region: Secondary | ICD-10-CM | POA: Diagnosis not present

## 2018-01-10 DIAGNOSIS — M9905 Segmental and somatic dysfunction of pelvic region: Secondary | ICD-10-CM | POA: Diagnosis not present

## 2018-01-12 MED FILL — OMEPRAZOLE 20 MG CAP: 20 | 90 days supply | Qty: 90 | Fill #1

## 2018-01-12 MED FILL — DULoxetine HCL 60 MG CPEP: 60 | 30 days supply | Qty: 30 | Fill #3

## 2018-01-16 DIAGNOSIS — M9905 Segmental and somatic dysfunction of pelvic region: Secondary | ICD-10-CM | POA: Diagnosis not present

## 2018-01-16 DIAGNOSIS — M9903 Segmental and somatic dysfunction of lumbar region: Secondary | ICD-10-CM | POA: Diagnosis not present

## 2018-01-16 DIAGNOSIS — M9902 Segmental and somatic dysfunction of thoracic region: Secondary | ICD-10-CM | POA: Diagnosis not present

## 2018-01-16 DIAGNOSIS — M545 Low back pain: Secondary | ICD-10-CM | POA: Diagnosis not present

## 2018-01-19 DIAGNOSIS — G894 Chronic pain syndrome: Secondary | ICD-10-CM | POA: Diagnosis not present

## 2018-01-19 DIAGNOSIS — M961 Postlaminectomy syndrome, not elsewhere classified: Secondary | ICD-10-CM | POA: Diagnosis not present

## 2018-01-19 DIAGNOSIS — F4322 Adjustment disorder with anxiety: Secondary | ICD-10-CM | POA: Diagnosis not present

## 2018-01-19 DIAGNOSIS — Z79891 Long term (current) use of opiate analgesic: Secondary | ICD-10-CM | POA: Diagnosis not present

## 2018-01-19 MED FILL — HYDROmorphone HCL 4 MG TABS: 4 | 30 days supply | Qty: 180 | Fill #0

## 2018-01-24 MED FILL — METHOCARBAMOL 750 MG TABS: 750 | 30 days supply | Qty: 120 | Fill #0 | Status: TO

## 2018-01-25 MED FILL — VERAPAMIL ER PM 300 MG CAP: 300 | 90 days supply | Qty: 90 | Fill #1

## 2018-01-25 MED FILL — LISINOPRIL 20 MG TABLET: 20 | 90 days supply | Qty: 90 | Fill #1

## 2018-02-02 MED FILL — FENOFIBRATE 160 MG TABLET: 160 | 90 days supply | Qty: 90 | Fill #0

## 2018-02-09 MED FILL — DULoxetine HCL 60 MG CPEP: 60 | 30 days supply | Qty: 30 | Fill #4

## 2018-02-14 MED FILL — TESTOSTERONE CYP 200 MG/ML: 200 | 84 days supply | Qty: 6 | Fill #0

## 2018-02-14 MED FILL — CYANOCOBALAMIN 1,000 MCG/ML: 1000 | 84 days supply | Qty: 6 | Fill #1

## 2018-02-21 DIAGNOSIS — G894 Chronic pain syndrome: Secondary | ICD-10-CM | POA: Diagnosis not present

## 2018-02-21 DIAGNOSIS — Z79891 Long term (current) use of opiate analgesic: Secondary | ICD-10-CM | POA: Diagnosis not present

## 2018-02-21 DIAGNOSIS — F4322 Adjustment disorder with anxiety: Secondary | ICD-10-CM | POA: Diagnosis not present

## 2018-02-21 DIAGNOSIS — M961 Postlaminectomy syndrome, not elsewhere classified: Secondary | ICD-10-CM | POA: Diagnosis not present

## 2018-02-21 MED FILL — METHOCARBAMOL 750 MG TABS: 750 | 30 days supply | Qty: 120 | Fill #0

## 2018-02-21 MED FILL — MONTELUKAST SOD 10 MG TAB: 10 | 90 days supply | Qty: 90 | Fill #1

## 2018-02-21 MED FILL — oxyCODONE HCL 10 MG TABS: 10 | 30 days supply | Qty: 180 | Fill #0

## 2018-03-01 DIAGNOSIS — J40 Bronchitis, not specified as acute or chronic: Secondary | ICD-10-CM | POA: Diagnosis not present

## 2018-03-01 DIAGNOSIS — J329 Chronic sinusitis, unspecified: Secondary | ICD-10-CM | POA: Diagnosis not present

## 2018-03-06 DIAGNOSIS — M9902 Segmental and somatic dysfunction of thoracic region: Secondary | ICD-10-CM | POA: Diagnosis not present

## 2018-03-06 DIAGNOSIS — M9905 Segmental and somatic dysfunction of pelvic region: Secondary | ICD-10-CM | POA: Diagnosis not present

## 2018-03-06 DIAGNOSIS — M9903 Segmental and somatic dysfunction of lumbar region: Secondary | ICD-10-CM | POA: Diagnosis not present

## 2018-03-06 DIAGNOSIS — M545 Low back pain: Secondary | ICD-10-CM | POA: Diagnosis not present

## 2018-03-12 ENCOUNTER — Telehealth (HOSPITAL_COMMUNITY): Payer: Self-pay | Admitting: Licensed Clinical Social Worker

## 2018-03-12 ENCOUNTER — Encounter (HOSPITAL_COMMUNITY): Payer: Self-pay | Admitting: Licensed Clinical Social Worker

## 2018-03-12 ENCOUNTER — Ambulatory Visit (INDEPENDENT_AMBULATORY_CARE_PROVIDER_SITE_OTHER): Payer: 59 | Admitting: Licensed Clinical Social Worker

## 2018-03-12 DIAGNOSIS — F331 Major depressive disorder, recurrent, moderate: Secondary | ICD-10-CM

## 2018-03-12 MED FILL — DULoxetine HCL 60 MG CPEP: 60 | 30 days supply | Qty: 30 | Fill #5

## 2018-03-12 NOTE — Progress Notes (Signed)
Pt came in for initial appointment today. He called Friday requesting marriage counseling. [facility] staff discussed with me. I told front desk staff marriage counseling is out of my scope of practice. The husband made the appt for he and his wife. She did not come to the appointment. I met with pt but did not complete a CCA. He was too upset to answer questions. We discussed if he would like individual therapy. I gave pt information on IOP and individual therapy but advised him I don't have any available appointments for over a month. Pt discussed his wife's affair, his reason for wanting to save their marriage. I continued to ask him what he wants to do in therapy. I also have 4 names for referrals for marriage counseling. I encouraged the pt to go home and think about what he wants in therapy and to let me know. Went ahead and made an individual appt to see me.  [name] 11:10-12pm 03/12/18

## 2018-03-22 DIAGNOSIS — Z79891 Long term (current) use of opiate analgesic: Secondary | ICD-10-CM | POA: Diagnosis not present

## 2018-03-22 DIAGNOSIS — G894 Chronic pain syndrome: Secondary | ICD-10-CM | POA: Diagnosis not present

## 2018-03-22 DIAGNOSIS — F4322 Adjustment disorder with anxiety: Secondary | ICD-10-CM | POA: Diagnosis not present

## 2018-03-22 DIAGNOSIS — M961 Postlaminectomy syndrome, not elsewhere classified: Secondary | ICD-10-CM | POA: Diagnosis not present

## 2018-03-22 MED FILL — METHOCARBAMOL 750 MG TABS: 750 | 30 days supply | Qty: 120 | Fill #0

## 2018-03-22 MED FILL — oxyCODONE HCL 10 MG TABS: 10 | 30 days supply | Qty: 180 | Fill #0

## 2018-03-29 ENCOUNTER — Ambulatory Visit (HOSPITAL_COMMUNITY): Payer: 59 | Admitting: Psychiatry

## 2018-04-03 DIAGNOSIS — Z6836 Body mass index (BMI) 36.0-36.9, adult: Secondary | ICD-10-CM | POA: Diagnosis not present

## 2018-04-03 DIAGNOSIS — J069 Acute upper respiratory infection, unspecified: Secondary | ICD-10-CM | POA: Diagnosis not present

## 2018-04-13 DIAGNOSIS — J40 Bronchitis, not specified as acute or chronic: Secondary | ICD-10-CM | POA: Diagnosis not present

## 2018-04-13 DIAGNOSIS — Z6836 Body mass index (BMI) 36.0-36.9, adult: Secondary | ICD-10-CM | POA: Diagnosis not present

## 2018-04-16 MED FILL — DULoxetine HCL 60 MG CPEP: 60 | 30 days supply | Qty: 30 | Fill #6

## 2018-04-20 MED FILL — oxyCODONE HCL 10 MG TABS: 10 | 30 days supply | Qty: 180 | Fill #0

## 2018-04-20 MED FILL — METHOCARBAMOL 750 MG TABS: 750 | 30 days supply | Qty: 120 | Fill #1

## 2018-04-24 ENCOUNTER — Telehealth (HOSPITAL_COMMUNITY): Payer: Self-pay | Admitting: Licensed Clinical Social Worker

## 2018-04-24 ENCOUNTER — Ambulatory Visit (HOSPITAL_COMMUNITY): Payer: 59 | Admitting: Licensed Clinical Social Worker

## 2018-04-24 NOTE — Telephone Encounter (Signed)
Pt called saying his wife was coming for his appt. He doesn't need therapy but she does. Told him i don't have a ROI to talk to his wife. Confirmed this with S.Lovena Le, RN. Wife was told she was not onschedule & she would need to schedule for herself.    By Jenkins Rouge, LCAS

## 2018-04-30 ENCOUNTER — Telehealth (HOSPITAL_COMMUNITY): Payer: Self-pay | Admitting: Licensed Clinical Social Worker

## 2018-04-30 NOTE — Telephone Encounter (Signed)
Called pt to see if he was going to return to therapy. On a phone call last week he said he didn't need therapy, his wife did. He does want to continue in therapy but his son is sick and can't come tomorrow for appt

## 2018-05-01 ENCOUNTER — Ambulatory Visit (HOSPITAL_COMMUNITY): Payer: Self-pay | Admitting: Licensed Clinical Social Worker

## 2018-05-03 MED FILL — LISINOPRIL 20 MG TABLET: 20 | 90 days supply | Qty: 90 | Fill #2

## 2018-05-03 MED FILL — FENOFIBRATE 160 MG TABLET: 160 | 90 days supply | Qty: 90 | Fill #1

## 2018-05-03 MED FILL — VERAPAMIL ER PM 300 MG CAP: 300 | 90 days supply | Qty: 90 | Fill #2

## 2018-05-08 ENCOUNTER — Ambulatory Visit (HOSPITAL_COMMUNITY): Payer: Self-pay | Admitting: Licensed Clinical Social Worker

## 2018-05-14 MED FILL — DULoxetine HCL 60 MG CPEP: 60 | 30 days supply | Qty: 30 | Fill #0 | Status: TO

## 2018-05-17 DIAGNOSIS — M961 Postlaminectomy syndrome, not elsewhere classified: Secondary | ICD-10-CM | POA: Diagnosis not present

## 2018-05-17 DIAGNOSIS — Z79891 Long term (current) use of opiate analgesic: Secondary | ICD-10-CM | POA: Diagnosis not present

## 2018-05-17 DIAGNOSIS — F4322 Adjustment disorder with anxiety: Secondary | ICD-10-CM | POA: Diagnosis not present

## 2018-05-17 DIAGNOSIS — G894 Chronic pain syndrome: Secondary | ICD-10-CM | POA: Diagnosis not present

## 2018-05-17 MED FILL — METHOCARBAMOL 750 MG TABS: 750 | 30 days supply | Qty: 120 | Fill #0

## 2018-05-18 MED FILL — oxyCODONE HCL 10 MG TABS: 10 | 30 days supply | Qty: 180 | Fill #0

## 2018-05-24 DIAGNOSIS — M9903 Segmental and somatic dysfunction of lumbar region: Secondary | ICD-10-CM | POA: Diagnosis not present

## 2018-05-24 DIAGNOSIS — M9902 Segmental and somatic dysfunction of thoracic region: Secondary | ICD-10-CM | POA: Diagnosis not present

## 2018-05-24 DIAGNOSIS — M545 Low back pain: Secondary | ICD-10-CM | POA: Diagnosis not present

## 2018-05-24 DIAGNOSIS — M9905 Segmental and somatic dysfunction of pelvic region: Secondary | ICD-10-CM | POA: Diagnosis not present

## 2018-05-31 DIAGNOSIS — M9903 Segmental and somatic dysfunction of lumbar region: Secondary | ICD-10-CM | POA: Diagnosis not present

## 2018-05-31 DIAGNOSIS — M545 Low back pain: Secondary | ICD-10-CM | POA: Diagnosis not present

## 2018-05-31 DIAGNOSIS — M9902 Segmental and somatic dysfunction of thoracic region: Secondary | ICD-10-CM | POA: Diagnosis not present

## 2018-05-31 DIAGNOSIS — M9905 Segmental and somatic dysfunction of pelvic region: Secondary | ICD-10-CM | POA: Diagnosis not present

## 2018-06-01 MED FILL — CYANOCOBALAMIN 1,000 MCG/ML: 1000 | 84 days supply | Qty: 6 | Fill #0

## 2018-06-01 MED FILL — MONTELUKAST SOD 10 MG TAB: 10 | 90 days supply | Qty: 90 | Fill #2

## 2018-06-01 MED FILL — TESTOSTERONE CYP 200 MG/ML: 200 | 84 days supply | Qty: 6 | Fill #0

## 2018-06-12 MED FILL — DULoxetine HCL 60 MG CPEP: 60 | 30 days supply | Qty: 30 | Fill #0

## 2018-06-14 MED FILL — METHOCARBAMOL 750 MG TABS: 750 | 30 days supply | Qty: 120 | Fill #1

## 2018-06-15 MED FILL — oxyCODONE HCL 10 MG TABS: 10 | 30 days supply | Qty: 180 | Fill #0

## 2018-06-19 DIAGNOSIS — Z6837 Body mass index (BMI) 37.0-37.9, adult: Secondary | ICD-10-CM | POA: Diagnosis not present

## 2018-06-19 DIAGNOSIS — M549 Dorsalgia, unspecified: Secondary | ICD-10-CM | POA: Diagnosis not present

## 2018-06-19 DIAGNOSIS — G8929 Other chronic pain: Secondary | ICD-10-CM | POA: Diagnosis not present

## 2018-07-11 DIAGNOSIS — Z6838 Body mass index (BMI) 38.0-38.9, adult: Secondary | ICD-10-CM | POA: Diagnosis not present

## 2018-07-11 DIAGNOSIS — J069 Acute upper respiratory infection, unspecified: Secondary | ICD-10-CM | POA: Diagnosis not present

## 2018-07-12 DIAGNOSIS — M4608 Spinal enthesopathy, sacral and sacrococcygeal region: Secondary | ICD-10-CM | POA: Diagnosis not present

## 2018-07-12 DIAGNOSIS — M961 Postlaminectomy syndrome, not elsewhere classified: Secondary | ICD-10-CM | POA: Diagnosis not present

## 2018-07-12 DIAGNOSIS — G894 Chronic pain syndrome: Secondary | ICD-10-CM | POA: Diagnosis not present

## 2018-07-12 DIAGNOSIS — Z79891 Long term (current) use of opiate analgesic: Secondary | ICD-10-CM | POA: Diagnosis not present

## 2018-07-12 MED FILL — METHOCARBAMOL 750 MG TABS: 750 | 30 days supply | Qty: 120 | Fill #0

## 2018-07-13 MED FILL — DULOXETINE HCL 60 MG CPEP: 60 | 30 days supply | Qty: 30 | Fill #1

## 2018-07-13 MED FILL — oxyCODONE HCL 10 MG TABS: 10 | 30 days supply | Qty: 180 | Fill #0

## 2018-08-09 MED FILL — FENOFIBRATE 160 MG TABLET: 160 | 90 days supply | Qty: 90 | Fill #2

## 2018-08-09 MED FILL — LISINOPRIL 20 MG TABLET: 20 | 90 days supply | Qty: 90 | Fill #3

## 2018-08-09 MED FILL — VERAPAMIL ER PM 300 MG CAP: 300 | 90 days supply | Qty: 90 | Fill #3

## 2018-08-13 DIAGNOSIS — J329 Chronic sinusitis, unspecified: Secondary | ICD-10-CM | POA: Diagnosis not present

## 2018-08-13 DIAGNOSIS — J40 Bronchitis, not specified as acute or chronic: Secondary | ICD-10-CM | POA: Diagnosis not present

## 2018-08-13 DIAGNOSIS — Z6839 Body mass index (BMI) 39.0-39.9, adult: Secondary | ICD-10-CM | POA: Diagnosis not present

## 2018-08-13 MED FILL — DOXYCYCLINE HYCLATE 100 MG: 100 | 10 days supply | Qty: 20 | Fill #0

## 2018-08-13 MED FILL — VENTOLIN HFA 90 MCG INHALER: 108 (90 BAS | 16 days supply | Qty: 18 | Fill #0

## 2018-08-13 MED FILL — PROMETHAZINE W/DM SYRUP: 6.25-15 | 10 days supply | Qty: 50 | Fill #0

## 2018-08-13 MED FILL — METHOCARBAMOL 750 MG TABS: 750 | 30 days supply | Qty: 120 | Fill #1

## 2018-08-13 MED FILL — oxyCODONE HCL 10 MG TABS: 10 | 30 days supply | Qty: 180 | Fill #0

## 2018-08-17 DIAGNOSIS — J329 Chronic sinusitis, unspecified: Secondary | ICD-10-CM | POA: Diagnosis not present

## 2018-08-17 DIAGNOSIS — Z720 Tobacco use: Secondary | ICD-10-CM | POA: Diagnosis not present

## 2018-08-17 DIAGNOSIS — Z6839 Body mass index (BMI) 39.0-39.9, adult: Secondary | ICD-10-CM | POA: Diagnosis not present

## 2018-08-17 DIAGNOSIS — J40 Bronchitis, not specified as acute or chronic: Secondary | ICD-10-CM | POA: Diagnosis not present

## 2018-08-17 MED FILL — CHANTIX STARTING MONTH BOX: 0.5 MG X 11 | 28 days supply | Qty: 53 | Fill #0

## 2018-08-23 DIAGNOSIS — M961 Postlaminectomy syndrome, not elsewhere classified: Secondary | ICD-10-CM | POA: Diagnosis not present

## 2018-08-23 DIAGNOSIS — Z79891 Long term (current) use of opiate analgesic: Secondary | ICD-10-CM | POA: Diagnosis not present

## 2018-08-23 DIAGNOSIS — M4608 Spinal enthesopathy, sacral and sacrococcygeal region: Secondary | ICD-10-CM | POA: Diagnosis not present

## 2018-08-23 DIAGNOSIS — G894 Chronic pain syndrome: Secondary | ICD-10-CM | POA: Diagnosis not present

## 2018-08-23 MED FILL — DULOXETINE HCL 60 MG CPEP: 60 | 30 days supply | Qty: 30 | Fill #2

## 2018-08-23 MED FILL — oxyMORphone HCL 5 MG TABS: 5 | 30 days supply | Qty: 180 | Fill #0

## 2018-09-13 DIAGNOSIS — M961 Postlaminectomy syndrome, not elsewhere classified: Secondary | ICD-10-CM | POA: Diagnosis not present

## 2018-09-13 DIAGNOSIS — M4608 Spinal enthesopathy, sacral and sacrococcygeal region: Secondary | ICD-10-CM | POA: Diagnosis not present

## 2018-09-13 DIAGNOSIS — G894 Chronic pain syndrome: Secondary | ICD-10-CM | POA: Diagnosis not present

## 2018-09-13 DIAGNOSIS — Z79891 Long term (current) use of opiate analgesic: Secondary | ICD-10-CM | POA: Diagnosis not present

## 2018-09-13 MED FILL — oxyCODONE HCL 10 MG TABS: 10 | 30 days supply | Qty: 180 | Fill #0

## 2018-09-27 MED FILL — MONTELUKAST SOD 10 MG TAB: 10 | 90 days supply | Qty: 90 | Fill #0

## 2018-09-27 MED FILL — METHOCARBAMOL 750 MG TABS: 750 | 30 days supply | Qty: 120 | Fill #0

## 2018-10-02 DIAGNOSIS — K219 Gastro-esophageal reflux disease without esophagitis: Secondary | ICD-10-CM | POA: Diagnosis not present

## 2018-10-02 DIAGNOSIS — Z1331 Encounter for screening for depression: Secondary | ICD-10-CM | POA: Diagnosis not present

## 2018-10-02 DIAGNOSIS — Z125 Encounter for screening for malignant neoplasm of prostate: Secondary | ICD-10-CM | POA: Diagnosis not present

## 2018-10-02 DIAGNOSIS — E781 Pure hyperglyceridemia: Secondary | ICD-10-CM | POA: Diagnosis not present

## 2018-10-02 DIAGNOSIS — Z0001 Encounter for general adult medical examination with abnormal findings: Secondary | ICD-10-CM | POA: Diagnosis not present

## 2018-10-02 DIAGNOSIS — I10 Essential (primary) hypertension: Secondary | ICD-10-CM | POA: Diagnosis not present

## 2018-10-02 DIAGNOSIS — Z6839 Body mass index (BMI) 39.0-39.9, adult: Secondary | ICD-10-CM | POA: Diagnosis not present

## 2018-10-02 MED FILL — VASCEPA 1 GM CAPSULE: 1 | 30 days supply | Qty: 120 | Fill #0

## 2018-10-02 MED FILL — METOPROLOL SUCCINATE ER 100: 100 | 30 days supply | Qty: 30 | Fill #0

## 2018-10-02 MED FILL — LISINOPRIL-HCTZ 20-12.5 MG: 20-12.5 | 30 days supply | Qty: 30 | Fill #0

## 2018-10-02 MED FILL — DOXYCYCLINE HYCLATE 100 MG: 100 | 7 days supply | Qty: 14 | Fill #0

## 2018-10-02 MED FILL — OMEPRAZOLE 20 MG CPDR: 20 | 90 days supply | Qty: 90 | Fill #0

## 2018-10-04 DIAGNOSIS — Z6838 Body mass index (BMI) 38.0-38.9, adult: Secondary | ICD-10-CM | POA: Diagnosis not present

## 2018-10-04 DIAGNOSIS — Z113 Encounter for screening for infections with a predominantly sexual mode of transmission: Secondary | ICD-10-CM | POA: Diagnosis not present

## 2018-10-11 MED FILL — oxyCODONE HCL 10 MG TABS: 10 | 5 days supply | Qty: 30 | Fill #0

## 2018-10-17 DIAGNOSIS — G894 Chronic pain syndrome: Secondary | ICD-10-CM | POA: Diagnosis not present

## 2018-10-17 DIAGNOSIS — M961 Postlaminectomy syndrome, not elsewhere classified: Secondary | ICD-10-CM | POA: Diagnosis not present

## 2018-10-17 DIAGNOSIS — Z79891 Long term (current) use of opiate analgesic: Secondary | ICD-10-CM | POA: Diagnosis not present

## 2018-10-17 DIAGNOSIS — M4608 Spinal enthesopathy, sacral and sacrococcygeal region: Secondary | ICD-10-CM | POA: Diagnosis not present

## 2018-10-17 MED FILL — DULOXETINE HCL 60 MG CPEP: 60 | 30 days supply | Qty: 30 | Fill #0

## 2018-10-17 MED FILL — oxyCODONE HCL 10 MG TABS: 10 | 30 days supply | Qty: 180 | Fill #0

## 2018-10-23 DIAGNOSIS — R509 Fever, unspecified: Secondary | ICD-10-CM | POA: Diagnosis not present

## 2018-10-23 DIAGNOSIS — R3129 Other microscopic hematuria: Secondary | ICD-10-CM | POA: Diagnosis not present

## 2018-10-23 DIAGNOSIS — Z6838 Body mass index (BMI) 38.0-38.9, adult: Secondary | ICD-10-CM | POA: Diagnosis not present

## 2018-10-23 DIAGNOSIS — R109 Unspecified abdominal pain: Secondary | ICD-10-CM | POA: Diagnosis not present

## 2018-11-01 DIAGNOSIS — M9902 Segmental and somatic dysfunction of thoracic region: Secondary | ICD-10-CM | POA: Diagnosis not present

## 2018-11-01 DIAGNOSIS — M9903 Segmental and somatic dysfunction of lumbar region: Secondary | ICD-10-CM | POA: Diagnosis not present

## 2018-11-01 DIAGNOSIS — M545 Low back pain: Secondary | ICD-10-CM | POA: Diagnosis not present

## 2018-11-01 DIAGNOSIS — M9905 Segmental and somatic dysfunction of pelvic region: Secondary | ICD-10-CM | POA: Diagnosis not present

## 2018-11-01 MED FILL — FENOFIBRATE 160 MG TABLET: 160 | 90 days supply | Qty: 90 | Fill #0

## 2018-11-01 MED FILL — LISINOPRIL-HCTZ 20-12.5 MG: 20-12.5 | 30 days supply | Qty: 30 | Fill #1

## 2018-11-01 MED FILL — VASCEPA 1 GM CAPSULE: 1 | 30 days supply | Qty: 120 | Fill #1

## 2018-11-01 MED FILL — METOPROLOL SUCCINATE ER 100: 100 | 30 days supply | Qty: 30 | Fill #1

## 2018-11-02 DIAGNOSIS — M9903 Segmental and somatic dysfunction of lumbar region: Secondary | ICD-10-CM | POA: Diagnosis not present

## 2018-11-02 DIAGNOSIS — M545 Low back pain: Secondary | ICD-10-CM | POA: Diagnosis not present

## 2018-11-02 DIAGNOSIS — M9905 Segmental and somatic dysfunction of pelvic region: Secondary | ICD-10-CM | POA: Diagnosis not present

## 2018-11-02 DIAGNOSIS — M9902 Segmental and somatic dysfunction of thoracic region: Secondary | ICD-10-CM | POA: Diagnosis not present

## 2018-11-02 MED FILL — METHOCARBAMOL 750 MG TABS: 750 | 30 days supply | Qty: 120 | Fill #0

## 2018-11-06 DIAGNOSIS — M545 Low back pain: Secondary | ICD-10-CM | POA: Diagnosis not present

## 2018-11-06 DIAGNOSIS — M9905 Segmental and somatic dysfunction of pelvic region: Secondary | ICD-10-CM | POA: Diagnosis not present

## 2018-11-06 DIAGNOSIS — M9902 Segmental and somatic dysfunction of thoracic region: Secondary | ICD-10-CM | POA: Diagnosis not present

## 2018-11-06 DIAGNOSIS — M9903 Segmental and somatic dysfunction of lumbar region: Secondary | ICD-10-CM | POA: Diagnosis not present

## 2018-11-14 MED FILL — oxyCODONE HCL 10 MG TABS: 10 | 30 days supply | Qty: 180 | Fill #0

## 2018-11-15 DIAGNOSIS — N529 Male erectile dysfunction, unspecified: Secondary | ICD-10-CM | POA: Diagnosis not present

## 2018-11-15 DIAGNOSIS — I10 Essential (primary) hypertension: Secondary | ICD-10-CM | POA: Diagnosis not present

## 2018-11-15 DIAGNOSIS — Z720 Tobacco use: Secondary | ICD-10-CM | POA: Diagnosis not present

## 2018-11-15 DIAGNOSIS — N50812 Left testicular pain: Secondary | ICD-10-CM | POA: Diagnosis not present

## 2018-11-28 MED FILL — TESTOSTERONE CYP 200 MG/ML: 200 | 84 days supply | Qty: 6 | Fill #0

## 2018-11-28 MED FILL — DULOXETINE HCL 60 MG CPEP: 60 | 30 days supply | Qty: 30 | Fill #1

## 2018-11-28 MED FILL — VASCEPA 1 GM CAPSULE: 1 | 30 days supply | Qty: 120 | Fill #2

## 2018-11-28 MED FILL — LISINOPRIL-HCTZ 20-12.5 MG: 20-12.5 | 30 days supply | Qty: 30 | Fill #2

## 2018-11-29 DIAGNOSIS — R509 Fever, unspecified: Secondary | ICD-10-CM | POA: Diagnosis not present

## 2018-11-29 DIAGNOSIS — K5903 Drug induced constipation: Secondary | ICD-10-CM | POA: Diagnosis not present

## 2018-11-29 DIAGNOSIS — T402X5A Adverse effect of other opioids, initial encounter: Secondary | ICD-10-CM | POA: Diagnosis not present

## 2018-12-10 DIAGNOSIS — Z6837 Body mass index (BMI) 37.0-37.9, adult: Secondary | ICD-10-CM | POA: Diagnosis not present

## 2018-12-10 DIAGNOSIS — R1012 Left upper quadrant pain: Secondary | ICD-10-CM | POA: Diagnosis not present

## 2018-12-10 DIAGNOSIS — M542 Cervicalgia: Secondary | ICD-10-CM | POA: Diagnosis not present

## 2018-12-12 DIAGNOSIS — M961 Postlaminectomy syndrome, not elsewhere classified: Secondary | ICD-10-CM | POA: Diagnosis not present

## 2018-12-12 DIAGNOSIS — M4608 Spinal enthesopathy, sacral and sacrococcygeal region: Secondary | ICD-10-CM | POA: Diagnosis not present

## 2018-12-12 DIAGNOSIS — G894 Chronic pain syndrome: Secondary | ICD-10-CM | POA: Diagnosis not present

## 2018-12-12 DIAGNOSIS — M62838 Other muscle spasm: Secondary | ICD-10-CM | POA: Diagnosis not present

## 2018-12-12 DIAGNOSIS — K5903 Drug induced constipation: Secondary | ICD-10-CM | POA: Diagnosis not present

## 2018-12-12 DIAGNOSIS — Z79891 Long term (current) use of opiate analgesic: Secondary | ICD-10-CM | POA: Diagnosis not present

## 2018-12-12 MED FILL — oxyCODONE HCL 10 MG TABS: 10 | 30 days supply | Qty: 180 | Fill #0

## 2018-12-12 MED FILL — MOVANTIK 25 MG TABLET: 25 | 30 days supply | Qty: 30 | Fill #0

## 2018-12-12 MED FILL — METHOCARBAMOL 750 MG TABS: 750 | 30 days supply | Qty: 120 | Fill #0

## 2019-01-03 MED FILL — MONTELUKAST SOD 10 MG TAB: 10 | 90 days supply | Qty: 90 | Fill #1

## 2019-01-03 MED FILL — DULOXETINE HCL 60 MG CPEP: 60 | 30 days supply | Qty: 30 | Fill #2

## 2019-01-03 MED FILL — LISINOPRIL-HCTZ 20-12.5 MG: 20-12.5 | 30 days supply | Qty: 30 | Fill #3

## 2019-01-10 DIAGNOSIS — M62838 Other muscle spasm: Secondary | ICD-10-CM | POA: Diagnosis not present

## 2019-01-10 DIAGNOSIS — M961 Postlaminectomy syndrome, not elsewhere classified: Secondary | ICD-10-CM | POA: Diagnosis not present

## 2019-01-10 DIAGNOSIS — M4608 Spinal enthesopathy, sacral and sacrococcygeal region: Secondary | ICD-10-CM | POA: Diagnosis not present

## 2019-01-10 DIAGNOSIS — K5903 Drug induced constipation: Secondary | ICD-10-CM | POA: Diagnosis not present

## 2019-01-10 DIAGNOSIS — G894 Chronic pain syndrome: Secondary | ICD-10-CM | POA: Diagnosis not present

## 2019-01-10 DIAGNOSIS — F4323 Adjustment disorder with mixed anxiety and depressed mood: Secondary | ICD-10-CM | POA: Diagnosis not present

## 2019-01-10 DIAGNOSIS — Z79891 Long term (current) use of opiate analgesic: Secondary | ICD-10-CM | POA: Diagnosis not present

## 2019-01-10 MED FILL — FLUoxetine HCL 20 MG CAPS: 20 | 30 days supply | Qty: 30 | Fill #0

## 2019-01-10 MED FILL — oxyCODONE HCL 10 MG TABS: 10 | 30 days supply | Qty: 180 | Fill #0

## 2019-01-30 MED FILL — LISINOPRIL-HCTZ 20-12.5 MG: 20-12.5 | 30 days supply | Qty: 30 | Fill #4

## 2019-01-30 MED FILL — FENOFIBRATE 160 MG TABLET: 160 | 90 days supply | Qty: 90 | Fill #1

## 2019-01-31 MED FILL — METHOCARBAMOL 750 MG TABS: 750 | 30 days supply | Qty: 120 | Fill #0

## 2019-02-11 DIAGNOSIS — Z6838 Body mass index (BMI) 38.0-38.9, adult: Secondary | ICD-10-CM | POA: Diagnosis not present

## 2019-02-11 DIAGNOSIS — I10 Essential (primary) hypertension: Secondary | ICD-10-CM | POA: Diagnosis not present

## 2019-02-11 DIAGNOSIS — N529 Male erectile dysfunction, unspecified: Secondary | ICD-10-CM | POA: Diagnosis not present

## 2019-02-12 DIAGNOSIS — K5903 Drug induced constipation: Secondary | ICD-10-CM | POA: Diagnosis not present

## 2019-02-12 DIAGNOSIS — M961 Postlaminectomy syndrome, not elsewhere classified: Secondary | ICD-10-CM | POA: Diagnosis not present

## 2019-02-12 DIAGNOSIS — G894 Chronic pain syndrome: Secondary | ICD-10-CM | POA: Diagnosis not present

## 2019-02-12 DIAGNOSIS — Z79891 Long term (current) use of opiate analgesic: Secondary | ICD-10-CM | POA: Diagnosis not present

## 2019-02-12 MED FILL — FLUoxetine HCL 10 MG CAPS: 10 | 30 days supply | Qty: 30 | Fill #0

## 2019-02-12 MED FILL — oxyCODONE HCL 10 MG TABS: 10 | 30 days supply | Qty: 180 | Fill #0

## 2019-02-12 MED FILL — VASCEPA 1 GM CAPSULE: 1 | 30 days supply | Qty: 120 | Fill #3

## 2019-02-26 DIAGNOSIS — M9902 Segmental and somatic dysfunction of thoracic region: Secondary | ICD-10-CM | POA: Diagnosis not present

## 2019-02-26 DIAGNOSIS — M9903 Segmental and somatic dysfunction of lumbar region: Secondary | ICD-10-CM | POA: Diagnosis not present

## 2019-02-26 DIAGNOSIS — M545 Low back pain: Secondary | ICD-10-CM | POA: Diagnosis not present

## 2019-02-26 DIAGNOSIS — M9905 Segmental and somatic dysfunction of pelvic region: Secondary | ICD-10-CM | POA: Diagnosis not present

## 2019-02-28 DIAGNOSIS — M9902 Segmental and somatic dysfunction of thoracic region: Secondary | ICD-10-CM | POA: Diagnosis not present

## 2019-02-28 DIAGNOSIS — M9905 Segmental and somatic dysfunction of pelvic region: Secondary | ICD-10-CM | POA: Diagnosis not present

## 2019-02-28 DIAGNOSIS — M9903 Segmental and somatic dysfunction of lumbar region: Secondary | ICD-10-CM | POA: Diagnosis not present

## 2019-02-28 DIAGNOSIS — M545 Low back pain: Secondary | ICD-10-CM | POA: Diagnosis not present

## 2019-03-11 MED FILL — METHOCARBAMOL 750 MG TABS: 750 | 30 days supply | Qty: 120 | Fill #0

## 2019-03-13 MED FILL — oxyCODONE HCL 10 MG TABS: 10 | 30 days supply | Qty: 180 | Fill #0

## 2019-03-19 DIAGNOSIS — H5213 Myopia, bilateral: Secondary | ICD-10-CM | POA: Diagnosis not present

## 2019-03-19 DIAGNOSIS — H524 Presbyopia: Secondary | ICD-10-CM | POA: Diagnosis not present

## 2019-04-01 MED FILL — LISINOPRIL-HCTZ 20-12.5 MG: 20-12.5 | 30 days supply | Qty: 30 | Fill #5

## 2019-04-01 MED FILL — OMEPRAZOLE 20 MG CAP: 20 | 90 days supply | Qty: 90 | Fill #1

## 2019-04-01 MED FILL — FLUoxetine HCL 10 MG CAPS: 10 | 30 days supply | Qty: 30 | Fill #1

## 2019-04-10 DIAGNOSIS — G894 Chronic pain syndrome: Secondary | ICD-10-CM | POA: Diagnosis not present

## 2019-04-10 DIAGNOSIS — M961 Postlaminectomy syndrome, not elsewhere classified: Secondary | ICD-10-CM | POA: Diagnosis not present

## 2019-04-10 DIAGNOSIS — Z79891 Long term (current) use of opiate analgesic: Secondary | ICD-10-CM | POA: Diagnosis not present

## 2019-04-10 DIAGNOSIS — K5903 Drug induced constipation: Secondary | ICD-10-CM | POA: Diagnosis not present

## 2019-04-10 MED FILL — oxyCODONE HCL 10 MG TABS: 10 | 30 days supply | Qty: 180 | Fill #0

## 2019-04-10 MED FILL — METHOCARBAMOL 750 MG TABS: 750 | 30 days supply | Qty: 120 | Fill #0

## 2019-04-23 MED FILL — MONTELUKAST SOD 10 MG TAB: 10 | 90 days supply | Qty: 90 | Fill #2

## 2019-04-23 MED FILL — VASCEPA 1 GM CAPSULE: 1 | 30 days supply | Qty: 120 | Fill #4

## 2019-04-25 DIAGNOSIS — I10 Essential (primary) hypertension: Secondary | ICD-10-CM | POA: Diagnosis not present

## 2019-05-01 MED FILL — FLUoxetine HCL 10 MG CAPS: 10 | 30 days supply | Qty: 30 | Fill #2

## 2019-05-01 MED FILL — LISINOPRIL-HCTZ 20-12.5 MG: 20-12.5 | 30 days supply | Qty: 30 | Fill #6

## 2019-05-10 MED FILL — oxyCODONE HCL 10 MG TABS: 10 | 30 days supply | Qty: 180 | Fill #0

## 2019-05-10 MED FILL — METHOCARBAMOL 750 MG TABS: 750 | 30 days supply | Qty: 120 | Fill #1

## 2019-05-17 ENCOUNTER — Encounter: Payer: Self-pay | Admitting: Gastroenterology

## 2019-05-20 MED FILL — FENOFIBRATE 160 MG TABLET: 160 | 90 days supply | Qty: 90 | Fill #2

## 2019-05-23 DIAGNOSIS — Z6839 Body mass index (BMI) 39.0-39.9, adult: Secondary | ICD-10-CM | POA: Diagnosis not present

## 2019-05-23 DIAGNOSIS — J309 Allergic rhinitis, unspecified: Secondary | ICD-10-CM | POA: Diagnosis not present

## 2019-05-23 DIAGNOSIS — J329 Chronic sinusitis, unspecified: Secondary | ICD-10-CM | POA: Diagnosis not present

## 2019-05-27 ENCOUNTER — Ambulatory Visit: Payer: 59 | Admitting: Gastroenterology

## 2019-05-27 ENCOUNTER — Other Ambulatory Visit: Payer: Self-pay

## 2019-05-27 ENCOUNTER — Encounter: Payer: Self-pay | Admitting: Gastroenterology

## 2019-05-27 VITALS — BP 118/80 | HR 90 | Temp 98.5°F | Ht 73.0 in | Wt 287.2 lb

## 2019-05-27 DIAGNOSIS — R1032 Left lower quadrant pain: Secondary | ICD-10-CM | POA: Diagnosis not present

## 2019-05-27 DIAGNOSIS — K219 Gastro-esophageal reflux disease without esophagitis: Secondary | ICD-10-CM | POA: Diagnosis not present

## 2019-05-27 DIAGNOSIS — F1190 Opioid use, unspecified, uncomplicated: Secondary | ICD-10-CM

## 2019-05-27 DIAGNOSIS — K5900 Constipation, unspecified: Secondary | ICD-10-CM | POA: Diagnosis not present

## 2019-05-27 MED ORDER — SUPREP BOWEL PREP KIT 17.5-3.13-1.6 GM/177ML PO SOLN: 1.0000 | ORAL | 0 refills | Status: DC

## 2019-05-27 MED FILL — SUPREP BOWEL PREP KIT: 17.5-3.13-1 | 2 days supply | Qty: 354 | Fill #0

## 2019-05-27 NOTE — Patient Instructions (Addendum)
If you are age 48 or older, your body mass index should be between 23-30. Your Body mass index is 37.9 kg/m. If this is out of the aforementioned range listed, please consider follow up with your Primary Care Provider.  If you are age 11 or younger, your body mass index should be between 19-25. Your Body mass index is 37.9 kg/m. If this is out of the aformentioned range listed, please consider follow up with your Primary Care Provider.   Please go to the lab at [facility] ([place] You will need to go to level "B", you do not need an appointment for this. Hours available are 7:30 am - 4:30 pm.   Due to recent COVID-19 restrictions implemented by [employer] and state authorities and in an effort to keep both patients and staff as safe as possible, [facility] requires COVID-19 testing prior to any scheduled endoscopic procedure. The testing center is located at [address] [place], [place] [address] in the [facility] [employer]  suite.  Your appointment has been scheduled for 12pm on 06/11/19.   Please bring your insurance cards to this appointment. You will require your COVID screen 2 business days prior to your endoscopic procedure.  You are not required to quarantine after your screening.  You will only receive a phone call with the results if it is POSITIVE.  If you do not receive a call the day before your procedure you should begin your prep, if ordered, and you should report to the endo center for your procedure at your designated appointment arrival time ( one hour prior to the procedure time). There is no cost to you for the screening on the day of the swab.  [facility] will file with your insurance company for the testing.    You may receive an automated phone call prior to your procedure or have a message in your MyChart that you have an appointment for a BP/15 at the [facility], please  disregard this message.  Your testing will be at the [place] [place] location.   If you are leaving [facility] travel [place] on [address], turn left onto [address], turn night onto [address] at the 1st stop light turn right, pass the [employer] on your right and proceed to [place] (white building).   You have been scheduled for an endoscopy and colonoscopy. Please follow the written instructions given to you at your visit today. Please pick up your prep supplies at the pharmacy within the next 1-3 days. If you use inhalers (even only as needed), please bring them with you on the day of your procedure. Your physician has requested that you go to www.startemmi.com and enter the access code given to you at your visit today. This web site gives a general overview about your procedure. However, you should still follow specific instructions given to you by our office regarding your preparation for the procedure.  We have sent the following medications to your pharmacy for you to pick up at your convenience: Suprep   Fiber Content in Foods  See the following list for the dietary fiber content of some common foods. High-fiber foods High-fiber foods contain 4 grams or more (4g or more) of fiber per serving. They include:  Artichoke (fresh) - 1 medium has 10.3g of fiber.  Baked beans, plain or vegetarian (canned) -  cup  has 5.2g of fiber.  Blackberries or raspberries (fresh) -  cup has 4g of fiber.  Bran cereal -  cup has 8.6g of fiber.  Bulgur (cooked) -  cup has 4g of fiber.  Kidney beans (canned) -  cup has 6.8g of fiber.  Lentils (cooked) -  cup has 7.8g of fiber.  Pear (fresh) - 1 medium has 5.1g of fiber.  Peas (frozen) -  cup has 4.4g of fiber.  Pinto beans (canned) -  cup has 5.5g of fiber.  Pinto beans (dried and cooked) -  cup has 7.7g of fiber.  Potato with skin  (baked) - 1 medium has 4.4g of fiber.  Quinoa (cooked) -  cup has 5g of fiber.  Soybeans (canned, frozen, or fresh) -  cup has 5.1g of fiber. Moderate-fiber foods Moderate-fiber foods contain 1-4 grams (1-4g) of fiber per serving. They include:  Almonds - 1 oz. has 3.5g of fiber.  Apple with skin - 1 medium has 3.3g of fiber.  Applesauce, sweetened -  cup has 1.5g of fiber.  Bagel, plain - one 4-inch (10-cm) bagel has 2g of fiber.  Banana - 1 medium has 3.1g of fiber.  Broccoli (cooked) -  cup has 2.5g of fiber.  Carrots (cooked) -  cup has 2.3g of fiber.  Corn (canned or frozen) -  cup has 2.1g of fiber.  Corn tortilla - one 6-inch (15-cm) tortilla has 1.5g of fiber.  Green beans (canned) -  cup has 2g of fiber.  Instant oatmeal -  cup has about 2g of fiber.  Long-grain brown rice (cooked) - 1 cup has 3.5g of fiber.  Macaroni, enriched (cooked) - 1 cup has 2.5g of fiber.  Melon - 1 cup has 1.4g of fiber.  Multigrain cereal -  cup has about 2-4g of fiber.  Orange - 1 small has 3.1g of fiber.  Potatoes, mashed -  cup has 1.6g of fiber.  Raisins - 1/4 cup has 1.6g of fiber.  Squash -  cup has 2.9g of fiber.  Sunflower seeds -  cup has 1.1g of fiber.  Tomato - 1 medium has 1.5g of fiber.  Vegetable or soy patty - 1 has 3.4g of fiber.  Whole-wheat bread - 1 slice has 2g of fiber.  Whole-wheat spaghetti -  cup has 3.2g of fiber. Low-fiber foods Low-fiber foods contain less than 1 gram (less than 1g) of fiber per serving. They include:  Egg - 1 large.  Flour tortilla - one 6-inch (15-cm) tortilla.  Fruit juice -  cup.  Lettuce - 1 cup.  Meat, poultry, or fish - 1 oz.  Milk - 1 cup.  Spinach (raw) - 1 cup.  White bread - 1 slice.  White rice -  cup.  Yogurt -  cup. Actual amounts of fiber in foods may be different depending on processing. Talk with your dietitian about how much fiber you need in your diet. This information is not  intended to replace advice given to you by your health care provider. Make sure you discuss any questions you have with your health care provider. Document Released: 12/11/2006 Document Revised: 03/17/2016 Document Reviewed: 09/17/2015 Elsevier Patient Education  [employer].   It was a pleasure to see you today!  Vito Cirigliano, D.O.

## 2019-05-27 NOTE — Progress Notes (Signed)
Chief Complaint: Constipation, abdominal pain, reflux  Referring Provider:     Mateo Flow, MD   HPI:    [name] is a 48 y.o. male with a history of hypertension, hyperlipidemia, depression, anxiety, OSA (CPAP), obesity (BMI 37.9), tobacco use d/o,  idiopathic thrombocytopenia, referred to the Gastroenterology Clinic for evaluation of constipation, LLQ pain, nausea.    He states he has constipation since 11/2018 with LLQ pain, which can radiate to left leg described as "pulling sensation in left leg". Constipation described as straining, hard stools, and decreased stool frequency. Trialed med used for OIC (cannot recall name) x1 month, without change.  Has trialed OTC MiraLAX without change.  Worse with red meat, soda. Avoids greasy foods. Baseline was 3-4 soft stools/day. Now 1 hard stool and can also have overflow diarrhea. No hematochezia, melena.  Does report that he drinks 2L of cola/day.  He actually stopped drinking soda x4 months and intentionally lost 100 pounds, then started drinking soda again, and has gained 60 pounds back.    Also reports a hx of reflux for many years. Index sxs of HB, regurgitation, sourbrash. No dysphagia. Did not tolerated Nexium in the past. Takes Prilosec 20 mg qhs, but still with daily breakthrough sxs, treated with baking soda and tums throughout the day.    Takes oxycodone since 2011 for chronic back pain and spine surgery x6.  Spinal cord stim placement 2013, removed 1 week later.  Prior appendectomy, then LOA 10 years later and hernia repair.   No recent labs or imaging studies for review today.  No prior EGD or colonoscopy.  No known family history of CRC, GI malignancy, liver disease, pancreatic disease, or IBD.  Past Medical History:  Diagnosis Date  . Anxiety   . Arthritis    back area  . Blood dyscrasia    thrombocytopenia  15 months  . Chronic back pain   . Complication of anesthesia    noticed some memory loss  after surgery 07/18/12  . Depression    on prozac  . Dysrhythmia    PAF in 2012, felt related to OSA--now on CPAP; PRN follow-up Dr. Lin Givens 03/2011  . GERD (gastroesophageal reflux disease)    uses baking soda  . Hyperlipidemia   . Hypertension    Dr. Allayne Butcher, [facility]  . Pancreatitis    hx of  . Pneumonia    hx of  2005ish  . Sleep apnea    sleep study approx 18 months ago, uses cpap  . Thrombocytopenia, idiopathic (HCC)    age of 85 months     Past Surgical History:  Procedure Laterality Date  . APPENDECTOMY     65 months of age and removed scar tissue  . BACK SURGERY     x2 fusions, L5-S1, L4-L5  . CHOLECYSTECTOMY    . DIAGNOSTIC LAPAROSCOPY     exploratory lab, abdominal  . HERNIA REPAIR Left    inguinal  . REFRACTIVE SURGERY Bilateral 98  . SACROILIAC JOINT FUSION Right 11/21/2013   Procedure: RIGHT SACROILIAC JOINT FUSION;  Surgeon: Sinclair Ship, MD;  Location: [place];  Service: Orthopedics;  Laterality: Right;  Right sided sacroiliac joint fusion  . SACROILIAC JOINT FUSION Left 01/22/2014   Procedure: SACROILIAC JOINT FUSION;  Surgeon: Sinclair Ship, MD;  Location: [place];  Service: Orthopedics;  Laterality: Left;  Left sided sacroiliac joint fusion  .  SPINAL CORD STIMULATOR IMPLANT  07/18/2012  . SPINAL CORD STIMULATOR INSERTION  07/18/2012   Procedure: LUMBAR SPINAL CORD STIMULATOR INSERTION;  Surgeon: Melina Schools, MD;  Location: [place];  Service: Orthopedics;  Laterality: N/A;  trial spinal cord stimulator implant   . SPINAL CORD STIMULATOR REMOVAL  07/26/2012   Procedure: LUMBAR SPINAL CORD STIMULATOR REMOVAL;  Surgeon: Melina Schools, MD;  Location: [place];  Service: Orthopedics;  Laterality: N/A;  REMOVAL OF TRIAL LEAD OR CONVERSION TO PERM SPINAL CORD STIMULATOR IMPLANT  . TONSILLECTOMY    . VASECTOMY     Family History  Problem Relation Age of Onset  . Heart attack Mother   . Heart attack Brother   . Colon  cancer Neg Hx   . Esophageal cancer Neg Hx    Social History   Tobacco Use  . Smoking status: Current Some Day Smoker    Packs/day: 1.00    Years: 18.00    Pack years: 18.00    Types: Cigarettes  . Smokeless tobacco: Never Used  Substance Use Topics  . Alcohol use: No  . Drug use: No   Current Outpatient Medications  Medication Sig Dispense Refill  . acetaminophen (TYLENOL) 500 MG tablet Take 500 mg by mouth as needed (Take 5 to 6 tablets daily).     Marland Kitchen amLODipine (NORVASC) 10 MG tablet Take 10 mg by mouth daily.    Marland Kitchen aspirin 325 MG tablet Take 325 mg by mouth as needed.    . cyanocobalamin (,VITAMIN B-12,) 1000 MCG/ML injection every 14 (fourteen) days.   6  . DEPO-TESTOSTERONE 200 MG/ML injection every 14 (fourteen) days.   2  . fenofibrate 160 MG tablet Take 160 mg by mouth daily.  1  . FLUoxetine (PROZAC) 10 MG capsule Take 10 mg by mouth daily.    Marland Kitchen ibuprofen (ADVIL) 200 MG tablet Take 1,200 mg by mouth daily. Takes 6 tablets daily    . Icosapent Ethyl (VASCEPA) 1 g CAPS Take 2 capsules by mouth 2 (two) times daily.    Marland Kitchen lisinopril (PRINIVIL,ZESTRIL) 20 MG tablet Take 1 tablet (20 mg total) by mouth at bedtime. Take an extra 1/2 = 10 mg in the evening 30 tablet 3  . methocarbamol (ROBAXIN) 750 MG tablet Take 750 mg by mouth 3 (three) times daily as needed.  1  . Oxycodone HCl 10 MG TABS Take 10 mg by mouth every 4 (four) hours as needed (pain).   0   Current Facility-Administered Medications  Medication Dose Route Frequency Provider Last Rate Last Dose  . triamcinolone acetonide (KENALOG) 10 MG/ML injection 10 mg  10 mg Other Once Landis Martins, DPM      . triamcinolone acetonide (KENALOG) 10 MG/ML injection 10 mg  10 mg Other Once Landis Martins, DPM      . triamcinolone acetonide (KENALOG) 10 MG/ML injection 10 mg  10 mg Other Once Landis Martins, DPM       Allergies  Allergen Reactions  . Nexium [Esomeprazole Magnesium] Hives and Swelling  . Wellbutrin [Bupropion]  Hives and Swelling     Review of Systems: All systems reviewed and negative except where noted in HPI.     Physical Exam:    Wt Readings from Last 3 Encounters:  05/27/19 287 lb 4 oz (130.3 kg)  10/12/17 271 lb (122.9 kg)  04/05/17 253 lb (114.8 kg)    BP 118/80   Pulse 90   Temp 98.5 F (36.9 C)   Ht 6\' 1"  (1.854 m)  Wt 287 lb 4 oz (130.3 kg)   BMI 37.90 kg/m  Constitutional:  Pleasant, in no acute distress. Psychiatric: Normal mood and affect. Behavior is normal. EENT: Pupils normal.  Conjunctivae are normal. No scleral icterus. Neck supple. No cervical LAD. Cardiovascular: Normal rate, regular rhythm. No edema Pulmonary/chest: Effort normal and breath sounds normal. No wheezing, rales or rhonchi. Abdominal: Soft, nondistended, nontender. Bowel sounds active throughout. There are no masses palpable. No hepatomegaly. Neurological: Alert and oriented to person place and time. Skin: Skin is warm and dry. No rashes noted.   ASSESSMENT AND PLAN;   1) Constipation 2) LLQ Pain 3) Chronic back pain/chronic opioid requirement -Advised to stop soda and start drinking >64 oz water/day -Increase dietary fiber.  Provided with fiber chart -Okay to resume MiraLAX bid with goal soft, regular stools without straining to have BM -Colonoscopy to evaluate for mucosal/luminal etiology -Discussed involvement of pain medications in chronic constipation.  Holding off on introducing methylnaltrexone pending the above work-up -History of spinal cord stim placement, then removal 6 days later in 2013.  Query neuro etiology.  If unrevealing work-up and not improving with the above, may consider ARM +/- Sitz marker study -Check a CMP, CBC, TSH for baseline  4) GERD -Discussed starting high-dose PPI.  He prefer evaluation first before introducing new/high-dose therapy to his current medication list -EGD to evaluate for LES laxity, hiatal hernia, erosive esophagitis -Ongoing acid suppression  regimen pending endoscopic work-up  The indications, risks, and benefits of EGD and colonoscopy were explained to the patient in detail. Risks include but are not limited to bleeding, perforation, adverse reaction to medications, and cardiopulmonary compromise. Sequelae include but are not limited to the possibility of surgery, hositalization, and mortality. The patient verbalized understanding and wished to proceed. All questions answered, referred to scheduler and bowel prep ordered. Further recommendations pending results of the exam.     Lavena Bullion, DO, FACG  05/27/2019, 2:18 PM   Mateo Flow, MD

## 2019-05-30 ENCOUNTER — Encounter: Payer: Self-pay | Admitting: Gastroenterology

## 2019-06-04 DIAGNOSIS — Z6838 Body mass index (BMI) 38.0-38.9, adult: Secondary | ICD-10-CM | POA: Diagnosis not present

## 2019-06-04 DIAGNOSIS — R0989 Other specified symptoms and signs involving the circulatory and respiratory systems: Secondary | ICD-10-CM | POA: Diagnosis not present

## 2019-06-04 DIAGNOSIS — I10 Essential (primary) hypertension: Secondary | ICD-10-CM | POA: Diagnosis not present

## 2019-06-06 DIAGNOSIS — Z79891 Long term (current) use of opiate analgesic: Secondary | ICD-10-CM | POA: Diagnosis not present

## 2019-06-06 DIAGNOSIS — M961 Postlaminectomy syndrome, not elsewhere classified: Secondary | ICD-10-CM | POA: Diagnosis not present

## 2019-06-06 DIAGNOSIS — K5903 Drug induced constipation: Secondary | ICD-10-CM | POA: Diagnosis not present

## 2019-06-06 DIAGNOSIS — G894 Chronic pain syndrome: Secondary | ICD-10-CM | POA: Diagnosis not present

## 2019-06-06 MED FILL — PREGABALIN 50 MG CAPS: 50 | 30 days supply | Qty: 60 | Fill #0

## 2019-06-07 MED FILL — FLUoxetine HCL 10 MG CAPS: 10 | 30 days supply | Qty: 30 | Fill #0

## 2019-06-07 MED FILL — METHOCARBAMOL 750 MG TABS: 750 | 30 days supply | Qty: 120 | Fill #2

## 2019-06-07 MED FILL — oxyCODONE HCL 10 MG TABS: 10 | 30 days supply | Qty: 180 | Fill #0

## 2019-06-10 ENCOUNTER — Telehealth: Payer: Self-pay | Admitting: Gastroenterology

## 2019-06-10 NOTE — Telephone Encounter (Signed)
Please advise 

## 2019-06-10 NOTE — Telephone Encounter (Signed)
Can use Miralax BID now and resume, and plan to start prep tomorrow (2-day prep) with Dulcolax 10 mg PO BID x2 days (#8, RF0), clears x2 days, increase water to >64 oz/day, and drink bowel prep as prescribed.

## 2019-06-11 ENCOUNTER — Other Ambulatory Visit (INDEPENDENT_AMBULATORY_CARE_PROVIDER_SITE_OTHER): Payer: 59

## 2019-06-11 ENCOUNTER — Other Ambulatory Visit: Payer: Self-pay | Admitting: Gastroenterology

## 2019-06-11 DIAGNOSIS — R1032 Left lower quadrant pain: Secondary | ICD-10-CM | POA: Diagnosis not present

## 2019-06-11 DIAGNOSIS — Z1159 Encounter for screening for other viral diseases: Secondary | ICD-10-CM | POA: Diagnosis not present

## 2019-06-11 DIAGNOSIS — K219 Gastro-esophageal reflux disease without esophagitis: Secondary | ICD-10-CM

## 2019-06-11 DIAGNOSIS — K5900 Constipation, unspecified: Secondary | ICD-10-CM

## 2019-06-11 LAB — CBC WITH DIFFERENTIAL/PLATELET
Basophils Absolute: 0.1 10*3/uL (ref 0.0–0.1)
Basophils Relative: 0.8 % (ref 0.0–3.0)
Eosinophils Absolute: 0.2 10*3/uL (ref 0.0–0.7)
Eosinophils Relative: 1.9 % (ref 0.0–5.0)
HCT: 43.7 % (ref 39.0–52.0)
Hemoglobin: 14.8 g/dL (ref 13.0–17.0)
Lymphocytes Relative: 29.4 % (ref 12.0–46.0)
Lymphs Abs: 3.6 10*3/uL (ref 0.7–4.0)
MCHC: 33.9 g/dL (ref 30.0–36.0)
MCV: 91.5 fl (ref 78.0–100.0)
Monocytes Absolute: 1 10*3/uL (ref 0.1–1.0)
Monocytes Relative: 8.3 % (ref 3.0–12.0)
Neutro Abs: 7.3 10*3/uL (ref 1.4–7.7)
Neutrophils Relative %: 59.6 % (ref 43.0–77.0)
Platelets: 367 10*3/uL (ref 150.0–400.0)
RBC: 4.78 Mil/uL (ref 4.22–5.81)
RDW: 12.9 % (ref 11.5–15.5)
WBC: 12.2 10*3/uL — ABNORMAL HIGH (ref 4.0–10.5)

## 2019-06-11 LAB — COMPREHENSIVE METABOLIC PANEL
ALT: 11 U/L (ref 0–53)
AST: 13 U/L (ref 0–37)
Albumin: 4.7 g/dL (ref 3.5–5.2)
Alkaline Phosphatase: 79 U/L (ref 39–117)
BUN: 14 mg/dL (ref 6–23)
CO2: 28 mEq/L (ref 19–32)
Calcium: 9.7 mg/dL (ref 8.4–10.5)
Chloride: 104 mEq/L (ref 96–112)
Creatinine, Ser: 0.88 mg/dL (ref 0.40–1.50)
GFR: 92.22 mL/min (ref >60.00)
Glucose, Bld: 107 mg/dL — ABNORMAL HIGH (ref 70–99)
Potassium: 3.9 mEq/L (ref 3.5–5.1)
Sodium: 139 mEq/L (ref 135–145)
Total Bilirubin: 0.4 mg/dL (ref 0.2–1.2)
Total Protein: 7.5 g/dL (ref 6.0–8.3)

## 2019-06-11 LAB — SARS CORONAVIRUS 2 (TAT 6-24 HRS): SARS Coronavirus 2: NEGATIVE

## 2019-06-11 LAB — TSH: TSH: 1.19 u[IU]/mL (ref 0.35–4.50)

## 2019-06-11 NOTE — Telephone Encounter (Signed)
I have called and spoke to patient and have given him these instructions. Patient voiced understanding.

## 2019-06-13 ENCOUNTER — Ambulatory Visit (AMBULATORY_SURGERY_CENTER): Payer: 59 | Admitting: Gastroenterology

## 2019-06-13 ENCOUNTER — Other Ambulatory Visit: Payer: Self-pay

## 2019-06-13 ENCOUNTER — Other Ambulatory Visit: Payer: Self-pay | Admitting: Gastroenterology

## 2019-06-13 ENCOUNTER — Encounter: Payer: Self-pay | Admitting: Gastroenterology

## 2019-06-13 VITALS — BP 108/76 | HR 78 | Temp 98.8°F | Resp 16 | Ht 73.0 in | Wt 287.0 lb

## 2019-06-13 DIAGNOSIS — K2980 Duodenitis without bleeding: Secondary | ICD-10-CM

## 2019-06-13 DIAGNOSIS — D127 Benign neoplasm of rectosigmoid junction: Secondary | ICD-10-CM | POA: Diagnosis not present

## 2019-06-13 DIAGNOSIS — R109 Unspecified abdominal pain: Secondary | ICD-10-CM | POA: Diagnosis not present

## 2019-06-13 DIAGNOSIS — Z1211 Encounter for screening for malignant neoplasm of colon: Secondary | ICD-10-CM | POA: Diagnosis not present

## 2019-06-13 DIAGNOSIS — K5900 Constipation, unspecified: Secondary | ICD-10-CM | POA: Diagnosis not present

## 2019-06-13 DIAGNOSIS — K259 Gastric ulcer, unspecified as acute or chronic, without hemorrhage or perforation: Secondary | ICD-10-CM

## 2019-06-13 DIAGNOSIS — D128 Benign neoplasm of rectum: Secondary | ICD-10-CM

## 2019-06-13 DIAGNOSIS — D125 Benign neoplasm of sigmoid colon: Secondary | ICD-10-CM | POA: Diagnosis not present

## 2019-06-13 DIAGNOSIS — K3189 Other diseases of stomach and duodenum: Secondary | ICD-10-CM

## 2019-06-13 DIAGNOSIS — K640 First degree hemorrhoids: Secondary | ICD-10-CM | POA: Diagnosis not present

## 2019-06-13 DIAGNOSIS — K2100 Gastro-esophageal reflux disease with esophagitis, without bleeding: Secondary | ICD-10-CM | POA: Diagnosis not present

## 2019-06-13 DIAGNOSIS — K2970 Gastritis, unspecified, without bleeding: Secondary | ICD-10-CM | POA: Diagnosis not present

## 2019-06-13 DIAGNOSIS — D729 Disorder of white blood cells, unspecified: Secondary | ICD-10-CM

## 2019-06-13 MED ORDER — FAMOTIDINE 20 MG PO TABS: 20.0000 mg | ORAL_TABLET | Freq: Two times a day (BID) | ORAL | 3 refills | Status: DC

## 2019-06-13 MED ORDER — SODIUM CHLORIDE 0.9 % IV SOLN: 500.0000 mL | INTRAVENOUS | Status: DC

## 2019-06-13 MED FILL — FAMOTIDINE 20 MG TABS: 20 | 30 days supply | Qty: 60 | Fill #0

## 2019-06-13 NOTE — Progress Notes (Signed)
A and O x3. Report to RN. Tolerated MAC anesthesia well.Teeth unchanged after procedure.

## 2019-06-13 NOTE — Op Note (Signed)
[place] Patient Name: [name] Procedure Date: 06/13/2019 1:59 PM MRN: [id number] Endoscopist: Gerrit Heck , MD Age: 48 Referring MD:  Date of Birth: 1971/07/10 Gender: Male Account #: [id number] Procedure:                Upper GI endoscopy Indications:              Heartburn, Suspected esophageal reflux Medicines:                Monitored Anesthesia Care Procedure:                Pre-Anesthesia Assessment:                           - Prior to the procedure, a History and Physical                            was performed, and patient medications and                            allergies were reviewed. The patient's tolerance of                            previous anesthesia was also reviewed. The risks                            and benefits of the procedure and the sedation                            options and risks were discussed with the patient.                            All questions were answered, and informed consent                            was obtained. Prior Anticoagulants: The patient has                            taken no previous anticoagulant or antiplatelet                            agents. ASA Grade Assessment: III - A patient with                            severe systemic disease. After reviewing the risks                            and benefits, the patient was deemed in                            satisfactory condition to undergo the procedure.                           After obtaining informed consent, the endoscope was  passed under direct vision. Throughout the                            procedure, the patient's blood pressure, pulse, and                            oxygen saturations were monitored continuously. The                            Endoscope was introduced through the mouth, and                            advanced to the second part of duodenum. The upper                            GI endoscopy  was accomplished without difficulty.                            The patient tolerated the procedure well. Scope In: Scope Out: Findings:                 LA Grade B (one or more mucosal breaks greater than                            5 mm, not extending between the tops of two mucosal                            folds) esophagitis with no bleeding was found 42 cm                            from the incisors.                           The upper third of the esophagus and middle third                            of the esophagus were normal.                           Scattered moderate inflammation characterized by                            congestion (edema), erosions and erythema was found                            in the gastric fundus, in the gastric body, at the                            incisura and in the gastric antrum. Biopsies were                            taken with a cold forceps for Helicobacter pylori  testing. Estimated blood loss was minimal.                           Localized mildly erythematous mucosa without active                            bleeding and with no stigmata of bleeding was found                            in the duodenal bulb. Biopsies were taken with a                            cold forceps for histology. Estimated blood loss                            was minimal.                           The second portion of the duodenum was normal. Complications:            No immediate complications. Estimated Blood Loss:     Estimated blood loss was minimal. Impression:               - LA Grade B reflux esophagitis with no bleeding.                           - Normal upper third of esophagus and middle third                            of esophagus.                           - Gastritis. Biopsied.                           - Erythematous duodenopathy. Biopsied.                           - Normal second portion of the  duodenum. Recommendation:           - Patient has a contact number available for                            emergencies. The signs and symptoms of potential                            delayed complications were discussed with the                            patient. Return to normal activities tomorrow.                            Written discharge instructions were provided to the                            patient.                           -  Resume previous diet.                           - Continue present medications.                           - Await pathology results.                           - Start Protonix (pantoprazole) 40 mg PO BID for 8                            weeks to promote mucosal healing, then reduce to 40                            mg/day for ongoing control of reflux symptoms. Will                            plan to titrate to the lowest effective dose.                           - Repeat upper endoscopy in 6-8 weeks to check                            healing.                           - Return to GI clinic at appointment to be                            scheduled. Gerrit Heck, MD 06/13/2019 2:44:47 PM

## 2019-06-13 NOTE — Progress Notes (Signed)
Pt states he is unsure of his schedule; will call back to schedule next EGD (for 6-8 weeks to check for healing).  When I was putting in his Protonix prescription, allergy/contraindacation note came up d/t pt being allergic to Nexium.  I spoke to Dr. Bryan Lemma about this and order changed to Pepcid 20 mg po BID for 8 weeks.  RX sent into pt's pharmacy

## 2019-06-13 NOTE — Progress Notes (Signed)
Temp JB  V/S [place] I have reviewed the patient's medical history in detail and updated the computerized patient record.

## 2019-06-13 NOTE — Op Note (Signed)
[facility] Patient Name: [name] Procedure Date: 06/13/2019 1:59 PM MRN: [id number] Endoscopist: Gerrit Heck , MD Age: 48 Referring MD:  Date of Birth: 28-Mar-1971 Gender: Male Account #: [id number] Procedure:                Colonoscopy Indications:              Abdominal pain in the left lower quadrant,                            Constipation Medicines:                Monitored Anesthesia Care Procedure:                Pre-Anesthesia Assessment:                           - Prior to the procedure, a History and Physical                            was performed, and patient medications and                            allergies were reviewed. The patient's tolerance of                            previous anesthesia was also reviewed. The risks                            and benefits of the procedure and the sedation                            options and risks were discussed with the patient.                            All questions were answered, and informed consent                            was obtained. Prior Anticoagulants: The patient has                            taken no previous anticoagulant or antiplatelet                            agents. ASA Grade Assessment: III - A patient with                            severe systemic disease. After reviewing the risks                            and benefits, the patient was deemed in                            satisfactory condition to undergo the procedure.  After obtaining informed consent, the colonoscope                            was passed under direct vision. Throughout the                            procedure, the patient's blood pressure, pulse, and                            oxygen saturations were monitored continuously. The                            Colonoscope was introduced through the anus and                            advanced to the the cecum, identified by                             appendiceal orifice and ileocecal valve. The                            colonoscopy was performed without difficulty. The                            patient tolerated the procedure well. The quality                            of the bowel preparation was fair. The ileocecal                            valve, appendiceal orifice, and rectum were                            photographed. Scope In: 2:21:15 PM Scope Out: 2:37:45 PM Scope Withdrawal Time: 0 hours 12 minutes 31 seconds  Total Procedure Duration: 0 hours 16 minutes 30 seconds  Findings:                 The perianal and digital rectal examinations were                            normal.                           Two sessile polyps were found in the rectum and                            sigmoid colon. The polyps were 6 to 8 mm in size.                            These polyps were removed with a cold snare.                            Resection and retrieval were complete. Estimated  blood loss was minimal.                           Non-bleeding internal hemorrhoids were found during                            retroflexion. The hemorrhoids were small.                           A moderate amount of semi-liquid stool was found in                            the ascending colon and in the cecum, interfering                            with visualization. Lavage of the area was                            performed using copious amounts of sterile water,                            resulting in clearance with fair visualization.                            Cannot rule out the presence of small or flat                            polyps in these areas. Complications:            No immediate complications. Estimated Blood Loss:     Estimated blood loss was minimal. Impression:               - Preparation of the colon was fair.                           - Two 6 to 8 mm polyps in the rectum and in the                             sigmoid colon, removed with a cold snare. Resected                            and retrieved.                           - Non-bleeding internal hemorrhoids.                           - Stool in the ascending colon and in the cecum. Recommendation:           - Patient has a contact number available for                            emergencies. The signs and symptoms of potential  delayed complications were discussed with the                            patient. Return to normal activities tomorrow.                            Written discharge instructions were provided to the                            patient.                           - Resume previous diet.                           - Continue present medications.                           - Await pathology results.                           - Repeat colonoscopy in 2 years because the bowel                            preparation was suboptimal and for surveillance.                           - Return to GI clinic at appointment to be                            scheduled.                           - Use fiber, for example Citrucel, Fibercon, Konsyl                            or Metamucil.                           - Internal hemorrhoids were noted on this study and                            may be amenable to hemorrhoid band ligation. If you                            are interested in further treatment of these                            hemorrhoids with band ligation, please contact my                            clinic to set up an appointment for evaluation and                            treatment.                           -  Query neurologic component to chronic                            constipation. Will continue evaluation/work-up in                            the GI Clinic. If no improvement in symptoms, plan                            for Sitz marker study +/- Anorectal  manometry. Gerrit Heck, MD 06/13/2019 2:49:26 PM

## 2019-06-13 NOTE — Patient Instructions (Signed)
YOU HAD AN ENDOSCOPIC PROCEDURE TODAY AT THE [facility]:   Refer to the procedure report that was given to you for any specific questions about what was found during the examination.  If the procedure report does not answer your questions, please call your gastroenterologist to clarify.  If you requested that your care partner not be given the details of your procedure findings, then the procedure report has been included in a sealed envelope for you to review at your convenience later.  YOU SHOULD EXPECT: Some feelings of bloating in the abdomen. Passage of more gas than usual.  Walking can help get rid of the air that was put into your GI tract during the procedure and reduce the bloating. If you had a lower endoscopy (such as a colonoscopy or flexible sigmoidoscopy) you may notice spotting of blood in your stool or on the toilet paper. If you underwent a bowel prep for your procedure, you may not have a normal bowel movement for a few days.  Please Note:  You might notice some irritation and congestion in your nose or some drainage.  This is from the oxygen used during your procedure.  There is no need for concern and it should clear up in a day or so.  SYMPTOMS TO REPORT IMMEDIATELY:   Following lower endoscopy (colonoscopy or flexible sigmoidoscopy):  Excessive amounts of blood in the stool  Significant tenderness or worsening of abdominal pains  Swelling of the abdomen that is new, acute  Fever of 100F or higher   Following upper endoscopy (EGD)  Vomiting of blood or coffee ground material  New chest pain or pain under the shoulder blades  Painful or persistently difficult swallowing  New shortness of breath  Fever of 100F or higher  Black, tarry-looking stools  For urgent or emergent issues, a gastroenterologist can be reached at any hour by calling [phone].   DIET:  We do recommend a small meal at first, but then you may proceed to your regular diet.  Drink  plenty of fluids but you should avoid alcoholic beverages for 24 hours.  ACTIVITY:  You should plan to take it easy for the rest of today and you should NOT DRIVE or use heavy machinery until tomorrow (because of the sedation medicines used during the test).    FOLLOW UP: Our staff will call the number listed on your records 48-72 hours following your procedure to check on you and address any questions or concerns that you may have regarding the information given to you following your procedure. If we do not reach you, we will leave a message.  We will attempt to reach you two times.  During this call, we will ask if you have developed any symptoms of COVID 19. If you develop any symptoms (ie: fever, flu-like symptoms, shortness of breath, cough etc.) before then, please call [phone].  If you test positive for Covid 19 in the 2 weeks post procedure, please call and report this information to us.    If any biopsies were taken you will be contacted by phone or by letter within the next 1-3 weeks.  Please call us at [phone] if you have not heard about the biopsies in 3 weeks.    SIGNATURES/CONFIDENTIALITY: You and/or your care partner have signed paperwork which will be entered into your electronic medical record.  These signatures attest to the fact that that the information above on your After Visit Summary has been reviewed and is   understood.  Full responsibility of the confidentiality of this discharge information lies with you and/or your care-partner.  Await pathology  Please read over handouts about gastritis, polyps, hemorrhoidal banding and hemorrhoids  Continue your normal medications; start Pepcid 20 mg twice daily for 8 weeks- take 30 minutes before breakfast and dinner  Please call back to set up your repeat endoscopy for 6-8 weeks  Use fiber, such as Citrucel, Fibercon, Konsyl or Metamucil- over the counter  Next colonoscopy in 2 years

## 2019-06-13 NOTE — Progress Notes (Signed)
Called to room to assist during endoscopic procedure.  Patient ID and intended procedure confirmed with present staff. Received instructions for my participation in the procedure from the performing physician.  

## 2019-06-14 ENCOUNTER — Telehealth: Payer: Self-pay

## 2019-06-14 NOTE — Telephone Encounter (Signed)
Left message for patient to call back to the office to schedule appt--   4 wk f/u post EGD/colonoscopy-heartburn/suspected esophageal reflux/LLQ abd pain/constipation/Cirigliano/

## 2019-06-14 NOTE — Telephone Encounter (Signed)
At this 4 wk f/u OV patient will need to be scheduled for a repeat EGD 4 wks from f/u appt-check healing

## 2019-06-17 ENCOUNTER — Telehealth: Payer: Self-pay

## 2019-06-17 ENCOUNTER — Telehealth: Payer: Self-pay | Admitting: *Deleted

## 2019-06-17 NOTE — Telephone Encounter (Signed)
Second post procedure follow up call left message.

## 2019-06-17 NOTE — Telephone Encounter (Signed)
Left message for patient to call back to the office to scheduled f/u OV;

## 2019-06-17 NOTE — Telephone Encounter (Signed)
No answer, message left for the patient. 

## 2019-06-18 NOTE — Telephone Encounter (Addendum)
Called and spoke with patient-patient reports he will call back to the office when he is ready to schedule his f/u OV-patient reports he will call in about a week-Patient advised to call back to the office at [phone] should questions/concerns arise;  Patient verbalized understanding of information/instructions;

## 2019-06-20 ENCOUNTER — Encounter: Payer: Self-pay | Admitting: Gastroenterology

## 2019-06-24 ENCOUNTER — Telehealth: Payer: Self-pay | Admitting: Gastroenterology

## 2019-06-24 NOTE — Telephone Encounter (Signed)
Attempted to reach patient-unable to leave a VM-will attempt to reach patient at a later date/time; 

## 2019-06-24 NOTE — Telephone Encounter (Signed)
Patient is asking if the biopsy results are back yet- he did not know if Dr. Bryan Lemma was going to prescribe any medication for the bacteria that was found or if Dr. Bryan Lemma was waiting for the biopsy results.

## 2019-06-25 NOTE — Telephone Encounter (Signed)
Called and spoke with patient-patient informed of result note and MD recommendations; patient is agreeable with plan of care and recalls have been placed for EGD and colonoscopy; Patient verbalized understanding of information/instructions;  Patient was advised to call the office at [phone] if questions/concerns arise;

## 2019-06-28 ENCOUNTER — Encounter: Payer: 59 | Admitting: Gastroenterology

## 2019-07-08 MED FILL — FLUoxetine HCL 10 MG CAPS: 10 | 30 days supply | Qty: 30 | Fill #1

## 2019-07-08 MED FILL — METHOCARBAMOL 750 MG TABS: 750 | 30 days supply | Qty: 120 | Fill #0

## 2019-07-08 MED FILL — VASCEPA 1 GM CAPSULE: 1 | 30 days supply | Qty: 120 | Fill #5

## 2019-07-08 MED FILL — OMEPRAZOLE 20 MG CAP: 20 | 90 days supply | Qty: 90 | Fill #2

## 2019-07-08 MED FILL — oxyCODONE HCL 10 MG TABS: 10 | 30 days supply | Qty: 180 | Fill #0

## 2019-07-22 MED FILL — MONTELUKAST SOD 10 MG TAB: 10 | 90 days supply | Qty: 90 | Fill #3

## 2019-07-31 ENCOUNTER — Other Ambulatory Visit: Payer: Self-pay

## 2019-07-31 ENCOUNTER — Other Ambulatory Visit (HOSPITAL_COMMUNITY): Payer: Self-pay | Admitting: Anesthesiology

## 2019-07-31 ENCOUNTER — Ambulatory Visit (HOSPITAL_COMMUNITY)
Admission: RE | Admit: 2019-07-31 | Discharge: 2019-07-31 | Disposition: A | Discharge status: Home / Self Care | Payer: 59 | Source: Ambulatory Visit | Attending: Anesthesiology | Admitting: Anesthesiology

## 2019-07-31 DIAGNOSIS — M961 Postlaminectomy syndrome, not elsewhere classified: Secondary | ICD-10-CM | POA: Diagnosis not present

## 2019-07-31 DIAGNOSIS — M5440 Lumbago with sciatica, unspecified side: Secondary | ICD-10-CM | POA: Insufficient documentation

## 2019-07-31 DIAGNOSIS — G894 Chronic pain syndrome: Secondary | ICD-10-CM | POA: Diagnosis not present

## 2019-07-31 DIAGNOSIS — K5903 Drug induced constipation: Secondary | ICD-10-CM | POA: Diagnosis not present

## 2019-07-31 DIAGNOSIS — M48061 Spinal stenosis, lumbar region without neurogenic claudication: Secondary | ICD-10-CM | POA: Diagnosis not present

## 2019-07-31 MED FILL — predniSONE 10 MG TABS: 10 | 30 days supply | Qty: 30 | Fill #0

## 2019-08-05 MED FILL — METHOCARBAMOL 750 MG TABS: 750 | 30 days supply | Qty: 120 | Fill #0

## 2019-08-05 MED FILL — oxyCODONE HCL 10 MG TABS: 10 | 30 days supply | Qty: 180 | Fill #0

## 2019-08-14 DIAGNOSIS — M545 Low back pain: Secondary | ICD-10-CM | POA: Diagnosis not present

## 2019-08-14 DIAGNOSIS — M9905 Segmental and somatic dysfunction of pelvic region: Secondary | ICD-10-CM | POA: Diagnosis not present

## 2019-08-14 DIAGNOSIS — M9903 Segmental and somatic dysfunction of lumbar region: Secondary | ICD-10-CM | POA: Diagnosis not present

## 2019-08-14 DIAGNOSIS — M9902 Segmental and somatic dysfunction of thoracic region: Secondary | ICD-10-CM | POA: Diagnosis not present

## 2019-08-15 DIAGNOSIS — M545 Low back pain: Secondary | ICD-10-CM | POA: Diagnosis not present

## 2019-08-15 DIAGNOSIS — M9903 Segmental and somatic dysfunction of lumbar region: Secondary | ICD-10-CM | POA: Diagnosis not present

## 2019-08-15 DIAGNOSIS — M9902 Segmental and somatic dysfunction of thoracic region: Secondary | ICD-10-CM | POA: Diagnosis not present

## 2019-08-15 DIAGNOSIS — M9905 Segmental and somatic dysfunction of pelvic region: Secondary | ICD-10-CM | POA: Diagnosis not present

## 2019-08-19 MED FILL — FENOFIBRATE 160 MG TABLET: 160 | 90 days supply | Qty: 90 | Fill #3

## 2019-08-19 MED FILL — FLUoxetine HCL 10 MG CAPS: 10 | 30 days supply | Qty: 30 | Fill #2

## 2019-08-26 DIAGNOSIS — M79604 Pain in right leg: Secondary | ICD-10-CM | POA: Diagnosis not present

## 2019-08-26 DIAGNOSIS — M546 Pain in thoracic spine: Secondary | ICD-10-CM | POA: Diagnosis not present

## 2019-08-26 DIAGNOSIS — M545 Low back pain: Secondary | ICD-10-CM | POA: Diagnosis not present

## 2019-08-28 DIAGNOSIS — G894 Chronic pain syndrome: Secondary | ICD-10-CM | POA: Diagnosis not present

## 2019-08-28 DIAGNOSIS — M5440 Lumbago with sciatica, unspecified side: Secondary | ICD-10-CM | POA: Diagnosis not present

## 2019-08-28 DIAGNOSIS — M961 Postlaminectomy syndrome, not elsewhere classified: Secondary | ICD-10-CM | POA: Diagnosis not present

## 2019-08-28 DIAGNOSIS — K5903 Drug induced constipation: Secondary | ICD-10-CM | POA: Diagnosis not present

## 2019-08-28 MED FILL — MOVANTIK 25 MG TABS: 25 | 30 days supply | Qty: 30 | Fill #0

## 2019-08-28 MED FILL — BACLOFEN 10 MG TABS: 10 | 30 days supply | Qty: 90 | Fill #0

## 2019-08-28 MED FILL — VASCEPA 1 GM CAPSULE: 1 | 30 days supply | Qty: 120 | Fill #6

## 2019-08-29 ENCOUNTER — Telehealth: Payer: Self-pay | Admitting: Nurse Practitioner

## 2019-08-29 ENCOUNTER — Other Ambulatory Visit: Payer: Self-pay | Admitting: Orthopedic Surgery

## 2019-08-29 DIAGNOSIS — G8929 Other chronic pain: Secondary | ICD-10-CM

## 2019-08-29 NOTE — Telephone Encounter (Signed)
Phone call to patient to verify medication list and allergies for myelogram procedure. Pt instructed to hold Prozac for 48hrs prior to myelogram appointment time. Pt verbalized understanding. Pre and post procedure instructions reviewed with pt. 

## 2019-09-02 MED FILL — oxyCODONE HCL 10 MG TABS: 10 | 30 days supply | Qty: 180 | Fill #0

## 2019-09-09 ENCOUNTER — Ambulatory Visit
Admission: RE | Admit: 2019-09-09 | Discharge: 2019-09-09 | Disposition: A | Discharge status: Home / Self Care | Payer: 59 | Source: Ambulatory Visit | Attending: Orthopedic Surgery | Admitting: Orthopedic Surgery

## 2019-09-09 DIAGNOSIS — M545 Low back pain, unspecified: Secondary | ICD-10-CM

## 2019-09-09 DIAGNOSIS — G8929 Other chronic pain: Secondary | ICD-10-CM

## 2019-09-09 DIAGNOSIS — M5136 Other intervertebral disc degeneration, lumbar region: Secondary | ICD-10-CM | POA: Diagnosis not present

## 2019-09-09 MED ORDER — DIAZEPAM 5 MG PO TABS
10.0000 mg | ORAL_TABLET | Freq: Once | ORAL | Status: AC
  Administered 2019-09-09: 10 mg via ORAL

## 2019-09-09 MED ORDER — IOPAMIDOL (ISOVUE-M 300) INJECTION 61%
10.0000 mL | Freq: Once | INTRAMUSCULAR | Status: AC | PRN
  Administered 2019-09-09: 10 mL via INTRATHECAL

## 2019-09-09 NOTE — Progress Notes (Signed)
Pt reports he has been off of his Fluoxetine for at least 48 hours.

## 2019-09-09 NOTE — Discharge Instructions (Signed)
Myelogram Discharge Instructions  1. Go home and rest quietly for the next 24 hours.  It is important to lie flat for the next 24 hours.  Get up only to go to the restroom.  You may lie in the bed or on a couch on your back, your stomach, your left side or your right side.  You may have one pillow under your head.  You may have pillows between your knees while you are on your side or under your knees while you are on your back.  2. DO NOT drive today.  Recline the seat as far back as it will go, while still wearing your seat belt, on the way home.  3. You may get up to go to the bathroom as needed.  You may sit up for 10 minutes to eat.  You may resume your normal diet and medications unless otherwise indicated.  Drink plenty of extra fluids today and tomorrow.  4. The incidence of a spinal headache with nausea and/or vomiting is about 5% (one in 20 patients).  If you develop a headache, lie flat and drink plenty of fluids until the headache goes away.  Caffeinated beverages may be helpful.  If you develop severe nausea and vomiting or a headache that does not go away with flat bed rest, call [phone].  5. You may resume normal activities after your 24 hours of bed rest is over; however, do not exert yourself strongly or do any heavy lifting tomorrow.  6. Call your physician for a follow-up appointment.    You may resume Fluoxetine on Tuesday, September 10, 2019 after 1030a.m.

## 2019-09-13 DIAGNOSIS — M545 Low back pain: Secondary | ICD-10-CM | POA: Diagnosis not present

## 2019-09-17 ENCOUNTER — Other Ambulatory Visit: Payer: Self-pay | Admitting: Orthopedic Surgery

## 2019-09-30 ENCOUNTER — Other Ambulatory Visit: Payer: Self-pay

## 2019-09-30 ENCOUNTER — Encounter (HOSPITAL_COMMUNITY)
Admission: RE | Admit: 2019-09-30 | Discharge: 2019-09-30 | Disposition: A | Discharge status: Home / Self Care | Payer: 59 | Source: Ambulatory Visit | Attending: Orthopedic Surgery | Admitting: Orthopedic Surgery

## 2019-09-30 ENCOUNTER — Other Ambulatory Visit (HOSPITAL_COMMUNITY)
Admission: RE | Admit: 2019-09-30 | Discharge: 2019-09-30 | Disposition: A | Discharge status: Home / Self Care | Payer: 59 | Source: Ambulatory Visit | Attending: Orthopedic Surgery | Admitting: Orthopedic Surgery

## 2019-09-30 ENCOUNTER — Encounter (HOSPITAL_COMMUNITY): Payer: Self-pay

## 2019-09-30 DIAGNOSIS — F329 Major depressive disorder, single episode, unspecified: Secondary | ICD-10-CM | POA: Diagnosis not present

## 2019-09-30 DIAGNOSIS — K5903 Drug induced constipation: Secondary | ICD-10-CM | POA: Diagnosis not present

## 2019-09-30 DIAGNOSIS — G8929 Other chronic pain: Secondary | ICD-10-CM | POA: Diagnosis not present

## 2019-09-30 DIAGNOSIS — Z01812 Encounter for preprocedural laboratory examination: Secondary | ICD-10-CM | POA: Insufficient documentation

## 2019-09-30 DIAGNOSIS — Z20822 Contact with and (suspected) exposure to covid-19: Secondary | ICD-10-CM | POA: Diagnosis not present

## 2019-09-30 DIAGNOSIS — F419 Anxiety disorder, unspecified: Secondary | ICD-10-CM | POA: Diagnosis not present

## 2019-09-30 DIAGNOSIS — F17210 Nicotine dependence, cigarettes, uncomplicated: Secondary | ICD-10-CM | POA: Diagnosis not present

## 2019-09-30 DIAGNOSIS — M545 Low back pain: Secondary | ICD-10-CM | POA: Diagnosis not present

## 2019-09-30 DIAGNOSIS — I10 Essential (primary) hypertension: Secondary | ICD-10-CM | POA: Diagnosis not present

## 2019-09-30 DIAGNOSIS — G8918 Other acute postprocedural pain: Secondary | ICD-10-CM | POA: Diagnosis not present

## 2019-09-30 DIAGNOSIS — M961 Postlaminectomy syndrome, not elsewhere classified: Secondary | ICD-10-CM | POA: Diagnosis not present

## 2019-09-30 DIAGNOSIS — G4730 Sleep apnea, unspecified: Secondary | ICD-10-CM | POA: Diagnosis not present

## 2019-09-30 DIAGNOSIS — Z01810 Encounter for preprocedural cardiovascular examination: Secondary | ICD-10-CM | POA: Insufficient documentation

## 2019-09-30 DIAGNOSIS — Z791 Long term (current) use of non-steroidal anti-inflammatories (NSAID): Secondary | ICD-10-CM | POA: Diagnosis not present

## 2019-09-30 DIAGNOSIS — M5440 Lumbago with sciatica, unspecified side: Secondary | ICD-10-CM | POA: Diagnosis not present

## 2019-09-30 LAB — TYPE AND SCREEN
ABO/RH(D): B NEG
Antibody Screen: NEGATIVE

## 2019-09-30 LAB — CBC WITH DIFFERENTIAL/PLATELET
Abs Immature Granulocytes: 0.12 10*3/uL — ABNORMAL HIGH (ref 0.00–0.07)
Basophils Absolute: 0.1 10*3/uL (ref 0.0–0.1)
Basophils Relative: 1 %
Eosinophils Absolute: 0.3 10*3/uL (ref 0.0–0.5)
Eosinophils Relative: 3 %
HCT: 45.3 % (ref 39.0–52.0)
Hemoglobin: 15.5 g/dL (ref 13.0–17.0)
Immature Granulocytes: 1 %
Lymphocytes Relative: 32 %
Lymphs Abs: 3.9 10*3/uL (ref 0.7–4.0)
MCH: 30.9 pg (ref 26.0–34.0)
MCHC: 34.2 g/dL (ref 30.0–36.0)
MCV: 90.4 fL (ref 80.0–100.0)
Monocytes Absolute: 0.8 10*3/uL (ref 0.1–1.0)
Monocytes Relative: 7 %
Neutro Abs: 6.9 10*3/uL (ref 1.7–7.7)
Neutrophils Relative %: 56 %
Platelets: 353 10*3/uL (ref 150–400)
RBC: 5.01 MIL/uL (ref 4.22–5.81)
RDW: 12.4 % (ref 11.5–15.5)
WBC: 12.1 10*3/uL — ABNORMAL HIGH (ref 4.0–10.5)
nRBC: 0 % (ref 0.0–0.2)

## 2019-09-30 LAB — URINALYSIS, ROUTINE W REFLEX MICROSCOPIC
Bacteria, UA: NONE SEEN
Bilirubin Urine: NEGATIVE
Glucose, UA: NEGATIVE mg/dL
Ketones, ur: NEGATIVE mg/dL
Leukocytes,Ua: NEGATIVE
Nitrite: NEGATIVE
Protein, ur: NEGATIVE mg/dL
Specific Gravity, Urine: 1.013 (ref 1.005–1.030)
pH: 8 (ref 5.0–8.0)

## 2019-09-30 LAB — COMPREHENSIVE METABOLIC PANEL
ALT: 17 U/L (ref 0–44)
AST: 20 U/L (ref 15–41)
Albumin: 4.2 g/dL (ref 3.5–5.0)
Alkaline Phosphatase: 80 U/L (ref 38–126)
Anion gap: 13 (ref 5–15)
BUN: 11 mg/dL (ref 6–20)
CO2: 24 mmol/L (ref 22–32)
Calcium: 9.6 mg/dL (ref 8.9–10.3)
Chloride: 102 mmol/L (ref 98–111)
Creatinine, Ser: 0.88 mg/dL (ref 0.61–1.24)
GFR calc Af Amer: >60 mL/min (ref >60)
GFR calc non Af Amer: >60 mL/min (ref >60)
Glucose, Bld: 102 mg/dL — ABNORMAL HIGH (ref 70–99)
Potassium: 3.6 mmol/L (ref 3.5–5.1)
Sodium: 139 mmol/L (ref 135–145)
Total Bilirubin: 0.6 mg/dL (ref 0.3–1.2)
Total Protein: 7.2 g/dL (ref 6.5–8.1)

## 2019-09-30 LAB — PROTIME-INR
INR: 1 (ref 0.8–1.2)
Prothrombin Time: 13.2 seconds (ref 11.4–15.2)

## 2019-09-30 LAB — SURGICAL PCR SCREEN
MRSA, PCR: NEGATIVE
Staphylococcus aureus: NEGATIVE

## 2019-09-30 LAB — SARS CORONAVIRUS 2 (TAT 6-24 HRS): SARS Coronavirus 2: NEGATIVE

## 2019-09-30 LAB — APTT: aPTT: 27 seconds (ref 24–36)

## 2019-09-30 MED FILL — oxyCODONE HCL 5 MG TABS: 5 | 5 days supply | Qty: 40 | Fill #0

## 2019-09-30 NOTE — Progress Notes (Signed)
PCP - Dr. Sarina Ser  Cardiologist - Dr. Rinaldo Cloud  Chest x-ray - Denies  EKG - 09/30/19  Stress Test - 04/05/17 (E)  ECHO - Denies  Cardiac Cath - Denies  AICD-na PM-na LOOP-na  Sleep Study - Yes- Positive CPAP - Denies- but sts he needs another test for a new machine.  LABS- 09/30/19: CBC w/D, CMP, PT, PTT, T/S, UA, PCR, COVID  ASA- Denies  ERAS- Yes- 1 drink given  HA1C- Denies  Anesthesia- No  Pt denies having chest pain, sob, or fever at this time. All instructions explained to the pt, with a verbal understanding of the material. Pt agrees to go over the instructions while at home for a better understanding. Pt also instructed to self quarantine after being tested for COVID-19. The opportunity to ask questions was provided.   Coronavirus Screening  Have you experienced the following symptoms:  Cough yes/no: No Fever (>100.39F)  yes/no: No Runny nose yes/no: No Sore throat yes/no: No Difficulty breathing/shortness of breath  yes/no: No  Have you or a family member traveled in the last 14 days and where? yes/no: No   If the patient indicates "YES" to the above questions, their PAT will be rescheduled to limit the exposure to others and, the surgeon will be notified. THE PATIENT WILL NEED TO BE ASYMPTOMATIC FOR 14 DAYS.   If the patient is not experiencing any of these symptoms, the PAT nurse will instruct them to NOT bring anyone with them to their appointment since they may have these symptoms or traveled as well.   Please remind your patients and families that hospital visitation restrictions are in effect and the importance of the restrictions.

## 2019-09-30 NOTE — Pre-Procedure Instructions (Signed)
[place], [place] - [address] [facility]. [address] [place] [address] Phone: [phone] Fax: [place], [place] - [place] [address] Phone: [phone] Fax: [phone]    Your procedure is scheduled on Wed. Feb. 24, 2021 from 12:16PM-3:55PM  Report to [facility] Entrance "A" at 9:15AM  Call this number if you have problems the morning of surgery:  [phone]   Remember:  Do not eat after midnight on Feb. 23rd  You may drink clear liquids until 8:45AM prior to surgery .  Clear liquids allowed are:                    Water, Juice (non-citric and without pulp), Carbonated beverages, Clear Tea, Black Coffee only, Plain Jell-O only, Gatorade and Plain Popsicles only    Enhanced Recovery after Surgery for Orthopedics Enhanced Recovery after Surgery is a protocol used to improve the stress on your body and your recovery after surgery.  Patient Instructions .  Marland Kitchen The day of surgery (if you do NOT have diabetes): Drink by: 8:45AM o Drink ONE (1) Pre-Surgery Clear Ensure as directed.   o This drink was given to you during your hospital  pre-op appointment visit. o Finish the drink at the designated time by the pre-op nurse.  o Nothing else to drink after completing the  Pre-Surgery Clear Ensure.         If you have questions, please contact your surgeon's office.     Take these medicines the morning of surgery with A SIP OF WATER: AmLODipine (NORVASC) Fenofibrate   FLUoxetine (PROZAC)  Omeprazole (PRILOSEC)  If Needed: Acetaminophen (TYLENOL) Oxycodone HCl  As of today, stop taking all Aspirin (unless instructed by your doctor) and Other Aspirin containing products, Vitamins, Fish oils, and Herbal medications. Also stop all NSAIDS i.e. Advil, Ibuprofen, Motrin, Aleve, Anaprox, Naproxen, BC, Goody Powders, and all  Supplements.  No Smoking of any kind, Tobacco, or Alcohol products 24 hours prior to your procedure. If you use a Cpap at night, you may bring all equipment for your overnight stay.   Special instructions:   - Preparing For Surgery  Before surgery, you can play an important role. Because skin is not sterile, your skin needs to be as free of germs as possible. You can reduce the number of germs on your skin by washing with CHG (chlorahexidine gluconate) Soap before surgery.  CHG is an antiseptic cleaner which kills germs and bonds with the skin to continue killing germs even after washing.    Please do not use if you have an allergy to CHG or antibacterial soaps. If your skin becomes reddened/irritated stop using the CHG.  Do not shave (including legs and underarms) for at least 48 hours prior to first CHG shower. It is OK to shave your face.  Please follow these instructions carefully.   1. Shower the NIGHT BEFORE SURGERY and the MORNING OF SURGERY with CHG.   2. If you chose to wash your hair, wash your hair first as usual with your normal shampoo.  3. After you shampoo, rinse your hair and body thoroughly to remove the shampoo.  4. Use CHG as you would any other liquid soap. You can apply CHG directly to the skin and wash gently with a scrungie or a clean washcloth.   5. Apply the CHG Soap to your body ONLY FROM  THE NECK DOWN.  Do not use on open wounds or open sores. Avoid contact with your eyes, ears, mouth and genitals (private parts). Wash Face and genitals (private parts)  with your normal soap.  6. Wash thoroughly, paying special attention to the area where your surgery will be performed.  7. Thoroughly rinse your body with warm water from the neck down.  8. DO NOT shower/wash with your normal soap after using and rinsing off the CHG Soap.  9. Pat yourself dry with a CLEAN TOWEL.  10. Wear CLEAN PAJAMAS to bed the night before surgery, wear comfortable clothes the  morning of surgery  11. Place CLEAN SHEETS on your bed the night of your first shower and DO NOT SLEEP WITH PETS.   Day of Surgery:           Remember to brush your teeth WITH YOUR REGULAR TOOTHPASTE.  Do not wear jewelry.  Do not wear lotions, powders, colognes, or deodorant.  Do not shave 48 hours prior to surgery.  Men may shave face and neck.  Do not bring valuables to the hospital.  [facility] is not responsible for any belongings or valuables.  Contacts, dentures or bridgework may not be worn into surgery.    For patients admitted to the hospital, discharge time will be determined by your treatment team.  Patients discharged the day of surgery will not be allowed to drive home, and someone age 20 and over needs to stay with them for 24 hours.   Please wear clean clothes to the hospital/surgery center.    Please read over the following fact sheets that you were given.

## 2019-10-01 MED ORDER — DEXTROSE 5 % IV SOLN
3.0000 g | INTRAVENOUS | Status: AC
  Administered 2019-10-02: 3 g via INTRAVENOUS
  Filled 2019-10-01: qty 3000
  Filled 2019-10-01: qty 3

## 2019-10-02 ENCOUNTER — Inpatient Hospital Stay (HOSPITAL_COMMUNITY): Payer: 59 | Admitting: Certified Registered Nurse Anesthetist

## 2019-10-02 ENCOUNTER — Encounter (HOSPITAL_COMMUNITY): Payer: Self-pay | Admitting: Orthopedic Surgery

## 2019-10-02 ENCOUNTER — Encounter (HOSPITAL_COMMUNITY)
Admission: RE | Disposition: A | Discharge status: Home Health | Payer: Self-pay | Source: Home / Self Care | Attending: Orthopedic Surgery

## 2019-10-02 ENCOUNTER — Inpatient Hospital Stay (HOSPITAL_COMMUNITY): Payer: 59

## 2019-10-02 ENCOUNTER — Other Ambulatory Visit: Payer: Self-pay

## 2019-10-02 ENCOUNTER — Inpatient Hospital Stay (HOSPITAL_COMMUNITY)
Admission: RE | Admit: 2019-10-02 | Discharge: 2019-10-04 | DRG: 460 | Disposition: A | Discharge status: Home Health | Payer: 59 | Attending: Orthopedic Surgery | Admitting: Orthopedic Surgery

## 2019-10-02 DIAGNOSIS — Z791 Long term (current) use of non-steroidal anti-inflammatories (NSAID): Secondary | ICD-10-CM | POA: Diagnosis not present

## 2019-10-02 DIAGNOSIS — M960 Pseudarthrosis after fusion or arthrodesis: Secondary | ICD-10-CM | POA: Diagnosis not present

## 2019-10-02 DIAGNOSIS — S32009K Unspecified fracture of unspecified lumbar vertebra, subsequent encounter for fracture with nonunion: Secondary | ICD-10-CM | POA: Diagnosis present

## 2019-10-02 DIAGNOSIS — I10 Essential (primary) hypertension: Secondary | ICD-10-CM | POA: Diagnosis not present

## 2019-10-02 DIAGNOSIS — Z8249 Family history of ischemic heart disease and other diseases of the circulatory system: Secondary | ICD-10-CM | POA: Diagnosis not present

## 2019-10-02 DIAGNOSIS — F17210 Nicotine dependence, cigarettes, uncomplicated: Secondary | ICD-10-CM | POA: Diagnosis present

## 2019-10-02 DIAGNOSIS — F419 Anxiety disorder, unspecified: Secondary | ICD-10-CM | POA: Diagnosis present

## 2019-10-02 DIAGNOSIS — S32040K Wedge compression fracture of fourth lumbar vertebra, subsequent encounter for fracture with nonunion: Secondary | ICD-10-CM | POA: Diagnosis not present

## 2019-10-02 DIAGNOSIS — G4730 Sleep apnea, unspecified: Secondary | ICD-10-CM | POA: Diagnosis present

## 2019-10-02 DIAGNOSIS — Z20822 Contact with and (suspected) exposure to covid-19: Secondary | ICD-10-CM | POA: Diagnosis present

## 2019-10-02 DIAGNOSIS — F329 Major depressive disorder, single episode, unspecified: Secondary | ICD-10-CM | POA: Diagnosis present

## 2019-10-02 DIAGNOSIS — S32050K Wedge compression fracture of fifth lumbar vertebra, subsequent encounter for fracture with nonunion: Secondary | ICD-10-CM | POA: Diagnosis not present

## 2019-10-02 DIAGNOSIS — M4326 Fusion of spine, lumbar region: Secondary | ICD-10-CM | POA: Diagnosis not present

## 2019-10-02 DIAGNOSIS — Z981 Arthrodesis status: Secondary | ICD-10-CM | POA: Diagnosis not present

## 2019-10-02 DIAGNOSIS — M545 Low back pain: Secondary | ICD-10-CM | POA: Diagnosis not present

## 2019-10-02 DIAGNOSIS — S3210XK Unspecified fracture of sacrum, subsequent encounter for fracture with nonunion: Secondary | ICD-10-CM | POA: Diagnosis not present

## 2019-10-02 DIAGNOSIS — Z79899 Other long term (current) drug therapy: Secondary | ICD-10-CM

## 2019-10-02 DIAGNOSIS — Z419 Encounter for procedure for purposes other than remedying health state, unspecified: Secondary | ICD-10-CM

## 2019-10-02 DIAGNOSIS — G8929 Other chronic pain: Secondary | ICD-10-CM | POA: Diagnosis present

## 2019-10-02 HISTORY — DX: Unspecified fracture of unspecified lumbar vertebra, subsequent encounter for fracture with nonunion: S32.009K

## 2019-10-02 HISTORY — DX: Arthrodesis status: Z98.1

## 2019-10-02 SURGERY — POSTERIOR LUMBAR FUSION 2 LEVEL
Anesthesia: General

## 2019-10-02 MED ORDER — SENNOSIDES-DOCUSATE SODIUM 8.6-50 MG PO TABS: 1.0000 | ORAL_TABLET | Freq: Every evening | ORAL | Status: DC | PRN

## 2019-10-02 MED ORDER — ZOLPIDEM TARTRATE 5 MG PO TABS
5.0000 mg | ORAL_TABLET | Freq: Every evening | ORAL | Status: DC | PRN
  Administered 2019-10-03: 5 mg via ORAL
  Filled 2019-10-02: qty 1

## 2019-10-02 MED ORDER — BUPIVACAINE HCL (PF) 0.25 % IJ SOLN
INTRAMUSCULAR | Status: AC
  Filled 2019-10-02: qty 10

## 2019-10-02 MED ORDER — DIAZEPAM 5 MG PO TABS
5.0000 mg | ORAL_TABLET | Freq: Four times a day (QID) | ORAL | Status: DC | PRN
  Administered 2019-10-02 – 2019-10-04 (×4): 5 mg via ORAL
  Filled 2019-10-02 (×5): qty 1

## 2019-10-02 MED ORDER — MIDAZOLAM HCL 2 MG/2ML IJ SOLN
INTRAMUSCULAR | Status: AC
  Filled 2019-10-02: qty 2

## 2019-10-02 MED ORDER — OXYCODONE HCL ER 10 MG PO T12A
20.0000 mg | EXTENDED_RELEASE_TABLET | Freq: Two times a day (BID) | ORAL | Status: DC
  Administered 2019-10-02 – 2019-10-04 (×4): 20 mg via ORAL
  Filled 2019-10-02 (×4): qty 2

## 2019-10-02 MED ORDER — 0.9 % SODIUM CHLORIDE (POUR BTL) OPTIME
TOPICAL | Status: DC | PRN
  Administered 2019-10-02 (×3): 1000 mL

## 2019-10-02 MED ORDER — FLEET ENEMA 7-19 GM/118ML RE ENEM: 1.0000 | ENEMA | Freq: Once | RECTAL | Status: DC | PRN

## 2019-10-02 MED ORDER — FLUOXETINE HCL 10 MG PO CAPS
10.0000 mg | ORAL_CAPSULE | Freq: Every day | ORAL | Status: DC
  Administered 2019-10-03 – 2019-10-04 (×2): 10 mg via ORAL
  Filled 2019-10-02 (×2): qty 1

## 2019-10-02 MED ORDER — HYDROMORPHONE HCL 1 MG/ML IJ SOLN
INTRAMUSCULAR | Status: AC
  Filled 2019-10-02: qty 1

## 2019-10-02 MED ORDER — PHENYLEPHRINE 40 MCG/ML (10ML) SYRINGE FOR IV PUSH (FOR BLOOD PRESSURE SUPPORT)
PREFILLED_SYRINGE | INTRAVENOUS | Status: DC | PRN
  Administered 2019-10-02 (×2): 80 ug via INTRAVENOUS
  Administered 2019-10-02: 120 ug via INTRAVENOUS

## 2019-10-02 MED ORDER — KETAMINE HCL 10 MG/ML IJ SOLN
INTRAMUSCULAR | Status: DC | PRN
  Administered 2019-10-02 (×2): 10 mg via INTRAVENOUS
  Administered 2019-10-02: 50 mg via INTRAVENOUS
  Administered 2019-10-02: 10 mg via INTRAVENOUS
  Administered 2019-10-02: 20 mg via INTRAVENOUS

## 2019-10-02 MED ORDER — ONDANSETRON HCL 4 MG/2ML IJ SOLN
INTRAMUSCULAR | Status: AC
  Filled 2019-10-02: qty 2

## 2019-10-02 MED ORDER — PROPOFOL 10 MG/ML IV BOLUS
INTRAVENOUS | Status: AC
  Filled 2019-10-02: qty 20

## 2019-10-02 MED ORDER — ONDANSETRON HCL 4 MG/2ML IJ SOLN: 4.0000 mg | Freq: Four times a day (QID) | INTRAMUSCULAR | Status: DC | PRN

## 2019-10-02 MED ORDER — ACETAMINOPHEN 650 MG RE SUPP: 650.0000 mg | RECTAL | Status: DC | PRN

## 2019-10-02 MED ORDER — POVIDONE-IODINE 7.5 % EX SOLN
Freq: Once | CUTANEOUS | Status: DC
  Filled 2019-10-02: qty 118

## 2019-10-02 MED ORDER — PHENOL 1.4 % MT LIQD: 1.0000 | OROMUCOSAL | Status: DC | PRN

## 2019-10-02 MED ORDER — FENTANYL CITRATE (PF) 250 MCG/5ML IJ SOLN
INTRAMUSCULAR | Status: AC
  Filled 2019-10-02: qty 5

## 2019-10-02 MED ORDER — FENTANYL CITRATE (PF) 250 MCG/5ML IJ SOLN
INTRAMUSCULAR | Status: DC | PRN
  Administered 2019-10-02: 50 ug via INTRAVENOUS
  Administered 2019-10-02: 100 ug via INTRAVENOUS
  Administered 2019-10-02 (×7): 50 ug via INTRAVENOUS

## 2019-10-02 MED ORDER — AMLODIPINE BESYLATE 10 MG PO TABS: 10.0000 mg | ORAL_TABLET | Freq: Every day | ORAL | Status: DC

## 2019-10-02 MED ORDER — ACETAMINOPHEN 500 MG PO TABS: 1000.0000 mg | ORAL_TABLET | Freq: Once | ORAL | Status: DC

## 2019-10-02 MED ORDER — POTASSIUM CHLORIDE IN NACL 20-0.9 MEQ/L-% IV SOLN
INTRAVENOUS | Status: DC
  Filled 2019-10-02: qty 1000

## 2019-10-02 MED ORDER — DOCUSATE SODIUM 100 MG PO CAPS
100.0000 mg | ORAL_CAPSULE | Freq: Two times a day (BID) | ORAL | Status: DC
  Administered 2019-10-02 – 2019-10-04 (×4): 100 mg via ORAL
  Filled 2019-10-02 (×4): qty 1

## 2019-10-02 MED ORDER — PHENYLEPHRINE HCL-NACL 10-0.9 MG/250ML-% IV SOLN
INTRAVENOUS | Status: DC | PRN
  Administered 2019-10-02: 50 ug/min via INTRAVENOUS

## 2019-10-02 MED ORDER — LIDOCAINE 2% (20 MG/ML) 5 ML SYRINGE
INTRAMUSCULAR | Status: DC | PRN
  Administered 2019-10-02: 60 mg via INTRAVENOUS

## 2019-10-02 MED ORDER — PROPOFOL 500 MG/50ML IV EMUL
INTRAVENOUS | Status: DC | PRN
  Administered 2019-10-02: 50 ug/kg/min via INTRAVENOUS
  Administered 2019-10-02: 75 ug/kg/min via INTRAVENOUS
  Administered 2019-10-02: 50 ug/kg/min via INTRAVENOUS

## 2019-10-02 MED ORDER — BUPIVACAINE LIPOSOME 1.3 % IJ SUSP
20.0000 mL | Freq: Once | INTRAMUSCULAR | Status: DC
  Filled 2019-10-02: qty 20

## 2019-10-02 MED ORDER — FENTANYL CITRATE (PF) 100 MCG/2ML IJ SOLN: 100.0000 ug | Freq: Once | INTRAMUSCULAR | Status: AC

## 2019-10-02 MED ORDER — LIDOCAINE 2% (20 MG/ML) 5 ML SYRINGE
INTRAMUSCULAR | Status: AC
  Filled 2019-10-02: qty 5

## 2019-10-02 MED ORDER — THROMBIN (RECOMBINANT) 20000 UNITS EX SOLR
CUTANEOUS | Status: AC
  Filled 2019-10-02: qty 20000

## 2019-10-02 MED ORDER — HYDROMORPHONE HCL 1 MG/ML IJ SOLN
0.2500 mg | INTRAMUSCULAR | Status: DC | PRN
  Administered 2019-10-02: 0.25 mg via INTRAVENOUS
  Administered 2019-10-02: 0.5 mg via INTRAVENOUS
  Administered 2019-10-02: 0.25 mg via INTRAVENOUS
  Administered 2019-10-02: 0.5 mg via INTRAVENOUS

## 2019-10-02 MED ORDER — SUGAMMADEX SODIUM 200 MG/2ML IV SOLN
INTRAVENOUS | Status: DC | PRN
  Administered 2019-10-02: 270 mg via INTRAVENOUS

## 2019-10-02 MED ORDER — METHOCARBAMOL 750 MG PO TABS
375.0000 mg | ORAL_TABLET | ORAL | Status: DC | PRN
  Administered 2019-10-03 (×5): 375 mg via ORAL
  Filled 2019-10-02 (×5): qty 1

## 2019-10-02 MED ORDER — MORPHINE SULFATE (PF) 2 MG/ML IV SOLN
1.0000 mg | INTRAVENOUS | Status: DC | PRN
  Administered 2019-10-02: 1 mg via INTRAVENOUS
  Administered 2019-10-03 – 2019-10-04 (×7): 2 mg via INTRAVENOUS
  Filled 2019-10-02 (×8): qty 1

## 2019-10-02 MED ORDER — ONDANSETRON HCL 4 MG/2ML IJ SOLN
INTRAMUSCULAR | Status: DC | PRN
  Administered 2019-10-02: 4 mg via INTRAVENOUS

## 2019-10-02 MED ORDER — HEMOSTATIC AGENTS (NO CHARGE) OPTIME
TOPICAL | Status: DC | PRN
  Administered 2019-10-02: 1 via TOPICAL

## 2019-10-02 MED ORDER — BISACODYL 5 MG PO TBEC: 5.0000 mg | DELAYED_RELEASE_TABLET | Freq: Every day | ORAL | Status: DC | PRN

## 2019-10-02 MED ORDER — ROCURONIUM BROMIDE 10 MG/ML (PF) SYRINGE
PREFILLED_SYRINGE | INTRAVENOUS | Status: DC | PRN
  Administered 2019-10-02: 30 mg via INTRAVENOUS
  Administered 2019-10-02: 20 mg via INTRAVENOUS
  Administered 2019-10-02 (×3): 50 mg via INTRAVENOUS

## 2019-10-02 MED ORDER — AMLODIPINE BESYLATE 10 MG PO TABS
10.0000 mg | ORAL_TABLET | Freq: Every day | ORAL | Status: DC
  Administered 2019-10-02 – 2019-10-03 (×2): 10 mg via ORAL
  Filled 2019-10-02 (×2): qty 1

## 2019-10-02 MED ORDER — MONTELUKAST SODIUM 10 MG PO TABS
10.0000 mg | ORAL_TABLET | Freq: Every day | ORAL | Status: DC
  Administered 2019-10-02 – 2019-10-03 (×2): 10 mg via ORAL
  Filled 2019-10-02 (×2): qty 1

## 2019-10-02 MED ORDER — SODIUM CHLORIDE 0.9% FLUSH
3.0000 mL | Freq: Two times a day (BID) | INTRAVENOUS | Status: DC
  Administered 2019-10-02 – 2019-10-04 (×4): 3 mL via INTRAVENOUS

## 2019-10-02 MED ORDER — LISINOPRIL 20 MG PO TABS
20.0000 mg | ORAL_TABLET | Freq: Every day | ORAL | Status: DC
  Administered 2019-10-02 – 2019-10-03 (×2): 20 mg via ORAL
  Filled 2019-10-02 (×2): qty 1

## 2019-10-02 MED ORDER — ACETAMINOPHEN 10 MG/ML IV SOLN
INTRAVENOUS | Status: DC | PRN
  Administered 2019-10-02: 1000 mg via INTRAVENOUS

## 2019-10-02 MED ORDER — ONDANSETRON HCL 4 MG PO TABS
4.0000 mg | ORAL_TABLET | Freq: Four times a day (QID) | ORAL | Status: DC | PRN
  Administered 2019-10-03: 4 mg via ORAL
  Filled 2019-10-02: qty 1

## 2019-10-02 MED ORDER — ALBUMIN HUMAN 5 % IV SOLN: INTRAVENOUS | Status: DC | PRN

## 2019-10-02 MED ORDER — LACTATED RINGERS IV SOLN: INTRAVENOUS | Status: DC

## 2019-10-02 MED ORDER — THROMBIN 20000 UNITS EX SOLR
CUTANEOUS | Status: DC | PRN
  Administered 2019-10-02: 20000 [IU] via TOPICAL

## 2019-10-02 MED ORDER — FENOFIBRATE 160 MG PO TABS
160.0000 mg | ORAL_TABLET | Freq: Every day | ORAL | Status: DC
  Administered 2019-10-02 – 2019-10-03 (×2): 160 mg via ORAL
  Filled 2019-10-02 (×2): qty 1

## 2019-10-02 MED ORDER — ACETAMINOPHEN 325 MG PO TABS
650.0000 mg | ORAL_TABLET | ORAL | Status: DC | PRN
  Administered 2019-10-03 (×2): 650 mg via ORAL
  Filled 2019-10-02 (×2): qty 2

## 2019-10-02 MED ORDER — METHYLENE BLUE 0.5 % INJ SOLN
INTRAVENOUS | Status: DC | PRN
  Administered 2019-10-02: .05 mL

## 2019-10-02 MED ORDER — EPINEPHRINE PF 1 MG/ML IJ SOLN
INTRAMUSCULAR | Status: AC
  Filled 2019-10-02: qty 1

## 2019-10-02 MED ORDER — ALUM & MAG HYDROXIDE-SIMETH 200-200-20 MG/5ML PO SUSP: 30.0000 mL | Freq: Four times a day (QID) | ORAL | Status: DC | PRN

## 2019-10-02 MED ORDER — ACETAMINOPHEN 10 MG/ML IV SOLN
INTRAVENOUS | Status: AC
  Filled 2019-10-02: qty 100

## 2019-10-02 MED ORDER — MENTHOL 3 MG MT LOZG: 1.0000 | LOZENGE | OROMUCOSAL | Status: DC | PRN

## 2019-10-02 MED ORDER — PROPOFOL 10 MG/ML IV BOLUS
INTRAVENOUS | Status: DC | PRN
  Administered 2019-10-02: 150 mg via INTRAVENOUS

## 2019-10-02 MED ORDER — OXYCODONE HCL 5 MG PO TABS
10.0000 mg | ORAL_TABLET | ORAL | Status: DC | PRN
  Administered 2019-10-02 – 2019-10-04 (×7): 10 mg via ORAL
  Filled 2019-10-02 (×8): qty 2

## 2019-10-02 MED ORDER — SODIUM CHLORIDE 0.9 % IV SOLN
250.0000 mL | INTRAVENOUS | Status: DC
  Administered 2019-10-02: 250 mL via INTRAVENOUS

## 2019-10-02 MED ORDER — LACTATED RINGERS IV SOLN: INTRAVENOUS | Status: DC | PRN

## 2019-10-02 MED ORDER — MIDAZOLAM HCL 5 MG/5ML IJ SOLN
INTRAMUSCULAR | Status: DC | PRN
  Administered 2019-10-02: 2 mg via INTRAVENOUS

## 2019-10-02 MED ORDER — METHYLENE BLUE 0.5 % INJ SOLN: INTRAVENOUS | Status: DC | PRN

## 2019-10-02 MED ORDER — CEFAZOLIN SODIUM-DEXTROSE 2-4 GM/100ML-% IV SOLN
2.0000 g | Freq: Three times a day (TID) | INTRAVENOUS | Status: AC
  Administered 2019-10-02 – 2019-10-03 (×2): 2 g via INTRAVENOUS
  Filled 2019-10-02 (×2): qty 100

## 2019-10-02 MED ORDER — KETAMINE HCL 50 MG/5ML IJ SOSY
PREFILLED_SYRINGE | INTRAMUSCULAR | Status: AC
  Filled 2019-10-02: qty 10

## 2019-10-02 MED ORDER — BUPIVACAINE LIPOSOME 1.3 % IJ SUSP
INTRAMUSCULAR | Status: DC | PRN
  Administered 2019-10-02: 20 mL

## 2019-10-02 MED ORDER — FENOFIBRATE 160 MG PO TABS: 160.0000 mg | ORAL_TABLET | Freq: Every day | ORAL | Status: DC

## 2019-10-02 MED ORDER — BUPIVACAINE-EPINEPHRINE 0.25% -1:200000 IJ SOLN
INTRAMUSCULAR | Status: DC | PRN
  Administered 2019-10-02: 8 mL
  Administered 2019-10-02: 20 mL

## 2019-10-02 MED ORDER — FENTANYL CITRATE (PF) 100 MCG/2ML IJ SOLN
INTRAMUSCULAR | Status: AC
  Administered 2019-10-02: 100 ug via INTRAVENOUS
  Filled 2019-10-02: qty 2

## 2019-10-02 MED ORDER — SODIUM CHLORIDE 0.9% FLUSH: 3.0000 mL | INTRAVENOUS | Status: DC | PRN

## 2019-10-02 MED ORDER — FAMOTIDINE 20 MG PO TABS: 20.0000 mg | ORAL_TABLET | Freq: Two times a day (BID) | ORAL | Status: DC | PRN

## 2019-10-02 MED ORDER — ICOSAPENT ETHYL 1 G PO CAPS
2.0000 g | ORAL_CAPSULE | Freq: Two times a day (BID) | ORAL | Status: DC
  Administered 2019-10-02 – 2019-10-04 (×4): 2 g via ORAL
  Filled 2019-10-02 (×7): qty 2

## 2019-10-02 MED ORDER — METHYLENE BLUE 0.5 % INJ SOLN
INTRAVENOUS | Status: AC
  Filled 2019-10-02: qty 10

## 2019-10-02 MED ORDER — PANTOPRAZOLE SODIUM 40 MG PO TBEC
40.0000 mg | DELAYED_RELEASE_TABLET | Freq: Every day | ORAL | Status: DC
  Administered 2019-10-02 – 2019-10-04 (×3): 40 mg via ORAL
  Filled 2019-10-02 (×3): qty 1

## 2019-10-02 MED FILL — oxyCODONE HCL 10 MG TABS: 10 | 30 days supply | Qty: 180 | Fill #0

## 2019-10-02 SURGICAL SUPPLY — 92 items
BENZOIN TINCTURE PRP APPL 2/3 (GAUZE/BANDAGES/DRESSINGS) ×2 IMPLANT
BLADE CLIPPER SURG (BLADE) IMPLANT
BONE VIVIGEN FORMABLE 5.4CC (Bone Implant) ×6 IMPLANT
BUR FLUTED DRUM 5.0X7.0MM (BURR) ×2 IMPLANT
BUR PRESCISION 1.7 ELITE (BURR) ×2 IMPLANT
CARTRIDGE OIL MAESTRO DRILL (MISCELLANEOUS) ×2 IMPLANT
CLOSURE STERI-STRIP 1/2X4 (GAUZE/BANDAGES/DRESSINGS) ×1
CLOSURE WOUND 1/2 X4 (GAUZE/BANDAGES/DRESSINGS) ×1
CLSR STERI-STRIP ANTIMIC 1/2X4 (GAUZE/BANDAGES/DRESSINGS) ×1 IMPLANT
CNTNR URN SCR LID CUP LEK RST (MISCELLANEOUS) ×1 IMPLANT
CONT SPEC 4OZ STRL OR WHT (MISCELLANEOUS) ×2
CORD BI POLAR (MISCELLANEOUS) ×2 IMPLANT
COVER SURGICAL LIGHT HANDLE (MISCELLANEOUS) ×3 IMPLANT
COVER WAND RF STERILE (DRAPES) ×1 IMPLANT
DIFFUSER DRILL AIR PNEUMATIC (MISCELLANEOUS) ×4 IMPLANT
DRAIN CHANNEL 15F RND FF W/TCR (WOUND CARE) ×3 IMPLANT
DRAPE C-ARM 42X72 X-RAY (DRAPES) ×3 IMPLANT
DRAPE C-ARMOR (DRAPES) ×2 IMPLANT
DRAPE POUCH INSTRU U-SHP 10X18 (DRAPES) ×5 IMPLANT
DRAPE SURG 17X23 STRL (DRAPES) ×12 IMPLANT
DRSG MEPILEX BORDER 4X12 (GAUZE/BANDAGES/DRESSINGS) IMPLANT
DRSG MEPILEX BORDER 4X8 (GAUZE/BANDAGES/DRESSINGS) IMPLANT
DURAPREP 26ML APPLICATOR (WOUND CARE) ×3 IMPLANT
ELECT BLADE 4.0 EZ CLEAN MEGAD (MISCELLANEOUS) ×9
ELECT CAUTERY BLADE 6.4 (BLADE) ×6 IMPLANT
ELECT PENCIL ROCKER SW 15FT (MISCELLANEOUS) ×2 IMPLANT
ELECT REM PT RETURN 9FT ADLT (ELECTROSURGICAL) ×9
ELECTRODE BLDE 4.0 EZ CLN MEGD (MISCELLANEOUS) ×1 IMPLANT
ELECTRODE REM PT RTRN 9FT ADLT (ELECTROSURGICAL) ×1 IMPLANT
EVACUATOR SILICONE 100CC (DRAIN) ×3 IMPLANT
FEE INTRAOP MONITOR IMPULS NCS (MISCELLANEOUS) IMPLANT
GAUZE 4X4 16PLY RFD (DISPOSABLE) ×6 IMPLANT
GAUZE SPONGE 4X4 12PLY STRL (GAUZE/BANDAGES/DRESSINGS) ×3 IMPLANT
GLOVE BIO SURGEON STRL SZ7 (GLOVE) ×3 IMPLANT
GLOVE BIO SURGEON STRL SZ8 (GLOVE) ×3 IMPLANT
GLOVE BIOGEL PI IND STRL 7.0 (GLOVE) ×1 IMPLANT
GLOVE BIOGEL PI IND STRL 8 (GLOVE) ×1 IMPLANT
GLOVE BIOGEL PI INDICATOR 7.0 (GLOVE) ×6
GLOVE BIOGEL PI INDICATOR 8 (GLOVE) ×2
GOWN STRL REUS W/ TWL LRG LVL3 (GOWN DISPOSABLE) ×2 IMPLANT
GOWN STRL REUS W/ TWL XL LVL3 (GOWN DISPOSABLE) ×1 IMPLANT
GOWN STRL REUS W/TWL LRG LVL3 (GOWN DISPOSABLE) ×4
GOWN STRL REUS W/TWL XL LVL3 (GOWN DISPOSABLE) ×2
GRAFT BNE MATRIX VG FRMBL MD 5 (Bone Implant) IMPLANT
INTRAOP MONITOR FEE IMPULS NCS (MISCELLANEOUS) ×1
INTRAOP MONITOR FEE IMPULSE (MISCELLANEOUS) ×2
IV CATH 14GX2 1/4 (CATHETERS) ×3 IMPLANT
KIT BASIN OR (CUSTOM PROCEDURE TRAY) ×3 IMPLANT
KIT INFUSE MEDIUM (Orthopedic Implant) ×2 IMPLANT
KIT POSITION SURG JACKSON T1 (MISCELLANEOUS) ×3 IMPLANT
KIT TURNOVER KIT B (KITS) ×3 IMPLANT
MARKER SKIN DUAL TIP RULER LAB (MISCELLANEOUS) ×3 IMPLANT
NDL HYPO 25GX1X1/2 BEV (NEEDLE) ×1 IMPLANT
NDL SPNL 18GX3.5 QUINCKE PK (NEEDLE) ×2 IMPLANT
NEEDLE HYPO 25GX1X1/2 BEV (NEEDLE) ×3 IMPLANT
NEEDLE SPNL 18GX3.5 QUINCKE PK (NEEDLE) ×6 IMPLANT
NS IRRIG 1000ML POUR BTL (IV SOLUTION) ×3 IMPLANT
OIL CARTRIDGE MAESTRO DRILL (MISCELLANEOUS) ×6
PACK LAMINECTOMY ORTHO (CUSTOM PROCEDURE TRAY) ×3 IMPLANT
PACK UNIVERSAL I (CUSTOM PROCEDURE TRAY) ×3 IMPLANT
PAD ARMBOARD 7.5X6 YLW CONV (MISCELLANEOUS) ×6 IMPLANT
PATTIES SURGICAL .5 X.5 (GAUZE/BANDAGES/DRESSINGS) ×2 IMPLANT
PATTIES SURGICAL .5 X1 (DISPOSABLE) ×3 IMPLANT
PATTIES SURGICAL .5 X3 (DISPOSABLE) IMPLANT
PATTIES SURGICAL .5X1.5 (GAUZE/BANDAGES/DRESSINGS) ×1 IMPLANT
PATTIES SURGICAL .75X.75 (GAUZE/BANDAGES/DRESSINGS) ×1 IMPLANT
PROBE NEUROSIGN BIPOLAR (INSTRUMENTS) ×2 IMPLANT
ROD EXPEDIUM PER BENT 65MM (Rod) ×2 IMPLANT
ROD EXPEDIUM PRE BENT 55MM (Rod) ×2 IMPLANT
SCREW CORTICAL VIPER 7X45MM (Screw) ×12 IMPLANT
SCREW SET SINGLE INNER (Screw) ×12 IMPLANT
SPONGE INTESTINAL PEANUT (DISPOSABLE) IMPLANT
SPONGE SURGIFOAM ABS GEL 100 (HEMOSTASIS) ×2 IMPLANT
STRIP CLOSURE SKIN 1/2X4 (GAUZE/BANDAGES/DRESSINGS) ×1 IMPLANT
SURGIFLO W/THROMBIN 8M KIT (HEMOSTASIS) IMPLANT
SUT MNCRL AB 4-0 PS2 18 (SUTURE) ×7 IMPLANT
SUT VIC AB 0 CT1 18XCR BRD 8 (SUTURE) ×2 IMPLANT
SUT VIC AB 0 CT1 8-18 (SUTURE) ×2
SUT VIC AB 1 CT1 18XCR BRD 8 (SUTURE) ×2 IMPLANT
SUT VIC AB 1 CT1 8-18 (SUTURE) ×4
SUT VIC AB 2-0 CT2 18 VCP726D (SUTURE) ×8 IMPLANT
SYR 20ML LL LF (SYRINGE) ×3 IMPLANT
SYR BULB IRRIGATION 50ML (SYRINGE) ×3 IMPLANT
SYR CONTROL 10ML LL (SYRINGE) ×8 IMPLANT
SYR TB 1ML LUER SLIP (SYRINGE) ×3 IMPLANT
TAP EXPEDIUM DL 6.0 (INSTRUMENTS) ×2 IMPLANT
TAPE CLOTH SURG 4X10 WHT LF (GAUZE/BANDAGES/DRESSINGS) IMPLANT
TOWEL GREEN STERILE (TOWEL DISPOSABLE) ×3 IMPLANT
TOWEL GREEN STERILE FF (TOWEL DISPOSABLE) ×3 IMPLANT
TRAY FOLEY MTR SLVR 16FR STAT (SET/KITS/TRAYS/PACK) ×3 IMPLANT
WATER STERILE IRR 1000ML POUR (IV SOLUTION) ×3 IMPLANT
YANKAUER SUCT BULB TIP NO VENT (SUCTIONS) ×7 IMPLANT

## 2019-10-02 NOTE — Anesthesia Postprocedure Evaluation (Signed)
Anesthesia Post Note  Patient: [name]  Procedure(s) Performed: LUMBAR 4-SACRUM 1 POSTERIOR SPINAL FUSION WITH INSTRUMENTATION AND ALLOGRAFT WITH BONE MORPHOGENIC PROTEIN (N/A )     Patient location during evaluation: PACU Anesthesia Type: General Level of consciousness: awake and alert Pain management: pain level controlled Vital Signs Assessment: post-procedure vital signs reviewed and stable Respiratory status: spontaneous breathing, nonlabored ventilation, respiratory function stable and patient connected to nasal cannula oxygen Cardiovascular status: blood pressure returned to baseline and stable Postop Assessment: no apparent nausea or vomiting Anesthetic complications: no    Last Vitals:  Vitals:   10/02/19 1749 10/02/19 2044  BP: 121/79 (!) 142/84  Pulse: 87 85  Resp: 14 18  Temp: 36.7 C 36.6 C  SpO2: 99% 99%    Last Pain:  Vitals:   10/02/19 2044  TempSrc: Oral  PainSc:                  Catalina Gravel

## 2019-10-02 NOTE — Progress Notes (Signed)
Patient c/o 9/10 back pain. Patient states he took 10mg  Oxycodone @ 0400 & an additional 10mg  oxycodone @ 0500. Patient states he also took 1000 mg Tylenol @ 0500. Dr. Ola Spurr notified. Verbal order received for one dose 100 mcg Fentanyl IV. Fentanyl administered @ 1000. Patient placed on monitor. No acute distress noted.   10/02/19 1006  Vitals  Pulse Rate 94  Pulse Rate Source Monitor  Respiratory Pattern Regular  BP (!) 148/92  SpO2 96 %  Oxygen Therapy  O2 Device Room Air  Patient Activity (if Appropriate) In bed  Pain Assessment  Pain Scale 0-10  Pain Score 9  Pain Type Chronic pain  Pain Location Back  Pain Orientation Right;Left;Lower  Pain Descriptors / Indicators Aching;Sharp;Shooting;Stabbing  Pain Onset On-going  Patients Stated Pain Goal 7  Pain Intervention(s) Medication (See eMAR);MD notified (Comment);RN made aware  LOC  Level of Consciousness Alert  Orientation Level Oriented X4  Aldrete  Activity 2  Respiration 2  Circulation 2  Consciousness 2  Color 2  Aldrete Score 10

## 2019-10-02 NOTE — Transfer of Care (Signed)
Immediate Anesthesia Transfer of Care Note  Patient: [name]  Procedure(s) Performed: LUMBAR 4-SACRUM 1 POSTERIOR SPINAL FUSION WITH INSTRUMENTATION AND ALLOGRAFT WITH BONE MORPHOGENIC PROTEIN (N/A )  Patient Location: PACU  Anesthesia Type:General  Level of Consciousness: drowsy and patient cooperative  Airway & Oxygen Therapy: Patient Spontanous Breathing and Patient connected to nasal cannula oxygen  Post-op Assessment: Report given to RN and Post -op Vital signs reviewed and stable  Post vital signs: Reviewed and stable  Last Vitals:  Vitals Value Taken Time  BP 126/65 10/02/19 1548  Temp    Pulse 89 10/02/19 1549  Resp 14 10/02/19 1549  SpO2 95 % 10/02/19 1549  Vitals shown include unvalidated device data.  Last Pain:  Vitals:   10/02/19 1050  TempSrc:   PainSc: 5       Patients Stated Pain Goal: 7 ([id number])  Complications: No apparent anesthesia complications

## 2019-10-02 NOTE — Op Note (Signed)
PATIENT NAME: [name]   MEDICAL RECORD NO.:   [id number]   DATE OF BIRTH: [birth year]   DATE OF PROCEDURE: 10/02/2019                               OPERATIVE REPORT     PREOPERATIVE DIAGNOSES: 1.  L4-5, L5-S1 nonunion 2.  Chronic axial low back pain   POSTOPERATIVE DIAGNOSES: 1.  L4-5, L5-S1 nonunion 2.  Chronic axial low back pain   PROCEDURES: 1.  L4-5, L5-S1 posterior spinal fusion 2.  Placement of posterior segmental instrumentation L4, L5, S1, bilaterally 3.  Use of morselized allograft - Vivigen 4.  Use of bone morphogenic protein 5.  Intraoperative use of fluoroscopy   SURGEON:  Phylliss Bob, MD.   ASSISTANTPricilla Holm, PA-C.   ANESTHESIA:  General endotracheal anesthesia.   COMPLICATIONS:  None.   DISPOSITION:  Stable.   ESTIMATED BLOOD LOSS:  100cc   INDICATIONS FOR SURGERY:  Briefly,  [name] is a pleasant 49 year old male who did present to me with severe and ongoing pain in the low back. I did feel that the symptoms were likely secondary to the nonunion outlined above.  The patient failed conservative care and did wish to proceed with the procedure  noted above.   OPERATIVE DETAILS:  On 10/02/2019, the patient was brought to surgery and general endotracheal anesthesia was administered.  The patient was placed prone on a well-padded flat Jackson bed with a spinal frame.  Antibiotics were given and a time-out procedure was performed. The back was prepped and draped in the usual fashion.  At this point, paramedian incisions were made on the right and left sides just lateral to the L4, L5, and S1 pedicles. Electrocautery dissection was utilized to enter through the fascia, and the L4-5 and L5-S1 facet joints were subperiosteally exposed, as were the bilateral L4 and L5 transverse processes, and the sacral ala bilaterally.  At this point, using fluoroscopy, I did cannulate the L4 and L5 and S1 pedicles using a standard lateral to medial trajectory.   I did use a 6 mm tap to cannulate the pedicles.  A ball-tipped probe was used to confirm that there was no cortical violation.  The cannulated pedicle holes were filled with bone wax at this point.  At this point, a high-speed bur was used to decorticate the transverse processes at L4 and L5 bilaterally, as well as the sacral ala.  The facet joints were also decorticated.  At this point, collagen sponges soaked with bone morphogenic protein were placed along the posterior lateral gutters bilaterally.  Vivigen was then packed over the top of the collagen sponges.  At this point, I proceeded with placement of the pedicle screws.  7 x 45 mm screws were placed bilaterally at L4, L5, and S1.  Interconnecting rods were placed bilaterally, as were caps.  A final locking procedure was then performed.  I was very pleased with the final AP and lateral fluoroscopic images.  Prior to placing the bone morphogenic protein, the wound was copiously irrigated with approximately 3 L of normal saline.The wound was explored for any undue bleeding and there was no substantial bleeding encountered. The wounds were then closed in layers using #1 Vicryl followed by 2-0 Vicryl, followed by 4-0 Monocryl.  Benzoin and Steri-Strips were applied followed by sterile dressing.     Of note, did use triggered EMG to test the screws  bilaterally, and there was no screw the tested below 15 mA. There was no sustained abnormal EMG activity noted throughout the surgery.   Of note, Pricilla Holm was my assistant throughout surgery, and did aid in retraction, suctioning, placement of the hardware, the fusion, and closure.     Phylliss Bob, MD

## 2019-10-02 NOTE — H&P (Signed)
PREOPERATIVE H&P  Chief Complaint: Low back pain  HPI: [name] is a 49 y.o. male who presents with ongoing pain in the low back  CT scan reveals a nonunion at L4/5 and L5/S1  Patient has failed multiple forms of conservative care and continues to have pain (see office notes for additional details regarding the patient's full course of treatment)  Past Medical History:  Diagnosis Date  . Anxiety   . Arthritis    back area  . Blood dyscrasia    thrombocytopenia  15 months  . Chronic back pain   . Complication of anesthesia    noticed some memory loss after surgery 07/18/12  . Depression    on prozac  . Dysrhythmia    PAF in 2012, felt related to OSA--now on CPAP; PRN follow-up Dr. Lin Givens 03/2011  . GERD (gastroesophageal reflux disease)    uses baking soda  . Hyperlipidemia   . Hypertension    Dr. Allayne Butcher, [facility]  . Pancreatitis    hx of  . Pneumonia    hx of  2005ish  . Sleep apnea    sleep study approx 18 months ago, uses cpap  . Thrombocytopenia, idiopathic (HCC)    age of 38 months   Past Surgical History:  Procedure Laterality Date  . APPENDECTOMY     64 months of age and removed scar tissue  . BACK SURGERY     x2 fusions, L5-S1, L4-L5, a total of 6 per patient   . CHOLECYSTECTOMY    . DIAGNOSTIC LAPAROSCOPY     exploratory lab, abdominal  . HERNIA REPAIR Left    inguinal  . REFRACTIVE SURGERY Bilateral 98  . SACROILIAC JOINT FUSION Right 11/21/2013   Procedure: RIGHT SACROILIAC JOINT FUSION;  Surgeon: Sinclair Ship, MD;  Location: [place];  Service: Orthopedics;  Laterality: Right;  Right sided sacroiliac joint fusion  . SACROILIAC JOINT FUSION Left 01/22/2014   Procedure: SACROILIAC JOINT FUSION;  Surgeon: Sinclair Ship, MD;  Location: [place];  Service: Orthopedics;  Laterality: Left;  Left sided sacroiliac joint fusion  . SPINAL CORD STIMULATOR IMPLANT  07/18/2012  . SPINAL CORD STIMULATOR  INSERTION  07/18/2012   Procedure: LUMBAR SPINAL CORD STIMULATOR INSERTION;  Surgeon: Melina Schools, MD;  Location: [place];  Service: Orthopedics;  Laterality: N/A;  trial spinal cord stimulator implant   . SPINAL CORD STIMULATOR REMOVAL  07/26/2012   Procedure: LUMBAR SPINAL CORD STIMULATOR REMOVAL;  Surgeon: Melina Schools, MD;  Location: [place];  Service: Orthopedics;  Laterality: N/A;  REMOVAL OF TRIAL LEAD OR CONVERSION TO PERM SPINAL CORD STIMULATOR IMPLANT  . TONSILLECTOMY    . VASECTOMY     Social History   Socioeconomic History  . Marital status: Legally Separated    Spouse name: Not on file  . Number of children: 3  . Years of education: Not on file  . Highest education level: Not on file  Occupational History  . Not on file  Tobacco Use  . Smoking status: Current Some Day Smoker    Packs/day: 1.00    Years: 18.00    Pack years: 18.00    Types: Cigarettes  . Smokeless tobacco: Never Used  Substance and Sexual Activity  . Alcohol use: No  . Drug use: No  . Sexual activity: Yes  Other Topics Concern  . Not on file  Social History Narrative  . Not on file   Social Determinants of  Health   Financial Resource Strain:   . Difficulty of Paying Living Expenses: Not on file  Food Insecurity:   . Worried About Charity fundraiser in the Last Year: Not on file  . Ran Out of Food in the Last Year: Not on file  Transportation Needs:   . Lack of Transportation (Medical): Not on file  . Lack of Transportation (Non-Medical): Not on file  Physical Activity:   . Days of Exercise per Week: Not on file  . Minutes of Exercise per Session: Not on file  Stress:   . Feeling of Stress : Not on file  Social Connections:   . Frequency of Communication with Friends and Family: Not on file  . Frequency of Social Gatherings with Friends and Family: Not on file  . Attends Religious Services: Not on file  . Active Member of Clubs or Organizations: Not on file  . Attends Theatre manager Meetings: Not on file  . Marital Status: Not on file   Family History  Problem Relation Age of Onset  . Heart attack Mother   . Heart attack Brother   . Colon cancer Neg Hx   . Esophageal cancer Neg Hx    Allergies  Allergen Reactions  . Nexium [Esomeprazole Magnesium] Hives and Swelling  . Wellbutrin [Bupropion] Hives and Swelling   Prior to Admission medications   Medication Sig Start Date End Date Taking? Authorizing Provider  acetaminophen (TYLENOL) 500 MG tablet Take 1,000 mg by mouth every 6 (six) hours as needed for moderate pain.    Yes [provider]  amLODipine (NORVASC) 10 MG tablet Take 10 mg by mouth daily.   Yes [provider]  cyanocobalamin (,VITAMIN B-12,) 1000 MCG/ML injection Inject 1,000 mcg into the muscle every 14 (fourteen) days.  11/11/15  Yes [provider]  DEPO-TESTOSTERONE 200 MG/ML injection Inject 200 mg into the muscle every 14 (fourteen) days.  12/10/15  Yes [provider]  famotidine (PEPCID) 20 MG tablet Take 1 tablet (20 mg total) by mouth 2 (two) times daily. Patient taking differently: Take 20 mg by mouth 2 (two) times daily as needed for heartburn or indigestion.  06/13/19  Yes Cirigliano, Vito V, DO  fenofibrate 160 MG tablet Take 160 mg by mouth daily. 07/26/17  Yes [provider]  FLUoxetine (PROZAC) 10 MG capsule Take 10 mg by mouth daily.   Yes [provider]  ibuprofen (ADVIL) 200 MG tablet Take 400 mg by mouth every 4 (four) hours as needed for moderate pain.    Yes [provider]  Icosapent Ethyl (VASCEPA) 1 g CAPS Take 2 capsules by mouth 2 (two) times daily.   Yes [provider]  lisinopril (PRINIVIL,ZESTRIL) 20 MG tablet Take 1 tablet (20 mg total) by mouth at bedtime. Take an extra 1/2 = 10 mg in the evening Patient taking differently: Take 20 mg by mouth at bedtime.  10/12/17  Yes Richardo Priest, MD  methocarbamol (ROBAXIN) 750 MG tablet Take 375 mg by  mouth every 4 (four) hours as needed for muscle spasms.  02/28/17  Yes [provider]  montelukast (SINGULAIR) 10 MG tablet Take 10 mg by mouth at bedtime.   Yes [provider]  omeprazole (PRILOSEC) 20 MG capsule Take 20 mg by mouth daily.  04/01/19  Yes [provider]  Oxycodone HCl 10 MG TABS Take 10 mg by mouth every 4 (four) hours as needed (pain).  12/31/15  Yes [provider]     All other systems have been reviewed and were otherwise negative with the exception of those mentioned in the HPI and as above.  Physical Exam: There were no vitals filed for this visit.  There is no height or weight on file to calculate BMI.  General: Alert, no acute distress Cardiovascular: No pedal edema Respiratory: No cyanosis, no use of accessory musculature Skin: No lesions in the area of chief complaint Neurologic: Sensation intact distally Psychiatric: Patient is competent for consent with normal mood and affect Lymphatic: No axillary or cervical lymphadenopathy   Assessment/Plan: CHRONIC LOW BACK PAIN CAT SCAN NOTABLE FOR NONUNIONS AT LUMBAR 4-5 AND LUMBAR 5 - SACRUM 1 Plan for Procedure(s): LUMBAR 4-SACRUM 1 POSTERIOR SPINAL FUSION WITH INSTRUMENTATION AND ALLOGRAFT WITH BONE MORPHOGENIC PROTEIN   Norva Karvonen, MD 10/02/2019 6:47 AM

## 2019-10-02 NOTE — Progress Notes (Addendum)
Pt arrived on floor, transferred from PACU.  Patient is A&OX4 and in no acute distress. Pt states pain is 6/10, with no complaints of nausea. Lumbar dsg with gauze, c/d/i. Pt also has nerve stimulator with controller. Pt turned his stimulator on and reported no issues with it, then turned it off.  SCD's placed on patient lower extremieties & turned on. IS provided to patient and patient demonstrated usage of IS. Patient orienated to room and unit. Bedside table and personal belongings within reach of patient. Patient educated on usage of nurse call bell, placed within reach of patient. Patient instructed to call for assistance. Patient in bed. Will continue to monitor.

## 2019-10-02 NOTE — Plan of Care (Signed)

## 2019-10-02 NOTE — Anesthesia Preprocedure Evaluation (Addendum)
Anesthesia Evaluation  Patient identified by MRN, date of birth, ID band Patient awake    Reviewed: Allergy & Precautions, H&P , NPO status , Patient's Chart, lab work & pertinent test results  Airway Mallampati: III  TM Distance: >3 FB Neck ROM: Full    Dental no notable dental hx. (+) Teeth Intact, Dental Advisory Given   Pulmonary sleep apnea and Continuous Positive Airway Pressure Ventilation , Current Smoker and Patient abstained from smoking.,    Pulmonary exam normal breath sounds clear to auscultation       Cardiovascular hypertension, Pt. on medications + dysrhythmias Atrial Fibrillation  Rhythm:Regular Rate:Normal     Neuro/Psych Anxiety Depression negative neurological ROS     GI/Hepatic Neg liver ROS, GERD  Medicated and Controlled,  Endo/Other  negative endocrine ROS  Renal/GU negative Renal ROS  negative genitourinary   Musculoskeletal  (+) Arthritis , Osteoarthritis,    Abdominal   Peds  Hematology negative hematology ROS (+)   Anesthesia Other Findings   Reproductive/Obstetrics negative OB ROS                            Anesthesia Physical Anesthesia Plan  ASA: III  Anesthesia Plan: General   Post-op Pain Management:    Induction: Intravenous  PONV Risk Score and Plan: 2 and Ondansetron, Dexamethasone and Midazolam  Airway Management Planned: Oral ETT  Additional Equipment:   Intra-op Plan:   Post-operative Plan: Extubation in OR  Informed Consent: I have reviewed the patients History and Physical, chart, labs and discussed the procedure including the risks, benefits and alternatives for the proposed anesthesia with the patient or authorized representative who has indicated his/her understanding and acceptance.     Dental advisory given  Plan Discussed with: CRNA  Anesthesia Plan Comments:         Anesthesia Quick Evaluation

## 2019-10-02 NOTE — Anesthesia Procedure Notes (Signed)
Procedure Name: Intubation Date/Time: 10/02/2019 11:16 AM Performed by: Lance Coon, CRNA Pre-anesthesia Checklist: Patient identified, Emergency Drugs available, Suction available, Patient being monitored and Timeout performed Patient Re-evaluated:Patient Re-evaluated prior to induction Oxygen Delivery Method: Circle system utilized Preoxygenation: Pre-oxygenation with 100% oxygen Induction Type: IV induction Ventilation: Mask ventilation without difficulty Laryngoscope Size: Miller and 3 Grade View: Grade I Tube type: Oral Tube size: 7.5 mm Number of attempts: 1 Airway Equipment and Method: Stylet Placement Confirmation: ETT inserted through vocal cords under direct vision,  positive ETCO2 and breath sounds checked- equal and bilateral Secured at: 22 cm Tube secured with: Tape Dental Injury: Teeth and Oropharynx as per pre-operative assessment

## 2019-10-03 MED FILL — Thrombin (Recombinant) For Soln 20000 Unit: CUTANEOUS | Qty: 1 | Status: AC

## 2019-10-03 NOTE — Progress Notes (Signed)
    Patient doing well Reports moderate back pain Denies leg pain Patient has not been on feet since surgery, despite order as follows: "Mobilize POD 0 at least 1x per shift, POD 1 at least 2x per shift. First ambulation no later than 8am"   Physical Exam: Vitals:   10/02/19 2044 10/03/19 0053  BP: (!) 142/84 132/82  Pulse: 85 60  Resp: 18 18  Temp: 97.9 F (36.6 C) 98.1 F (36.7 C)  SpO2: 99% 98%    Dressing in place NVI  POD #1 s/p L4-S1 posterior spinal fusion, doing well, but he needs to mobilize  - up with PT/OT, encourage ambulation - Percocet for pain, Robaxin for muscle spasms - likely d/c home today with f/u in 2 weeks  - patient needs to mobilize per yesterday's order, which was not followed: Mobilize POD 0 at least 1x per shift, POD 1 at least 2x per shift. First ambulation no later than 8am"

## 2019-10-03 NOTE — Progress Notes (Signed)
Physical Therapy Treatment Patient Details Name: [name] MRN: [id number] DOB: April 02, 1971 Today's Date: 10/03/2019    History of Present Illness Pt is a 49 y.o. M s/p L4-S1 posterior spinal fusion 2/24. Significant PMH of prior spinal surgeries.     PT Comments    Pt seen for additional session to progress mobility. Pt with improved pain control in comparison to this morning (pre-medicated) and with resulting improved activity tolerance. Ambulating 150 feet with a walker at a min guard assist level. Still with moderate reliance through arms on walker. Hopeful that by tomorrow with continued pain control/management pt will be able to discharge home.     Follow Up Recommendations  Home health PT;Supervision for mobility/OOB     Equipment Recommendations  Rolling walker with 5" wheels;3in1 (PT)    Recommendations for Other Services       Precautions / Restrictions Precautions Precautions: Back Precaution Booklet Issued: Yes (comment) Required Braces or Orthoses: Spinal Brace Spinal Brace: Thoracolumbosacral orthotic;Applied in sitting position Restrictions Weight Bearing Restrictions: No    Mobility  Bed Mobility Overal bed mobility: Needs Assistance Bed Mobility: Rolling;Sidelying to Sit Rolling: Supervision Sidelying to sit: Supervision       General bed mobility comments: Cues for log roll technique, heavy use of bed rail. Increased time/effort  Transfers Overall transfer level: Needs assistance Equipment used: Rolling walker (2 wheeled) Transfers: Sit to/from Stand Sit to Stand: Min guard            Ambulation/Gait Ambulation/Gait assistance: Min guard Gait Distance (Feet): 150 Feet Assistive device: Rolling walker (2 wheeled) Gait Pattern/deviations: Step-through pattern;Decreased stride length Gait velocity: decreased   General Gait Details: Cues for hip extension, upper trunk control   Stairs             Wheelchair Mobility     Modified Rankin (Stroke Patients Only)       Balance Overall balance assessment: Needs assistance Sitting-balance support: Feet supported Sitting balance-Leahy Scale: Good     Standing balance support: Bilateral upper extremity supported Standing balance-Leahy Scale: Poor Standing balance comment: reliant on external support                            Cognition Arousal/Alertness: Awake/alert Behavior During Therapy: WFL for tasks assessed/performed Overall Cognitive Status: Within Functional Limits for tasks assessed                                        Exercises      General Comments        Pertinent Vitals/Pain Pain Assessment: Faces Faces Pain Scale: Hurts even more Pain Location: surgical site with radicular pain Pain Descriptors / Indicators: Grimacing;Operative site guarding;Other (Comment)(diaphoretic) Pain Intervention(s): Limited activity within patient's tolerance;Monitored during session;Premedicated before session;Repositioned;Ice applied    Home Living                      Prior Function            PT Goals (current goals can now be found in the care plan section) Acute Rehab PT Goals Patient Stated Goal: "go home today." PT Goal Formulation: With patient Time For Goal Achievement: 10/17/19 Potential to Achieve Goals: Good Progress towards PT goals: Progressing toward goals    Frequency    Min 5X/week      PT Plan Current  plan remains appropriate    Co-evaluation              AM-PAC PT "6 Clicks" Mobility   Outcome Measure  Help needed turning from your back to your side while in a flat bed without using bedrails?: None Help needed moving from lying on your back to sitting on the side of a flat bed without using bedrails?: A Little Help needed moving to and from a bed to a chair (including a wheelchair)?: A Little Help needed standing up from a chair using your arms (e.g., wheelchair or  bedside chair)?: A Little Help needed to walk in hospital room?: A Little Help needed climbing 3-5 steps with a railing? : A Lot 6 Click Score: 18    End of Session Equipment Utilized During Treatment: Gait belt;Back brace Activity Tolerance: Patient tolerated treatment well Patient left: with call bell/phone within reach;in bed Nurse Communication: Mobility status;Patient requests pain meds PT Visit Diagnosis: Pain;Difficulty in walking, not elsewhere classified (R26.2) Pain - part of body: (back)     Time: [id number] PT Time Calculation (min) (ACUTE ONLY): 20 min  Charges:  $Gait Training: 8-22 mins                       Wyona Almas, PT, DPT Acute Rehabilitation Services Pager [phone] Office [phone]    Deno Etienne 10/03/2019, 4:44 PM

## 2019-10-03 NOTE — Progress Notes (Signed)
Patient states he doesn't know if he should go home. He said his pain is a 9 out of 10 most of the time.

## 2019-10-03 NOTE — Evaluation (Signed)
Occupational Therapy Evaluation Patient Details Name: [name] MRN: [id number] DOB: [birth year] Today's Date: 10/03/2019    History of Present Illness Pt is a 49 y.o. M s/p L4-S1 posterior spinal fusion 2/24. Significant PMH of prior spinal surgeries.    Clinical Impression   Pt admitted with the above diagnosis and has the deficits listed below. Pt would benefit from cont OT to address increasing independence with basic adls and adl transfers. Pt very limited right now due to pain. Pt unable to tolerate walking to bathroom and trying adls due to dizziness in standing, nausea and pain.  Pt lives alone.  Pt has people that can check in on him but nobody that can be with him 24/7.  In current state, pt needs someone 24/7.  Will continue working with pt to address LE adls, donning brace and mobility in the bathroom with the walker.  If pain can be controlled, feel pt could eventually d/c home with [place].    Follow Up Recommendations  Home health OT;Supervision/Assistance - 24 hour    Equipment Recommendations  3 in 1 bedside commode    Recommendations for Other Services       Precautions / Restrictions Precautions Precautions: Back Precaution Booklet Issued: Yes (comment) Required Braces or Orthoses: Spinal Brace Spinal Brace: Thoracolumbosacral orthotic;Applied in sitting position Restrictions Weight Bearing Restrictions: No      Mobility Bed Mobility Overal bed mobility: Needs Assistance Bed Mobility: Rolling;Sidelying to Sit Rolling: Supervision Sidelying to sit: Min guard       General bed mobility comments: Cues for log roll technique, heavy use of bed rail. Increased time/effort  Transfers Overall transfer level: Needs assistance Equipment used: Rolling walker (2 wheeled) Transfers: Sit to/from Stand Sit to Stand: Min guard         General transfer comment: Pt able to move and pivot but in pain. Pt very nauseous and dizzy with standing.    Balance  Overall balance assessment: Needs assistance Sitting-balance support: Feet supported Sitting balance-Leahy Scale: Good     Standing balance support: Bilateral upper extremity supported Standing balance-Leahy Scale: Poor Standing balance comment: reliant on external support                           ADL either performed or assessed with clinical judgement   ADL Overall ADL's : Needs assistance/impaired Eating/Feeding: Set up;Sitting   Grooming: Wash/dry hands;Wash/dry face;Oral care;Set up;Cueing for UE precautions   Upper Body Bathing: Minimal assistance;Sitting   Lower Body Bathing: Total assistance;Sit to/from stand;Cueing for compensatory techniques;Cueing for back precautions   Upper Body Dressing : Maximal assistance;Sitting;Cueing for compensatory techniques Upper Body Dressing Details (indicate cue type and reason): full assist with brace at this time. pt unable to tolerate sitting more than 20 minutes.  Unable to donn bace on own.  Lower Body Dressing: Total assistance;Cueing for compensatory techniques;Cueing for back precautions;Sit to/from stand Lower Body Dressing Details (indicate cue type and reason): Pt unable to cross legs to dress or use adaptive equipment to dress at this time due to pain.   Toilet Transfer: Minimal Systems analyst Details (indicate cue type and reason): Pt pivoted to [facility] but has great pain coming to full sit before standing moaning with all movements.  Toileting- Clothing Manipulation and Hygiene: Total assistance;Sit to/from stand;Cueing for compensatory techniques Toileting - Clothing Manipulation Details (indicate cue type and reason): Pt unable to clean self after a bowel movement at this time. Pt cannot  reach behind him with brace on with current pain level for hygeine.     Functional mobility during ADLs: Minimal assistance;Rolling walker;Cueing for safety General ADL Comments: Pt very limited by pain.  Pt could only focus on getting back into the bed and getting ice on incision.       Vision Baseline Vision/History: No visual deficits Patient Visual Report: No change from baseline Vision Assessment?: No apparent visual deficits     Perception Perception Perception Tested?: No   Praxis Praxis Praxis tested?: Not tested    Pertinent Vitals/Pain Pain Assessment: Faces Faces Pain Scale: Hurts whole lot Pain Location: surgical site with radicular pain Pain Descriptors / Indicators: Grimacing;Operative site guarding;Other (Comment)(diaphoretic) Pain Intervention(s): Limited activity within patient's tolerance;Monitored during session;Premedicated before session;Patient requesting pain meds-RN notified;Repositioned     Hand Dominance     Extremity/Trunk Assessment Upper Extremity Assessment Upper Extremity Assessment: Overall WFL for tasks assessed   Lower Extremity Assessment Lower Extremity Assessment: RLE deficits/detail;LLE deficits/detail RLE Deficits / Details: Strength 5/5 LLE Deficits / Details: Strength 5/5   Cervical / Trunk Assessment Cervical / Trunk Assessment: Other exceptions Cervical / Trunk Exceptions: s/p spinal fusion   Communication Communication Communication: No difficulties   Cognition Arousal/Alertness: Awake/alert Behavior During Therapy: WFL for tasks assessed/performed Overall Cognitive Status: Within Functional Limits for tasks assessed                                     General Comments  Pt most limited by pain.    Exercises     Shoulder Instructions      Home Living Family/patient expects to be discharged to:: Private residence Living Arrangements: Children(11 y.o.) Available Help at Discharge: Family;Available PRN/intermittently(has older son in 43's) Type of Home: Apartment Home Access: Level entry     Home Layout: One level     Bathroom Shower/Tub: Tub/shower unit         Home Equipment: None           Prior Functioning/Environment Level of Independence: Independent                 OT Problem List: Decreased activity tolerance;Impaired balance (sitting and/or standing);Decreased knowledge of use of DME or AE;Decreased knowledge of precautions;Pain      OT Treatment/Interventions: Self-care/ADL training;Therapeutic activities;Balance training;DME and/or AE instruction    OT Goals(Current goals can be found in the care plan section) Acute Rehab OT Goals Patient Stated Goal: "go home today." OT Goal Formulation: With patient Time For Goal Achievement: 10/17/19 Potential to Achieve Goals: Good ADL Goals Pt Will Perform Grooming: with supervision;standing Pt Will Perform Upper Body Dressing: with min assist;sitting Pt Will Perform Lower Body Dressing: with min assist;with adaptive equipment Pt Will Perform Tub/Shower Transfer: Shower transfer;with supervision;ambulating;shower seat Additional ADL Goal #1: Pt will walk to bathroom with walker and toilet with 3:1 over commode all with supervision.  OT Frequency: Min 3X/week   Barriers to D/C: Decreased caregiver support  Pt states he does not have someone that can be there 24/7.       Co-evaluation              AM-PAC OT "6 Clicks" Daily Activity     Outcome Measure Help from another person eating meals?: None Help from another person taking care of personal grooming?: A Little Help from another person toileting, which includes using toliet, bedpan, or urinal?: A Lot Help from  another person bathing (including washing, rinsing, drying)?: A Lot Help from another person to put on and taking off regular upper body clothing?: A Lot Help from another person to put on and taking off regular lower body clothing?: A Lot 6 Click Score: 15   End of Session Equipment Utilized During Treatment: Back brace;Rolling walker Nurse Communication: Mobility status  Activity Tolerance: Patient limited by pain Patient left: in bed;with  call bell/phone within reach;with bed alarm set  OT Visit Diagnosis: Unsteadiness on feet (R26.81);Pain;Other abnormalities of gait and mobility (R26.89) Pain - part of body: (back)                Time: [id number] OT Time Calculation (min): 18 min Charges:  OT General Charges $OT Visit: 1 Visit OT Evaluation $OT Eval Moderate Complexity: 1 Mod  Glenford Peers 10/03/2019, 10:16 AM

## 2019-10-03 NOTE — Evaluation (Signed)
Physical Therapy Evaluation Patient Details Name: [name] MRN: [id number] DOB: June 11, 1971 Today's Date: 10/03/2019   History of Present Illness  Pt is a 49 y.o. M s/p L4-S1 posterior spinal fusion 2/24. Significant PMH of prior spinal surgeries.   Clinical Impression  Pt admitted s/p procedure listed above. Pt reports significant back pain with radicular symptoms into bilateral lower extremities. He also states he has not attempted out of bed mobility since surgery yesterday 2/24. Upon standing, pt with diaphoresis and nausea, requesting to sit back down. Ultimately able to ambulate x 3 feet to chair with use of walker. Education provided regarding spinal precautions and activity recommendations. Will continue to progress mobility as tolerated. See below for recommendations.     Follow Up Recommendations Home health PT;Supervision for mobility/OOB    Equipment Recommendations  Rolling walker with 5" wheels;3in1 (PT)    Recommendations for Other Services       Precautions / Restrictions Precautions Precautions: Back Precaution Booklet Issued: Yes (comment) Required Braces or Orthoses: Spinal Brace Spinal Brace: Thoracolumbosacral orthotic;Applied in sitting position Restrictions Weight Bearing Restrictions: No      Mobility  Bed Mobility Overal bed mobility: Needs Assistance Bed Mobility: Rolling;Sidelying to Sit Rolling: Supervision Sidelying to sit: Min guard       General bed mobility comments: Cues for log roll technique, heavy use of bed rail. Increased time/effort  Transfers Overall transfer level: Needs assistance Equipment used: Rolling walker (2 wheeled) Transfers: Sit to/from Stand Sit to Stand: Min guard            Ambulation/Gait Ambulation/Gait assistance: Min guard Gait Distance (Feet): 3 Feet Assistive device: Rolling walker (2 wheeled) Gait Pattern/deviations: Step-through pattern;Decreased stride length Gait velocity: decreased    General Gait Details: Pt with heavily guarded gait pattern, moderate reliance through arms on walker  Stairs            Wheelchair Mobility    Modified Rankin (Stroke Patients Only)       Balance Overall balance assessment: Needs assistance Sitting-balance support: Feet supported Sitting balance-Leahy Scale: Good     Standing balance support: Bilateral upper extremity supported Standing balance-Leahy Scale: Poor Standing balance comment: reliant on external support                             Pertinent Vitals/Pain Pain Assessment: Faces Faces Pain Scale: Hurts whole lot Pain Location: surgical site with radicular pain Pain Descriptors / Indicators: Grimacing;Operative site guarding;Other (Comment)(diaphoretic) Pain Intervention(s): Limited activity within patient's tolerance;Monitored during session;Premedicated before session;Patient requesting pain meds-RN notified;Repositioned    Home Living Family/patient expects to be discharged to:: Private residence Living Arrangements: Children(11 y.o.) Available Help at Discharge: Family;Available PRN/intermittently(has older son in 74's) Type of Home: Apartment Home Access: Level entry     Home Layout: One level Home Equipment: None      Prior Function Level of Independence: Independent               Hand Dominance        Extremity/Trunk Assessment   Upper Extremity Assessment Upper Extremity Assessment: Overall WFL for tasks assessed    Lower Extremity Assessment Lower Extremity Assessment: RLE deficits/detail;LLE deficits/detail RLE Deficits / Details: Strength 5/5 LLE Deficits / Details: Strength 5/5    Cervical / Trunk Assessment Cervical / Trunk Assessment: Other exceptions Cervical / Trunk Exceptions: s/p spinal fusion  Communication   Communication: No difficulties  Cognition Arousal/Alertness: Awake/alert Behavior During Therapy:  WFL for tasks assessed/performed Overall  Cognitive Status: Within Functional Limits for tasks assessed                                        General Comments      Exercises     Assessment/Plan    PT Assessment Patient needs continued PT services  PT Problem List Decreased activity tolerance;Decreased mobility;Decreased balance;Pain       PT Treatment Interventions DME instruction;Gait training;Functional mobility training;Therapeutic activities;Therapeutic exercise;Balance training;Patient/family education    PT Goals (Current goals can be found in the Care Plan section)  Acute Rehab PT Goals Patient Stated Goal: "go home today." PT Goal Formulation: With patient Time For Goal Achievement: 10/17/19 Potential to Achieve Goals: Good    Frequency Min 5X/week   Barriers to discharge Decreased caregiver support      Co-evaluation               AM-PAC PT "6 Clicks" Mobility  Outcome Measure Help needed turning from your back to your side while in a flat bed without using bedrails?: None Help needed moving from lying on your back to sitting on the side of a flat bed without using bedrails?: A Little Help needed moving to and from a bed to a chair (including a wheelchair)?: A Little Help needed standing up from a chair using your arms (e.g., wheelchair or bedside chair)?: A Little Help needed to walk in hospital room?: A Little Help needed climbing 3-5 steps with a railing? : A Lot 6 Click Score: 18    End of Session Equipment Utilized During Treatment: Gait belt;Back brace Activity Tolerance: Patient limited by pain Patient left: in chair;with call bell/phone within reach Nurse Communication: Mobility status;Patient requests pain meds PT Visit Diagnosis: Pain;Difficulty in walking, not elsewhere classified (R26.2) Pain - part of body: (back)    Time: [id number] PT Time Calculation (min) (ACUTE ONLY): 22 min   Charges:   PT Evaluation $PT Eval Low Complexity: [place], PT, DPT Acute Rehabilitation Services Pager [phone] Office [phone]   Deno Etienne 10/03/2019, 9:23 AM

## 2019-10-04 NOTE — Progress Notes (Signed)
I went to patient and informed him that he needed to ambulate in hallway per MD orders.  Patient refused stating, "It is not going to happen until I can get more pain medication."  Patient educated on reason for ambulation and risks of not doing so.  He replied well I am not going to let you torture me like that.  I informed patient it was not time for his Oxycodone until 7am.  Understanding verbalized.

## 2019-10-04 NOTE — Plan of Care (Signed)
  Problem: Health Behavior/Discharge Planning: Goal: Ability to manage health-related needs will improve Note: Received discharge order for patient. Discharge instructions given to patient. Patient with no further questions at this time. Pt stable in no acute distress. PIV removed, intact. Pt off unit in wheelchair. Nursing care complete.

## 2019-10-04 NOTE — Progress Notes (Signed)
    Patient doing well but reports strong desire to D/C home. He states he has been in communication with his pain management team and they have a plan in place for D/C meds and he is eager to go home to his own bed. He has been up and walking and feels comfortable getting around his home. He has strong friend support and will be set up with [facility]. He understands pain control is more difficult for him secondary to his history of narcotic use long term.    Physical Exam: Vitals:   10/04/19 0100 10/04/19 0805  BP:  116/74  Pulse: (!) 102 (!) 114  Resp: 20 20  Temp: 99.7 F (37.6 C) 99 F (37.2 C)  SpO2: 95% 96%    Dressing in place, CDI, pt laying comfortably in bed, TLSO brace at bedside NVI  POD #2 s/p L4-S1 PSF, doing well, eager to progress home   - up with PT/OT, encourage ambulation - Pain meds will be controlled by Px Mngmt upon D/C - likely d/c home today with f/u in 2 weeks - Home Health to be ordered and established

## 2019-10-04 NOTE — Progress Notes (Signed)
Occupational Therapy Treatment Patient Details Name: [name] MRN: [id number] DOB: 05/20/1971 Today's Date: 10/04/2019    History of present illness Pt is a 49 y.o. M s/p L4-S1 posterior spinal fusion 2/24. Significant PMH of prior spinal surgeries.    OT comments  Pt reports having just gotten back in bed from walking with nursing upon OT arrival. Pt declined OOB mobility but agreeable to education related to LB AE for bathing and dressing and maintaining back precautions during ADL routine. Pt states this is his "seventh back surgery" and is aware of AE. Pt able to verbalize 2/3 precautions as well as wearing schedule for brace and log roll technique. Education provided on uses of 3n1 at home with pt stating he is interested in shower seat for tub shower. DC plan currently remains appropriate, will follow acutely for OT needs.   Follow Up Recommendations  Home health OT;Supervision/Assistance - 24 hour    Equipment Recommendations  3 in 1 bedside commode    Recommendations for Other Services      Precautions / Restrictions Precautions Precautions: Back Precaution Booklet Issued: Yes (comment) Precaution Comments: pt able to state 2/3 precautions Required Braces or Orthoses: Spinal Brace Spinal Brace: Thoracolumbosacral orthotic;Applied in sitting position Restrictions Weight Bearing Restrictions: No       Mobility Bed Mobility               General bed mobility comments: pt able to verablize log roll technique but declined practicing  Transfers                 General transfer comment: pt declined OOB txfr    Balance                                           ADL either performed or assessed with clinical judgement   ADL Overall ADL's : Needs assistance/impaired       Grooming Details (indicate cue type and reason): verbally reviewed compensatory strategies for ADLs such as 2 cup method and using wash cloth vs leaning forward  to wash face; pt declined OOB actiivty       Lower Body Bathing Details (indicate cue type and reason): education provided on LB bathing with LH sponge   Upper Body Dressing Details (indicate cue type and reason): pt able to verbalize brace wearing schedule but declined practicing donning from EOB   Lower Body Dressing Details (indicate cue type and reason): education initiated on all LB AE with pt stating "this is my 7th back surgery"   Toilet Transfer Details (indicate cue type and reason): pt declined OOB txfr   Toileting - Clothing Manipulation Details (indicate cue type and reason): education provided on maintaining back precautions during posterior pericare   Tub/Shower Transfer Details (indicate cue type and reason): pt reports tub showerl requesting seat   General ADL Comments: pt reports having just got back into bed; session focus on education related to LB AE for bathing and dressing and maintaining back precautions during ADL routine     Vision       Perception     Praxis      Cognition Arousal/Alertness: Awake/alert Behavior During Therapy: WFL for tasks assessed/performed Overall Cognitive Status: Within Functional Limits for tasks assessed  Exercises     Shoulder Instructions       General Comments pt limited by pain this session    Pertinent Vitals/ Pain       Pain Assessment: Faces Faces Pain Scale: Hurts whole lot Pain Location: back Pain Descriptors / Indicators: Discomfort;Grimacing Pain Intervention(s): Monitored during session;RN gave pain meds during session  Home Living                                          Prior Functioning/Environment              Frequency  Min 3X/week        Progress Toward Goals  OT Goals(current goals can now be found in the care plan section)  Progress towards OT goals: Progressing toward goals  Acute Rehab OT  Goals Patient Stated Goal: "go home today." OT Goal Formulation: With patient Time For Goal Achievement: 10/17/19 Potential to Achieve Goals: Good  Plan Discharge plan remains appropriate    Co-evaluation                 AM-PAC OT "6 Clicks" Daily Activity     Outcome Measure   Help from another person eating meals?: None Help from another person taking care of personal grooming?: A Little Help from another person toileting, which includes using toliet, bedpan, or urinal?: A Lot Help from another person bathing (including washing, rinsing, drying)?: A Lot Help from another person to put on and taking off regular upper body clothing?: A Lot Help from another person to put on and taking off regular lower body clothing?: A Lot 6 Click Score: 15    End of Session Equipment Utilized During Treatment: Other (comment)(LB AE)  OT Visit Diagnosis: Unsteadiness on feet (R26.81);Pain;Other abnormalities of gait and mobility (R26.89)   Activity Tolerance Patient limited by pain   Patient Left in bed;with call bell/phone within reach;with nursing/sitter in room   Nurse Communication Mobility status        Time: [id number] OT Time Calculation (min): 12 min  Charges: OT General Charges $OT Visit: 1 Visit OT Treatments $Self Care/Home Management : 8-22 mins  Lanier Clam., COTA/L Acute Rehabilitation Services [phone]    Ihor Gully 10/04/2019, 10:31 AM

## 2019-10-04 NOTE — TOC Transition Note (Addendum)
Transition of Care [facility]) - CM/SW Discharge Note   Patient Details  Name: [name] MRN: [id number] Date of Birth: [birth year]  Transition of Care [facility]) CM/SW Contact:  Curlene Labrum, RN Phone Number: 10/04/2019, 12:03 PM   Clinical Narrative:     Case Manager saw the patient prior to discharge to discussion patient's transition to go home this morning.  Patient is recently separated and lives alone with his 60 year old son.  The patient is looking forward to going home today and transportation will be provided by his older son, [name], who is 71 years old.  The patient recently moved to a new apartment in [place], [place] and was updated in the demographics in the chart.  Patient currently uses [facility] but patient states that Dr. Lynann Bologna may not be ordering any new discharge medications.  Patient currently sees Dr. Humphrey Rolls as his primary care physician.    Patient currently has a lightweight rolling walker with a seat at home.  The PT/OT recommended a rolling walker and 3:1 and this is being provided to the patient through [employer] in the patient's room before discharge.  Patient was given choice in the provider of home health providers.  Patient selected 3 top providers including [facility], [facility], and [facility].  [facility] was unable to schedule the patient at this point.  Spoke with intake provider at [facility] and they would send the patient's information to the intake intake nurse regarding patient's acceptance.   All notes and documents electronically sent to [facility].  [facility] was accepted as his provider PT/OT therapy.  [facility] called patient set up for PT/OT that will start the beginning of the week based on staffing. No other transitions of care needs identified at this time.  Patient to be discharged home today.  10/04/2019 [place] returned call back and decided that they would  be unable to provide PT/OT services to the patient.  [facility] and spoke with Caryl Pina to arrange PT/OT therapies as ordered.   Final next level of care: [place] Barriers to Discharge: No Barriers Identified   Patient Goals and CMS Choice Patient states their goals for this hospitalization and ongoing recovery are:: Patient states he is looking forward to healing and feeling better after his surgery. CMS Medicare.gov Compare Post Acute Care list provided to:: Patient Choice offered to / list presented to : Patient  Discharge Placement                       Discharge Plan and Services   Discharge Planning Services: CM Consult Post Acute Care Choice: Durable Medical Equipment, Home Health          DME Arranged: Walker rolling, 3-N-1 DME Agency: [facility] Date DME Agency Contacted: 10/04/19 Time DME Agency Contacted: 13 Representative spoke with at DME Agency: [place]: PT, OT [place] Agency: [place] Date [place]: 10/04/19 Time [place]: 1115 Representative spoke with at [place] with [facility]  Social Determinants of Health (SDOH) Interventions     Readmission Risk Interventions Readmission Risk Prevention Plan 10/04/2019  Post Dischage Appt Complete  Medication Screening Complete  Transportation Screening Complete  Some recent data might be hidden

## 2019-10-04 NOTE — Progress Notes (Addendum)
Physical Therapy Treatment Patient Details Name: [name] MRN: [id number] DOB: 1971-05-23 Today's Date: 10/04/2019    History of Present Illness Pt is a 49 y.o. M s/p L4-S1 posterior spinal fusion 2/24. Significant PMH of prior spinal surgeries.     PT Comments    Pt with improved pain control and activity tolerance today. Ambulating 200 feet with a walker at a supervision level; still with moderate reliance through arms. Education reinforced regarding activity recommendations and progression, cryotherapy, spinal precautions. D/c plan remains appropriate.     Follow Up Recommendations  Home health PT;Supervision for mobility/OOB     Equipment Recommendations  Rolling walker with 5" wheels;3in1 (PT)    Recommendations for Other Services       Precautions / Restrictions Precautions Precautions: Back Precaution Booklet Issued: Yes (comment) Spinal Brace: Thoracolumbosacral orthotic;Applied in sitting position Restrictions Weight Bearing Restrictions: No    Mobility  Bed Mobility Overal bed mobility: Modified Independent             General bed mobility comments: demonstrates good log roll technique  Transfers Overall transfer level: Needs assistance Equipment used: Rolling walker (2 wheeled) Transfers: Sit to/from Stand Sit to Stand: Supervision            Ambulation/Gait Ambulation/Gait assistance: Supervision Gait Distance (Feet): 200 Feet Assistive device: Rolling walker (2 wheeled) Gait Pattern/deviations: Step-through pattern;Decreased stride length;Trunk flexed Gait velocity: decreased   General Gait Details: Cues for hip extension, scapular depression   Stairs             Wheelchair Mobility    Modified Rankin (Stroke Patients Only)       Balance Overall balance assessment: Needs assistance Sitting-balance support: Feet supported Sitting balance-Leahy Scale: Good     Standing balance support: Bilateral upper extremity  supported Standing balance-Leahy Scale: Poor Standing balance comment: reliant on external support                            Cognition Arousal/Alertness: Awake/alert Behavior During Therapy: WFL for tasks assessed/performed Overall Cognitive Status: Within Functional Limits for tasks assessed                                        Exercises      General Comments        Pertinent Vitals/Pain Pain Assessment: Faces Faces Pain Scale: Hurts little more Pain Location: back Pain Descriptors / Indicators: Discomfort;Grimacing Pain Intervention(s): Monitored during session    Home Living                      Prior Function            PT Goals (current goals can now be found in the care plan section) Acute Rehab PT Goals Patient Stated Goal: "go home today." Potential to Achieve Goals: Good Progress towards PT goals: Progressing toward goals    Frequency    Min 5X/week      PT Plan Current plan remains appropriate    Co-evaluation              AM-PAC PT "6 Clicks" Mobility   Outcome Measure  Help needed turning from your back to your side while in a flat bed without using bedrails?: None Help needed moving from lying on your back to sitting on the side of a flat bed  without using bedrails?: None Help needed moving to and from a bed to a chair (including a wheelchair)?: None Help needed standing up from a chair using your arms (e.g., wheelchair or bedside chair)?: None Help needed to walk in hospital room?: None Help needed climbing 3-5 steps with a railing? : A Little 6 Click Score: 23    End of Session Equipment Utilized During Treatment: Gait belt;Back brace Activity Tolerance: Patient tolerated treatment well Patient left: in bed;with call bell/phone within reach Nurse Communication: Mobility status PT Visit Diagnosis: Pain;Difficulty in walking, not elsewhere classified (R26.2)     Time: [id number] PT Time  Calculation (min) (ACUTE ONLY): 11 min  Charges:  $Gait Training: 8-22 mins                       Wyona Almas, PT, DPT Acute Rehabilitation Services Pager [phone] Office [phone]    Deno Etienne 10/04/2019, 4:00 PM

## 2019-10-07 ENCOUNTER — Encounter: Payer: Self-pay | Admitting: *Deleted

## 2019-10-07 ENCOUNTER — Other Ambulatory Visit: Payer: Self-pay | Admitting: *Deleted

## 2019-10-07 DIAGNOSIS — F329 Major depressive disorder, single episode, unspecified: Secondary | ICD-10-CM | POA: Diagnosis not present

## 2019-10-07 DIAGNOSIS — I10 Essential (primary) hypertension: Secondary | ICD-10-CM | POA: Diagnosis not present

## 2019-10-07 DIAGNOSIS — G4730 Sleep apnea, unspecified: Secondary | ICD-10-CM | POA: Diagnosis not present

## 2019-10-07 DIAGNOSIS — M960 Pseudarthrosis after fusion or arthrodesis: Secondary | ICD-10-CM | POA: Diagnosis not present

## 2019-10-07 DIAGNOSIS — Z981 Arthrodesis status: Secondary | ICD-10-CM | POA: Diagnosis not present

## 2019-10-07 DIAGNOSIS — Z4789 Encounter for other orthopedic aftercare: Secondary | ICD-10-CM | POA: Diagnosis not present

## 2019-10-07 DIAGNOSIS — M48061 Spinal stenosis, lumbar region without neurogenic claudication: Secondary | ICD-10-CM | POA: Diagnosis not present

## 2019-10-07 DIAGNOSIS — F419 Anxiety disorder, unspecified: Secondary | ICD-10-CM | POA: Diagnosis not present

## 2019-10-07 DIAGNOSIS — F17210 Nicotine dependence, cigarettes, uncomplicated: Secondary | ICD-10-CM | POA: Diagnosis not present

## 2019-10-07 DIAGNOSIS — E785 Hyperlipidemia, unspecified: Secondary | ICD-10-CM | POA: Diagnosis not present

## 2019-10-07 NOTE — Patient Outreach (Addendum)
[name]) Care Management  10/07/2019  [name] 21-Nov-1970 [id number]   Transition of care call  Referral received:09/30/19 Initial outreach:10/02/19 Insurance: [place]   Subjective: Initial successful telephone call to patient's preferred number in order to complete transition of care assessment; 2 HIPAA identifiers verified. Explained purpose of call and completed transition of care assessment.  [name] states he is doing better now, reports having a rough night due to back spasms. He reports pain controlled with prescribed medication, reports he is followed by [place] pain management for prescribed medications for pain control. He reports surgical incision area with dressing in place no signs of infection has been observed by home health PT, states his exwife a wound nurse has reviewed site. He discussed having a sore area  at right lower abdominal area, that he plans to notify MD office of , and questions if it is a hernia area.  He tolerating diet without complaint of nausea denies temperature elevation bowel or bladder problems. Patient reports that his  32 year old son was staying with him prior to surgery his 81 year old son able to provide transportation, he has his sister, neighbors providing meal support, states no one staying with him at present but visits.   He reports tolerating mobility in home using rolling walker and wearing brace, verbalized receiving back precaution instructions/handout  by hospital therapist . He is able to sponge bath, will have someone present when taking shower.  Patient discussed having initial home health therapy session on this am with [name] . He discussed this was his 7th back surgery. He discussed  ongoing health issues of hypertension  and says he is managing it well  does not need a referral to one of the [place] chronic disease management programs.  They do not have  hospital indemnity plan.   He uses a [facility] outpatient pharmacy, at [employer]     Objective:  [name]  was hospitalized at [facility] 2/24-2/26/21 for Posterior spinal fusion Comorbidities include: Low back pain, hypertension, depression . He was discharged to home on 2/26/21with health services for physical and occupational therapy with [facility] services and received, rolling walker and 3 in 1 for DME.   Assessment:  Patient voices good understanding of all discharge instructions. Will call surgeon office to schedule post discharge visit.  See transition of care flowsheet for assessment details.   Plan:  Reviewed hospital discharge diagnosis of Posterior spinal fusion    and discharge treatment plan using hospital discharge instructions, assessing medication adherence, reviewing problems requiring provider notification, and discussing the importance of follow up with surgeon, primary care provider and/or specialists as directed.   Will plan follow up call to patient in the next 4 days to follow up on patient being able to schedule post discharge visit. Will route successful outreach letter with [place] Management pamphlet and 24 Hour Nurse Line Magnet to [place] Management clinical pool to be mailed to patient's home address.   Joylene Draft, RN, BSN  [place] Management Coordinator  [phone]- Mobile [phone]- Toll Free Main Office

## 2019-10-09 NOTE — Discharge Summary (Signed)
Patient ID: [name] MRN: [id number] DOB/AGE: [birth year] 49 y.o.  Admit date: 10/02/2019 Discharge date: 10/04/2019  Admission Diagnoses:  Active Problems:   Status post cervical spinal fusion   Pseudoarthrosis of lumbar spine   Discharge Diagnoses:  Same  Past Medical History:  Diagnosis Date   Anxiety    Arthritis    back area   Blood dyscrasia    thrombocytopenia  15 months   Chronic back pain    Complication of anesthesia    noticed some memory loss after surgery 07/18/12   Depression    on prozac   Dysrhythmia    PAF in 2012, felt related to OSA--now on CPAP; PRN follow-up Dr. Lin Givens 03/2011   GERD (gastroesophageal reflux disease)    uses baking soda   Hyperlipidemia    Hypertension    Dr. 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 male who was admitted 10/02/2019 for operative treatment of nonunion. Patient has severe unremitting pain that affects sleep, daily activities, and work/hobbies. After pre-op clearance the patient was taken to the operating room on 10/02/2019 and underwent  Procedure(s): LUMBAR 4-SACRUM 1 POSTERIOR SPINAL FUSION WITH INSTRUMENTATION AND ALLOGRAFT WITH BONE MORPHOGENIC PROTEIN.    Patient was given perioperative antibiotics:  Anti-infectives (From admission, onward)   Start     Dose/Rate Route Frequency Ordered Stop   10/02/19 1930  ceFAZolin (ANCEF) IVPB 2g/100 mL premix     2 g 200 mL/hr over 30 Minutes Intravenous Every 8 hours 10/02/19 1749 10/03/19 0252   10/02/19 0600   ceFAZolin (ANCEF) 3 g in dextrose 5 % 50 mL IVPB     3 g 100 mL/hr over 30 Minutes Intravenous On call to O.R. 10/01/19 0755 10/02/19 1616       Patient was given sequential compression devices, early ambulation to prevent DVT.  Patient benefited maximally from hospital stay and there were no complications.    Recent vital signs: BP 114/62 (BP Location: Left Arm)    Pulse (!) 117    Temp 99.2 F (37.3 C) (Oral)    Resp 20    Ht 6\' 1"  (1.854 m)    Wt 130.2 kg    SpO2 94%    BMI 37.87 kg/m    Discharge Medications:   Allergies as of 10/04/2019      Reactions   Nexium [esomeprazole Magnesium] Hives, Swelling   Wellbutrin [bupropion] Hives, Swelling      Medication List    STOP taking these medications   acetaminophen 500 MG tablet Commonly known as: TYLENOL     TAKE these medications   amLODipine 10 MG tablet Commonly known as: NORVASC Take 10 mg by mouth daily.   cyanocobalamin 1000 MCG/ML injection Commonly known as: (VITAMIN B-12) Inject 1,000 mcg into the muscle every 14 (fourteen) days.   Depo-Testosterone 200 MG/ML injection Generic drug: testosterone cypionate Inject 200 mg into the muscle every 14 (fourteen) days.   famotidine 20 MG tablet Commonly known as: Pepcid Take 1 tablet (  20 mg total) by mouth 2 (two) times daily. What changed:   when to take this  reasons to take this   fenofibrate 160 MG tablet Take 160 mg by mouth daily.   FLUoxetine 10 MG capsule Commonly known as: PROZAC Take 10 mg by mouth daily.   lisinopril 20 MG tablet Commonly known as: ZESTRIL Take 1 tablet (20 mg total) by mouth at bedtime. Take an extra 1/2 = 10 mg in the evening What changed: additional instructions   methocarbamol 750 MG tablet Commonly known as: ROBAXIN Take 375 mg by mouth every 4 (four) hours as needed for muscle spasms.   montelukast 10 MG tablet Commonly known as: SINGULAIR Take 10 mg by mouth at bedtime.   omeprazole 20 MG capsule Commonly known  as: PRILOSEC Take 20 mg by mouth daily.   Oxycodone HCl 10 MG Tabs Take 10 mg by mouth every 4 (four) hours as needed (pain).   Vascepa 1 g capsule Generic drug: icosapent Ethyl Take 2 capsules by mouth 2 (two) times daily.       Diagnostic Studies: DG Lumbar Spine 2-3 Views  Result Date: 10/02/2019 CLINICAL DATA:  L4-S1 posterior spinal fusion. EXAM: LUMBAR SPINE - 2-3 VIEW; DG C-ARM 1-60 MIN COMPARISON:  Intraoperative radiographs earlier same date. CT 09/09/2019. FINDINGS: C-arm fluoroscopy was provided in the operating room. 1 minutes and 15 seconds of fluoroscopy time. PA and lateral spot fluoroscopic images of the lower lumbar spine are submitted. There are postsurgical changes related to previous interbody fusion at L4-5, anterior interbody fusion at L5-S1 and placement of an L4-5 spinous distractor. There are new bilateral pedicle screws and interconnecting rods at L4, L5 and S1 which appear well positioned. No demonstrated complications. IMPRESSION: Intraoperative views during L4-S1 posterior fusion. No demonstrated complication. Electronically Signed   By: Richardean Sale M.D.   On: 10/02/2019 17:02   DG Lumbar Spine 1 View  Result Date: 10/02/2019 CLINICAL DATA:  Lumbosacral fusion. EXAM: LUMBAR SPINE - 1 VIEW COMPARISON:  CT L-spine 09/09/2019 FINDINGS: Cross-table portable view lumbar spine 11:32 a.m. Spinal numbering assumes last full open disc space at L5-S1. Interbody graft material noted L4-5 and patient is fused across L5-S1 with fusion hardware noted in the sacrum and fixation hardware at the posterior elements of L4-5. Battery pack for presumed spinal stimulator noted. Needles are seen overlying the posterior soft tissues of the lower back. The more cranial of the 2 needles is positioned with the tip just posterior to the L4 spinous process. The more caudal of the 2 needles is positioned with the tip overlying the soft tissues of the low back at about the low S1 the S1-2 level.  IMPRESSION: Intraoperative localization. Electronically Signed   By: Misty Stanley M.D.   On: 10/02/2019 16:58   DG C-Arm 1-60 Min  Result Date: 10/02/2019 CLINICAL DATA:  L4-S1 posterior spinal fusion. EXAM: LUMBAR SPINE - 2-3 VIEW; DG C-ARM 1-60 MIN COMPARISON:  Intraoperative radiographs earlier same date. CT 09/09/2019. FINDINGS: C-arm fluoroscopy was provided in the operating room. 1 minutes and 15 seconds of fluoroscopy time. PA and lateral spot fluoroscopic images of the lower lumbar spine are submitted. There are postsurgical changes related to previous interbody fusion at L4-5, anterior interbody fusion at L5-S1 and placement of an L4-5 spinous distractor. There are new bilateral pedicle screws and interconnecting rods at L4, L5 and S1 which appear well positioned. No demonstrated complications. IMPRESSION: Intraoperative views during L4-S1 posterior fusion. No demonstrated complication. Electronically Signed  By: Richardean Sale M.D.   On: 10/02/2019 17:02    Disposition: Discharge disposition: 01-Home or Self Care        POD #2 s/p L4-S1 PSF, doing well, eager to progress home   - up with PT/OT, encourage ambulation - Pain meds will be controlled by [facility] Mngmt upon D/C - Home Health to be ordered and established  -D/C instructions sheet printed and in chart -D/C today  -F/U in office 2 weeks   Signed: Lennie Muckle Sylvi Rybolt 10/09/2019, 11:29 AM

## 2019-10-10 ENCOUNTER — Other Ambulatory Visit: Payer: Self-pay | Admitting: *Deleted

## 2019-10-10 NOTE — Patient Outreach (Signed)
[facility]) Care Management  10/10/2019  VASSILIOS JERABEK [birth year] [id number]   Transition of care follow up call  Case Closure    Referral received:09/30/19 Initial outreach:10/02/19 Insurance: [place]   Subjective: Successful follow up call to patient to follow up on scheduled post discharge visit with surgeon, patient reports appointment scheduled for 10/16/19. He reports that continues to manage okay at home, tolerating exercises and therapy, some days better than others.   Objective: Mr. Fluharty was hospitalized [facility] 2/24-2/26/21 for Posterior spinal fusion Comorbidities include: Low back pain, hypertension, depression . He was discharged to home on 2/26/21with health services for physical and occupational therapy with [facility] services and received, rolling walker and 3 in 1 for DME.  Assessment:  Patient voices good understanding of all discharge instructions. Will call surgeon office to schedule post discharge visit.  See transition of care flowsheet for assessment details   Plan No further care management needs identified , will close case to [facility] care management.    Joylene Draft, RN, BSN  [place] Management Coordinator  [phone]- Mobile [phone]- Toll Free Main Office

## 2019-10-14 DIAGNOSIS — M48061 Spinal stenosis, lumbar region without neurogenic claudication: Secondary | ICD-10-CM | POA: Diagnosis not present

## 2019-10-14 DIAGNOSIS — F17210 Nicotine dependence, cigarettes, uncomplicated: Secondary | ICD-10-CM | POA: Diagnosis not present

## 2019-10-14 DIAGNOSIS — E785 Hyperlipidemia, unspecified: Secondary | ICD-10-CM | POA: Diagnosis not present

## 2019-10-14 DIAGNOSIS — I10 Essential (primary) hypertension: Secondary | ICD-10-CM | POA: Diagnosis not present

## 2019-10-14 DIAGNOSIS — M960 Pseudarthrosis after fusion or arthrodesis: Secondary | ICD-10-CM | POA: Diagnosis not present

## 2019-10-14 DIAGNOSIS — G4730 Sleep apnea, unspecified: Secondary | ICD-10-CM | POA: Diagnosis not present

## 2019-10-14 DIAGNOSIS — F419 Anxiety disorder, unspecified: Secondary | ICD-10-CM | POA: Diagnosis not present

## 2019-10-14 DIAGNOSIS — F329 Major depressive disorder, single episode, unspecified: Secondary | ICD-10-CM | POA: Diagnosis not present

## 2019-10-14 DIAGNOSIS — Z4789 Encounter for other orthopedic aftercare: Secondary | ICD-10-CM | POA: Diagnosis not present

## 2019-10-15 DIAGNOSIS — M960 Pseudarthrosis after fusion or arthrodesis: Secondary | ICD-10-CM | POA: Diagnosis not present

## 2019-10-15 DIAGNOSIS — F329 Major depressive disorder, single episode, unspecified: Secondary | ICD-10-CM | POA: Diagnosis not present

## 2019-10-15 DIAGNOSIS — I10 Essential (primary) hypertension: Secondary | ICD-10-CM | POA: Diagnosis not present

## 2019-10-15 DIAGNOSIS — F17210 Nicotine dependence, cigarettes, uncomplicated: Secondary | ICD-10-CM | POA: Diagnosis not present

## 2019-10-15 DIAGNOSIS — G4730 Sleep apnea, unspecified: Secondary | ICD-10-CM | POA: Diagnosis not present

## 2019-10-15 DIAGNOSIS — E785 Hyperlipidemia, unspecified: Secondary | ICD-10-CM | POA: Diagnosis not present

## 2019-10-15 DIAGNOSIS — Z4789 Encounter for other orthopedic aftercare: Secondary | ICD-10-CM | POA: Diagnosis not present

## 2019-10-15 DIAGNOSIS — M48061 Spinal stenosis, lumbar region without neurogenic claudication: Secondary | ICD-10-CM | POA: Diagnosis not present

## 2019-10-15 DIAGNOSIS — F419 Anxiety disorder, unspecified: Secondary | ICD-10-CM | POA: Diagnosis not present

## 2019-10-18 DIAGNOSIS — G4730 Sleep apnea, unspecified: Secondary | ICD-10-CM | POA: Diagnosis not present

## 2019-10-18 DIAGNOSIS — F419 Anxiety disorder, unspecified: Secondary | ICD-10-CM | POA: Diagnosis not present

## 2019-10-18 DIAGNOSIS — I10 Essential (primary) hypertension: Secondary | ICD-10-CM | POA: Diagnosis not present

## 2019-10-18 DIAGNOSIS — E785 Hyperlipidemia, unspecified: Secondary | ICD-10-CM | POA: Diagnosis not present

## 2019-10-18 DIAGNOSIS — M48061 Spinal stenosis, lumbar region without neurogenic claudication: Secondary | ICD-10-CM | POA: Diagnosis not present

## 2019-10-18 DIAGNOSIS — F17210 Nicotine dependence, cigarettes, uncomplicated: Secondary | ICD-10-CM | POA: Diagnosis not present

## 2019-10-18 DIAGNOSIS — M960 Pseudarthrosis after fusion or arthrodesis: Secondary | ICD-10-CM | POA: Diagnosis not present

## 2019-10-18 DIAGNOSIS — F329 Major depressive disorder, single episode, unspecified: Secondary | ICD-10-CM | POA: Diagnosis not present

## 2019-10-18 DIAGNOSIS — Z4789 Encounter for other orthopedic aftercare: Secondary | ICD-10-CM | POA: Diagnosis not present

## 2019-10-21 DIAGNOSIS — F329 Major depressive disorder, single episode, unspecified: Secondary | ICD-10-CM | POA: Diagnosis not present

## 2019-10-21 DIAGNOSIS — E785 Hyperlipidemia, unspecified: Secondary | ICD-10-CM | POA: Diagnosis not present

## 2019-10-21 DIAGNOSIS — G4730 Sleep apnea, unspecified: Secondary | ICD-10-CM | POA: Diagnosis not present

## 2019-10-21 DIAGNOSIS — Z4789 Encounter for other orthopedic aftercare: Secondary | ICD-10-CM | POA: Diagnosis not present

## 2019-10-21 DIAGNOSIS — M960 Pseudarthrosis after fusion or arthrodesis: Secondary | ICD-10-CM | POA: Diagnosis not present

## 2019-10-21 DIAGNOSIS — F17210 Nicotine dependence, cigarettes, uncomplicated: Secondary | ICD-10-CM | POA: Diagnosis not present

## 2019-10-21 DIAGNOSIS — I10 Essential (primary) hypertension: Secondary | ICD-10-CM | POA: Diagnosis not present

## 2019-10-21 DIAGNOSIS — M48061 Spinal stenosis, lumbar region without neurogenic claudication: Secondary | ICD-10-CM | POA: Diagnosis not present

## 2019-10-21 DIAGNOSIS — F419 Anxiety disorder, unspecified: Secondary | ICD-10-CM | POA: Diagnosis not present

## 2019-10-22 DIAGNOSIS — Z9889 Other specified postprocedural states: Secondary | ICD-10-CM | POA: Diagnosis not present

## 2019-10-28 DIAGNOSIS — G894 Chronic pain syndrome: Secondary | ICD-10-CM | POA: Diagnosis not present

## 2019-10-28 DIAGNOSIS — K5903 Drug induced constipation: Secondary | ICD-10-CM | POA: Diagnosis not present

## 2019-10-28 DIAGNOSIS — M961 Postlaminectomy syndrome, not elsewhere classified: Secondary | ICD-10-CM | POA: Diagnosis not present

## 2019-10-28 DIAGNOSIS — Z79891 Long term (current) use of opiate analgesic: Secondary | ICD-10-CM | POA: Diagnosis not present

## 2019-10-28 MED FILL — METHOCARBAMOL 750 MG TABS: 750 | 30 days supply | Qty: 120 | Fill #0

## 2019-10-28 MED FILL — FLUoxetine HCL 10 MG CAPS: 10 | 30 days supply | Qty: 30 | Fill #0

## 2019-10-30 MED FILL — oxyCODONE HCL 10 MG TABS: 10 | 30 days supply | Qty: 180 | Fill #0

## 2019-10-31 DIAGNOSIS — M545 Low back pain: Secondary | ICD-10-CM | POA: Diagnosis not present

## 2019-10-31 DIAGNOSIS — Z981 Arthrodesis status: Secondary | ICD-10-CM | POA: Diagnosis not present

## 2019-10-31 MED FILL — MONTELUKAST SOD 10 MG TAB: 10 | 90 days supply | Qty: 90 | Fill #0

## 2019-11-18 DIAGNOSIS — M545 Low back pain: Secondary | ICD-10-CM | POA: Diagnosis not present

## 2019-11-28 MED FILL — FLUoxetine HCL 10 MG CAPS: 10 | 30 days supply | Qty: 30 | Fill #1

## 2019-11-28 MED FILL — METHOCARBAMOL 750 MG TABS: 750 | 30 days supply | Qty: 120 | Fill #1

## 2019-11-29 MED FILL — FENOFIBRATE 160 MG TABLET: 160 | 90 days supply | Qty: 90 | Fill #0

## 2019-11-29 MED FILL — oxyCODONE HCL 10 MG TABS: 10 | 30 days supply | Qty: 180 | Fill #0

## 2019-11-29 MED FILL — VASCEPA 1 GM CAPSULE: 1 | 30 days supply | Qty: 120 | Fill #0

## 2019-12-10 DIAGNOSIS — Z9889 Other specified postprocedural states: Secondary | ICD-10-CM | POA: Diagnosis not present

## 2019-12-23 DIAGNOSIS — G894 Chronic pain syndrome: Secondary | ICD-10-CM | POA: Diagnosis not present

## 2019-12-23 DIAGNOSIS — Z79891 Long term (current) use of opiate analgesic: Secondary | ICD-10-CM | POA: Diagnosis not present

## 2019-12-23 DIAGNOSIS — K5903 Drug induced constipation: Secondary | ICD-10-CM | POA: Diagnosis not present

## 2019-12-23 DIAGNOSIS — M961 Postlaminectomy syndrome, not elsewhere classified: Secondary | ICD-10-CM | POA: Diagnosis not present

## 2019-12-23 MED FILL — HYDROCODON-APAP 10-325: 10-325 | 30 days supply | Qty: 180 | Fill #0

## 2020-01-03 DIAGNOSIS — M545 Low back pain: Secondary | ICD-10-CM | POA: Diagnosis not present

## 2020-01-08 MED FILL — METHOCARBAMOL 750 MG TABS: 750 | 30 days supply | Qty: 120 | Fill #1

## 2020-01-08 MED FILL — FLUoxetine HCL 10 MG CAPS: 10 | 30 days supply | Qty: 30 | Fill #2

## 2020-01-17 DIAGNOSIS — G894 Chronic pain syndrome: Secondary | ICD-10-CM | POA: Diagnosis not present

## 2020-01-17 DIAGNOSIS — M961 Postlaminectomy syndrome, not elsewhere classified: Secondary | ICD-10-CM | POA: Diagnosis not present

## 2020-01-17 DIAGNOSIS — Z79891 Long term (current) use of opiate analgesic: Secondary | ICD-10-CM | POA: Diagnosis not present

## 2020-01-17 DIAGNOSIS — K5903 Drug induced constipation: Secondary | ICD-10-CM | POA: Diagnosis not present

## 2020-02-05 DIAGNOSIS — M545 Low back pain: Secondary | ICD-10-CM | POA: Diagnosis not present

## 2020-02-06 MED FILL — LISINOPRIL 20 MG TABLET: 20 | 90 days supply | Qty: 90 | Fill #0

## 2020-02-06 MED FILL — MONTELUKAST SOD 10 MG TAB: 10 | 90 days supply | Qty: 90 | Fill #0

## 2020-02-06 MED FILL — VASCEPA 1 GM CAPSULE: 1 | 30 days supply | Qty: 120 | Fill #1

## 2020-02-06 MED FILL — FLUoxetine HCL 10 MG CAPS: 10 | 30 days supply | Qty: 30 | Fill #0

## 2020-02-06 MED FILL — METHOCARBAMOL 750 MG TABS: 750 | 30 days supply | Qty: 120 | Fill #0

## 2020-02-06 MED FILL — AMLODIPINE BESYLATE 10 MG T: 10 | 90 days supply | Qty: 90 | Fill #0

## 2020-02-11 DIAGNOSIS — M545 Low back pain: Secondary | ICD-10-CM | POA: Diagnosis not present

## 2020-02-14 ENCOUNTER — Other Ambulatory Visit (HOSPITAL_COMMUNITY): Payer: Self-pay | Admitting: Anesthesiology

## 2020-02-14 DIAGNOSIS — M961 Postlaminectomy syndrome, not elsewhere classified: Secondary | ICD-10-CM | POA: Diagnosis not present

## 2020-02-14 DIAGNOSIS — Z79891 Long term (current) use of opiate analgesic: Secondary | ICD-10-CM | POA: Diagnosis not present

## 2020-02-14 DIAGNOSIS — G894 Chronic pain syndrome: Secondary | ICD-10-CM | POA: Diagnosis not present

## 2020-02-14 DIAGNOSIS — K5903 Drug induced constipation: Secondary | ICD-10-CM | POA: Diagnosis not present

## 2020-02-14 MED FILL — oxyCODONE HCL 10 MG TABS: 10 | 30 days supply | Qty: 180 | Fill #0

## 2020-02-17 DIAGNOSIS — M545 Low back pain: Secondary | ICD-10-CM | POA: Diagnosis not present

## 2020-02-20 DIAGNOSIS — M545 Low back pain: Secondary | ICD-10-CM | POA: Diagnosis not present

## 2020-03-13 DIAGNOSIS — M5416 Radiculopathy, lumbar region: Secondary | ICD-10-CM | POA: Diagnosis not present

## 2020-03-13 DIAGNOSIS — M545 Low back pain: Secondary | ICD-10-CM | POA: Diagnosis not present

## 2020-03-13 MED FILL — FENOFIBRATE 160 MG TABLET: 160 | 90 days supply | Qty: 90 | Fill #0

## 2020-03-13 MED FILL — METHOCARBAMOL 750 MG TABS: 750 | 30 days supply | Qty: 120 | Fill #1

## 2020-03-13 MED FILL — FLUoxetine HCL 10 MG CAPS: 10 | 30 days supply | Qty: 30 | Fill #1

## 2020-03-14 MED FILL — oxyCODONE HCL 10 MG TABS: 10 | 30 days supply | Qty: 180 | Fill #0

## 2020-03-26 ENCOUNTER — Other Ambulatory Visit: Payer: Self-pay | Admitting: Orthopedic Surgery

## 2020-03-26 DIAGNOSIS — M5416 Radiculopathy, lumbar region: Secondary | ICD-10-CM

## 2020-03-27 ENCOUNTER — Telehealth: Payer: Self-pay

## 2020-03-27 NOTE — Telephone Encounter (Signed)
Phone call to patient to verify medication list and allergies for myelogram procedure. Pt instructed to hold Prozac for 48hrs prior to myelogram appointment time and 24 hours after appointment. Pt also instructed to have a driver the day of the procedure, the procedure would take around 2 hours, and discharge instructions discussed. Pt verbalized understanding.   

## 2020-04-02 ENCOUNTER — Ambulatory Visit
Admission: RE | Admit: 2020-04-02 | Discharge: 2020-04-02 | Disposition: A | Discharge status: Home / Self Care | Payer: 59 | Source: Ambulatory Visit | Attending: Orthopedic Surgery | Admitting: Orthopedic Surgery

## 2020-04-02 ENCOUNTER — Other Ambulatory Visit: Payer: Self-pay

## 2020-04-02 DIAGNOSIS — M5416 Radiculopathy, lumbar region: Secondary | ICD-10-CM

## 2020-04-02 MED ORDER — IOPAMIDOL (ISOVUE-M 200) INJECTION 41%
20.0000 mL | Freq: Once | INTRAMUSCULAR | Status: AC
  Administered 2020-04-02: 20 mL via INTRATHECAL

## 2020-04-02 MED ORDER — DIAZEPAM 5 MG PO TABS
10.0000 mg | ORAL_TABLET | Freq: Once | ORAL | Status: AC
  Administered 2020-04-02: 10 mg via ORAL

## 2020-04-02 MED ORDER — MEPERIDINE HCL 100 MG/ML IJ SOLN
75.0000 mg | Freq: Once | INTRAMUSCULAR | Status: AC
  Administered 2020-04-02: 75 mg via INTRAMUSCULAR

## 2020-04-02 MED ORDER — ONDANSETRON HCL 4 MG/2ML IJ SOLN
4.0000 mg | Freq: Once | INTRAMUSCULAR | Status: AC
  Administered 2020-04-02: 4 mg via INTRAMUSCULAR

## 2020-04-02 NOTE — Progress Notes (Signed)
Patient states he has been off Prozac for at least the past two days. 

## 2020-04-02 NOTE — Discharge Instructions (Signed)
Myelogram Discharge Instructions  1. Go home and rest quietly for the next 24 hours.  It is important to lie flat for the next 24 hours.  Get up only to go to the restroom.  You may lie in the bed or on a couch on your back, your stomach, your left side or your right side.  You may have one pillow under your head.  You may have pillows between your knees while you are on your side or under your knees while you are on your back.  2. DO NOT drive today.  Recline the seat as far back as it will go, while still wearing your seat belt, on the way home.  3. You may get up to go to the bathroom as needed.  You may sit up for 10 minutes to eat.  You may resume your normal diet and medications unless otherwise indicated.  Drink lots of extra fluids today and tomorrow.  4. The incidence of headache, nausea, or vomiting is about 5% (one in 20 patients).  If you develop a headache, lie flat and drink plenty of fluids until the headache goes away.  Caffeinated beverages may be helpful.  If you develop severe nausea and vomiting or a headache that does not go away with flat bed rest, call [phone].  5. You may resume normal activities after your 24 hours of bed rest is over; however, do not exert yourself strongly or do any heavy lifting tomorrow. If when you get up you have a headache when standing, go back to bed and force fluids for another 24 hours.  6. Call your physician for a follow-up appointment.  The results of your myelogram will be sent directly to your physician by the following day.  7. If you have any questions or if complications develop after you arrive home, please call [phone].  Discharge instructions have been explained to the patient.  The patient, or the person responsible for the patient, fully understands these instructions.  YOU MAY RESTART YOUR PROZAC TOMORROW 04/03/2020 AT 09:30AM.

## 2020-04-08 ENCOUNTER — Other Ambulatory Visit: Payer: 59

## 2020-04-16 MED FILL — OXYCODONE-APAP 10-325: 10-325 | 30 days supply | Qty: 180 | Fill #0

## 2020-04-17 MED FILL — METHOCARBAMOL 750 MG TABS: 750 | 30 days supply | Qty: 120 | Fill #0

## 2020-05-14 ENCOUNTER — Other Ambulatory Visit (HOSPITAL_COMMUNITY): Payer: Self-pay | Admitting: Anesthesiology

## 2020-05-14 MED FILL — OXYCODONE-APAP 10-325: 10-325 | 30 days supply | Qty: 180 | Fill #0

## 2020-05-18 ENCOUNTER — Other Ambulatory Visit (HOSPITAL_COMMUNITY): Payer: Self-pay | Admitting: Family Medicine

## 2020-05-18 MED FILL — AMLODIPINE BESYLATE 10 MG T: 10 | 30 days supply | Qty: 30 | Fill #0

## 2020-05-25 MED FILL — LISINOPRIL 20 MG TABS: 20 | 30 days supply | Qty: 30 | Fill #1

## 2020-05-27 MED FILL — METHOCARBAMOL 750 MG TABS: 750 | 30 days supply | Qty: 120 | Fill #0

## 2020-05-27 MED FILL — AMLODIPINE BESYLATE 10 MG T: 10 | 30 days supply | Qty: 30 | Fill #0

## 2020-05-27 MED FILL — FLUoxetine HCL 10 MG CAPS: 10 | 30 days supply | Qty: 30 | Fill #2

## 2020-05-27 MED FILL — VASCEPA 1 GM CAPSULE: 1 | 30 days supply | Qty: 120 | Fill #2

## 2020-05-27 MED FILL — MOVANTIK 25 MG TABS: 25 | 30 days supply | Qty: 30 | Fill #0

## 2020-05-27 MED FILL — MONTELUKAST SOD 10 MG TAB: 10 | 90 days supply | Qty: 90 | Fill #1

## 2020-06-03 ENCOUNTER — Other Ambulatory Visit (HOSPITAL_COMMUNITY): Payer: Self-pay | Admitting: Family Medicine

## 2020-06-03 DIAGNOSIS — F331 Major depressive disorder, recurrent, moderate: Secondary | ICD-10-CM | POA: Diagnosis not present

## 2020-06-03 DIAGNOSIS — I480 Paroxysmal atrial fibrillation: Secondary | ICD-10-CM | POA: Diagnosis not present

## 2020-06-03 DIAGNOSIS — G8929 Other chronic pain: Secondary | ICD-10-CM | POA: Diagnosis not present

## 2020-06-03 DIAGNOSIS — E781 Pure hyperglyceridemia: Secondary | ICD-10-CM | POA: Diagnosis not present

## 2020-06-03 DIAGNOSIS — K5903 Drug induced constipation: Secondary | ICD-10-CM | POA: Diagnosis not present

## 2020-06-03 DIAGNOSIS — M549 Dorsalgia, unspecified: Secondary | ICD-10-CM | POA: Diagnosis not present

## 2020-06-03 DIAGNOSIS — Z79899 Other long term (current) drug therapy: Secondary | ICD-10-CM | POA: Diagnosis not present

## 2020-06-03 DIAGNOSIS — I10 Essential (primary) hypertension: Secondary | ICD-10-CM | POA: Diagnosis not present

## 2020-06-03 DIAGNOSIS — E291 Testicular hypofunction: Secondary | ICD-10-CM | POA: Diagnosis not present

## 2020-06-03 DIAGNOSIS — T402X5A Adverse effect of other opioids, initial encounter: Secondary | ICD-10-CM | POA: Diagnosis not present

## 2020-06-03 DIAGNOSIS — E538 Deficiency of other specified B group vitamins: Secondary | ICD-10-CM | POA: Diagnosis not present

## 2020-06-03 MED FILL — TESTOSTERONE CYP 200 MG/ML: 200 | 84 days supply | Qty: 3 | Fill #0

## 2020-06-03 MED FILL — SM CLEARLAX POWDER: 17 | 28 days supply | Qty: 952 | Fill #0

## 2020-06-13 MED FILL — OXYCODONE-APAP 10-325: 10-325 | 30 days supply | Qty: 180 | Fill #0

## 2020-06-22 DIAGNOSIS — G4733 Obstructive sleep apnea (adult) (pediatric): Secondary | ICD-10-CM | POA: Diagnosis not present

## 2020-06-22 DIAGNOSIS — Z6841 Body Mass Index (BMI) 40.0 and over, adult: Secondary | ICD-10-CM | POA: Diagnosis not present

## 2020-07-01 ENCOUNTER — Other Ambulatory Visit (HOSPITAL_COMMUNITY): Payer: Self-pay | Admitting: Family Medicine

## 2020-07-01 MED FILL — VASCEPA 1 GM CAPSULE: 1 | 30 days supply | Qty: 120 | Fill #3

## 2020-07-01 MED FILL — LISINOPRIL 20 MG TABS: 20 | 90 days supply | Qty: 90 | Fill #0

## 2020-07-01 MED FILL — AMLODIPINE-BENAZEPRIL 10-20: 10-20 | 90 days supply | Qty: 90 | Fill #0

## 2020-07-01 MED FILL — FENOFIBRATE 160 MG TABLET: 160 | 90 days supply | Qty: 90 | Fill #0

## 2020-07-01 MED FILL — FLUoxetine HCL 10 MG CAPS: 10 | 30 days supply | Qty: 30 | Fill #0

## 2020-07-07 MED FILL — METHOCARBAMOL 750 MG TABS: 750 | 30 days supply | Qty: 120 | Fill #1

## 2020-07-09 ENCOUNTER — Other Ambulatory Visit (HOSPITAL_COMMUNITY): Payer: Self-pay | Admitting: Anesthesiology

## 2020-07-09 MED FILL — MOVANTIK 25 MG TABS: 25 | 30 days supply | Qty: 30 | Fill #0

## 2020-07-13 DIAGNOSIS — R0600 Dyspnea, unspecified: Secondary | ICD-10-CM | POA: Diagnosis not present

## 2020-07-13 DIAGNOSIS — G4733 Obstructive sleep apnea (adult) (pediatric): Secondary | ICD-10-CM | POA: Diagnosis not present

## 2020-07-13 DIAGNOSIS — J301 Allergic rhinitis due to pollen: Secondary | ICD-10-CM | POA: Diagnosis not present

## 2020-07-13 DIAGNOSIS — R5383 Other fatigue: Secondary | ICD-10-CM | POA: Diagnosis not present

## 2020-07-13 MED FILL — OXYCODONE-APAP 10-325: 10-325 | 30 days supply | Qty: 180 | Fill #0

## 2020-07-29 ENCOUNTER — Ambulatory Visit
Admission: RE | Admit: 2020-07-29 | Discharge: 2020-07-29 | Disposition: A | Discharge status: Home / Self Care | Payer: 59 | Source: Ambulatory Visit | Attending: Anesthesiology | Admitting: Anesthesiology

## 2020-07-29 ENCOUNTER — Other Ambulatory Visit: Payer: Self-pay | Admitting: Anesthesiology

## 2020-07-29 DIAGNOSIS — M533 Sacrococcygeal disorders, not elsewhere classified: Secondary | ICD-10-CM | POA: Diagnosis not present

## 2020-07-29 DIAGNOSIS — M1611 Unilateral primary osteoarthritis, right hip: Secondary | ICD-10-CM | POA: Diagnosis not present

## 2020-07-29 DIAGNOSIS — K5903 Drug induced constipation: Secondary | ICD-10-CM | POA: Diagnosis not present

## 2020-07-29 DIAGNOSIS — M25551 Pain in right hip: Secondary | ICD-10-CM | POA: Diagnosis not present

## 2020-07-29 DIAGNOSIS — M961 Postlaminectomy syndrome, not elsewhere classified: Secondary | ICD-10-CM | POA: Diagnosis not present

## 2020-07-29 DIAGNOSIS — G894 Chronic pain syndrome: Secondary | ICD-10-CM | POA: Diagnosis not present

## 2020-07-29 DIAGNOSIS — G8929 Other chronic pain: Secondary | ICD-10-CM

## 2020-07-29 DIAGNOSIS — Z981 Arthrodesis status: Secondary | ICD-10-CM | POA: Diagnosis not present

## 2020-08-11 DIAGNOSIS — G4733 Obstructive sleep apnea (adult) (pediatric): Secondary | ICD-10-CM | POA: Diagnosis not present

## 2020-08-13 ENCOUNTER — Other Ambulatory Visit: Payer: Self-pay | Admitting: Anesthesiology

## 2020-08-13 ENCOUNTER — Other Ambulatory Visit (HOSPITAL_COMMUNITY): Payer: Self-pay | Admitting: Anesthesiology

## 2020-08-13 DIAGNOSIS — M25551 Pain in right hip: Secondary | ICD-10-CM

## 2020-08-13 DIAGNOSIS — M62838 Other muscle spasm: Secondary | ICD-10-CM

## 2020-08-13 DIAGNOSIS — M545 Low back pain, unspecified: Secondary | ICD-10-CM

## 2020-08-19 ENCOUNTER — Encounter (HOSPITAL_COMMUNITY)
Admission: RE | Admit: 2020-08-19 | Discharge: 2020-08-19 | Disposition: A | Discharge status: Home / Self Care | Payer: PPO | Source: Ambulatory Visit | Attending: Anesthesiology | Admitting: Anesthesiology

## 2020-08-19 ENCOUNTER — Other Ambulatory Visit: Payer: Self-pay

## 2020-08-19 ENCOUNTER — Other Ambulatory Visit (HOSPITAL_COMMUNITY): Payer: Self-pay | Admitting: Anesthesiology

## 2020-08-19 DIAGNOSIS — M25551 Pain in right hip: Secondary | ICD-10-CM | POA: Insufficient documentation

## 2020-08-19 DIAGNOSIS — M62838 Other muscle spasm: Secondary | ICD-10-CM

## 2020-08-19 DIAGNOSIS — M545 Low back pain, unspecified: Secondary | ICD-10-CM | POA: Diagnosis not present

## 2020-08-19 DIAGNOSIS — M47816 Spondylosis without myelopathy or radiculopathy, lumbar region: Secondary | ICD-10-CM | POA: Diagnosis not present

## 2020-08-19 DIAGNOSIS — Z981 Arthrodesis status: Secondary | ICD-10-CM | POA: Diagnosis not present

## 2020-08-19 DIAGNOSIS — M19071 Primary osteoarthritis, right ankle and foot: Secondary | ICD-10-CM | POA: Diagnosis not present

## 2020-08-19 DIAGNOSIS — R948 Abnormal results of function studies of other organs and systems: Secondary | ICD-10-CM | POA: Diagnosis not present

## 2020-08-19 MED ORDER — TECHNETIUM TC 99M MEDRONATE IV KIT
21.7000 | PACK | Freq: Once | INTRAVENOUS | Status: AC
  Administered 2020-08-19: 21.7 via INTRAVENOUS

## 2020-09-01 DIAGNOSIS — R109 Unspecified abdominal pain: Secondary | ICD-10-CM | POA: Diagnosis not present

## 2020-09-02 DIAGNOSIS — G894 Chronic pain syndrome: Secondary | ICD-10-CM | POA: Diagnosis not present

## 2020-09-02 DIAGNOSIS — K5903 Drug induced constipation: Secondary | ICD-10-CM | POA: Diagnosis not present

## 2020-09-02 DIAGNOSIS — M961 Postlaminectomy syndrome, not elsewhere classified: Secondary | ICD-10-CM | POA: Diagnosis not present

## 2020-09-02 DIAGNOSIS — M25551 Pain in right hip: Secondary | ICD-10-CM | POA: Diagnosis not present

## 2020-09-10 DIAGNOSIS — M6281 Muscle weakness (generalized): Secondary | ICD-10-CM | POA: Diagnosis not present

## 2020-09-18 DIAGNOSIS — M6281 Muscle weakness (generalized): Secondary | ICD-10-CM | POA: Diagnosis not present

## 2020-09-30 DIAGNOSIS — G894 Chronic pain syndrome: Secondary | ICD-10-CM | POA: Diagnosis not present

## 2020-09-30 DIAGNOSIS — K5903 Drug induced constipation: Secondary | ICD-10-CM | POA: Diagnosis not present

## 2020-09-30 DIAGNOSIS — M961 Postlaminectomy syndrome, not elsewhere classified: Secondary | ICD-10-CM | POA: Diagnosis not present

## 2020-09-30 DIAGNOSIS — M25551 Pain in right hip: Secondary | ICD-10-CM | POA: Diagnosis not present

## 2020-10-07 DIAGNOSIS — M5431 Sciatica, right side: Secondary | ICD-10-CM | POA: Diagnosis not present

## 2020-10-07 DIAGNOSIS — G4733 Obstructive sleep apnea (adult) (pediatric): Secondary | ICD-10-CM | POA: Diagnosis not present

## 2020-10-07 DIAGNOSIS — R262 Difficulty in walking, not elsewhere classified: Secondary | ICD-10-CM | POA: Diagnosis not present

## 2020-10-07 DIAGNOSIS — M545 Low back pain, unspecified: Secondary | ICD-10-CM | POA: Diagnosis not present

## 2020-10-07 DIAGNOSIS — R5383 Other fatigue: Secondary | ICD-10-CM | POA: Diagnosis not present

## 2020-10-07 DIAGNOSIS — J301 Allergic rhinitis due to pollen: Secondary | ICD-10-CM | POA: Diagnosis not present

## 2020-10-07 DIAGNOSIS — R0600 Dyspnea, unspecified: Secondary | ICD-10-CM | POA: Diagnosis not present

## 2020-10-21 DIAGNOSIS — R262 Difficulty in walking, not elsewhere classified: Secondary | ICD-10-CM | POA: Diagnosis not present

## 2020-10-21 DIAGNOSIS — G4733 Obstructive sleep apnea (adult) (pediatric): Secondary | ICD-10-CM | POA: Diagnosis not present

## 2020-10-21 DIAGNOSIS — M545 Low back pain, unspecified: Secondary | ICD-10-CM | POA: Diagnosis not present

## 2020-10-21 DIAGNOSIS — M5431 Sciatica, right side: Secondary | ICD-10-CM | POA: Diagnosis not present

## 2020-10-29 DIAGNOSIS — K5903 Drug induced constipation: Secondary | ICD-10-CM | POA: Diagnosis not present

## 2020-10-29 DIAGNOSIS — G894 Chronic pain syndrome: Secondary | ICD-10-CM | POA: Diagnosis not present

## 2020-10-29 DIAGNOSIS — M25551 Pain in right hip: Secondary | ICD-10-CM | POA: Diagnosis not present

## 2020-10-29 DIAGNOSIS — M961 Postlaminectomy syndrome, not elsewhere classified: Secondary | ICD-10-CM | POA: Diagnosis not present

## 2020-11-09 DIAGNOSIS — R5383 Other fatigue: Secondary | ICD-10-CM | POA: Diagnosis not present

## 2020-11-09 DIAGNOSIS — R0600 Dyspnea, unspecified: Secondary | ICD-10-CM | POA: Diagnosis not present

## 2020-11-09 DIAGNOSIS — J301 Allergic rhinitis due to pollen: Secondary | ICD-10-CM | POA: Diagnosis not present

## 2020-11-09 DIAGNOSIS — G4733 Obstructive sleep apnea (adult) (pediatric): Secondary | ICD-10-CM | POA: Diagnosis not present

## 2020-11-20 DIAGNOSIS — Z20828 Contact with and (suspected) exposure to other viral communicable diseases: Secondary | ICD-10-CM | POA: Diagnosis not present

## 2020-11-20 DIAGNOSIS — R051 Acute cough: Secondary | ICD-10-CM | POA: Diagnosis not present

## 2020-11-20 DIAGNOSIS — E669 Obesity, unspecified: Secondary | ICD-10-CM | POA: Diagnosis not present

## 2020-12-10 DIAGNOSIS — J329 Chronic sinusitis, unspecified: Secondary | ICD-10-CM | POA: Diagnosis not present

## 2020-12-10 DIAGNOSIS — Z6841 Body Mass Index (BMI) 40.0 and over, adult: Secondary | ICD-10-CM | POA: Diagnosis not present

## 2020-12-10 DIAGNOSIS — J309 Allergic rhinitis, unspecified: Secondary | ICD-10-CM | POA: Diagnosis not present

## 2020-12-10 DIAGNOSIS — J40 Bronchitis, not specified as acute or chronic: Secondary | ICD-10-CM | POA: Diagnosis not present

## 2020-12-24 DIAGNOSIS — M25551 Pain in right hip: Secondary | ICD-10-CM | POA: Diagnosis not present

## 2020-12-24 DIAGNOSIS — K5903 Drug induced constipation: Secondary | ICD-10-CM | POA: Diagnosis not present

## 2020-12-24 DIAGNOSIS — M961 Postlaminectomy syndrome, not elsewhere classified: Secondary | ICD-10-CM | POA: Diagnosis not present

## 2020-12-24 DIAGNOSIS — G894 Chronic pain syndrome: Secondary | ICD-10-CM | POA: Diagnosis not present

## 2021-01-05 DIAGNOSIS — M545 Low back pain, unspecified: Secondary | ICD-10-CM | POA: Diagnosis not present

## 2021-01-05 DIAGNOSIS — M4326 Fusion of spine, lumbar region: Secondary | ICD-10-CM | POA: Diagnosis not present

## 2021-01-14 ENCOUNTER — Other Ambulatory Visit: Payer: Self-pay | Admitting: Orthopaedic Surgery

## 2021-01-14 DIAGNOSIS — M51369 Other intervertebral disc degeneration, lumbar region without mention of lumbar back pain or lower extremity pain: Secondary | ICD-10-CM

## 2021-01-14 DIAGNOSIS — M5136 Other intervertebral disc degeneration, lumbar region: Secondary | ICD-10-CM

## 2021-01-15 ENCOUNTER — Telehealth: Payer: Self-pay

## 2021-01-15 NOTE — Telephone Encounter (Signed)
Spoke with patient for [place] to screen his medications before getting him scheduled for a lumbar myelogram.  He stated an understanding that he will be here about two hours and will need a driver.  He also stated an understanding that he needs to hold Prozac for 48 hours before, and 24 hours after, the myelogram.

## 2021-01-20 DIAGNOSIS — G4733 Obstructive sleep apnea (adult) (pediatric): Secondary | ICD-10-CM | POA: Diagnosis not present

## 2021-01-20 DIAGNOSIS — S32009K Unspecified fracture of unspecified lumbar vertebra, subsequent encounter for fracture with nonunion: Secondary | ICD-10-CM | POA: Diagnosis not present

## 2021-01-25 ENCOUNTER — Ambulatory Visit
Admission: RE | Admit: 2021-01-25 | Discharge: 2021-01-25 | Disposition: A | Discharge status: Home / Self Care | Payer: PPO | Source: Ambulatory Visit | Attending: Orthopaedic Surgery | Admitting: Orthopaedic Surgery

## 2021-01-25 DIAGNOSIS — M5136 Other intervertebral disc degeneration, lumbar region: Secondary | ICD-10-CM

## 2021-01-25 DIAGNOSIS — M4326 Fusion of spine, lumbar region: Secondary | ICD-10-CM | POA: Diagnosis not present

## 2021-01-25 DIAGNOSIS — M48061 Spinal stenosis, lumbar region without neurogenic claudication: Secondary | ICD-10-CM | POA: Diagnosis not present

## 2021-01-25 MED ORDER — IOPAMIDOL (ISOVUE-M 200) INJECTION 41%
15.0000 mL | Freq: Once | INTRAMUSCULAR | Status: AC
  Administered 2021-01-25: 15 mL via INTRATHECAL

## 2021-01-25 MED ORDER — DIAZEPAM 5 MG PO TABS
10.0000 mg | ORAL_TABLET | Freq: Once | ORAL | Status: AC
  Administered 2021-01-25: 10 mg via ORAL

## 2021-01-25 NOTE — Progress Notes (Signed)
Pt reports he has been off of his prozac for at least 48 hours. Pt also reports his spinal cord stimulator has been turned off for this procedure.

## 2021-01-25 NOTE — Discharge Instructions (Signed)
Myelogram Discharge Instructions  Go home and rest quietly as needed. You may resume normal activities; however, do not exert yourself strongly or do any heavy lifting today and tomorrow.   DO NOT drive today.    You may resume your normal diet and medications unless otherwise indicated. Drink lots of extra fluids today and tomorrow.   The incidence of headache, nausea, or vomiting is about 5% (one in 20 patients).  If you develop a headache, lie flat for 24 hours and drink plenty of fluids until the headache goes away.  Caffeinated beverages may be helpful. If when you get up you still have a headache when standing, go back to bed and force fluids for another 24 hours.   If you develop severe nausea and vomiting or a headache that does not go away with the flat bedrest after 48 hours, please call [phone].   Call your physician for a follow-up appointment.  The results of your myelogram will be sent directly to your physician by the following day.  If you have any questions or if complications develop after you arrive home, please call [phone].  Discharge instructions have been explained to the patient.  The patient, or the person responsible for the patient, fully understands these instructions.   Thank you for visiting our office today.    YOU MAY RESUME YOUR PROZAC TOMORROW 01/26/21 AT 9:30 AM OR AFTER

## 2021-02-04 DIAGNOSIS — M4326 Fusion of spine, lumbar region: Secondary | ICD-10-CM | POA: Diagnosis not present

## 2021-02-04 DIAGNOSIS — Z6841 Body Mass Index (BMI) 40.0 and over, adult: Secondary | ICD-10-CM | POA: Diagnosis not present

## 2021-02-04 DIAGNOSIS — I10 Essential (primary) hypertension: Secondary | ICD-10-CM | POA: Diagnosis not present

## 2021-02-04 DIAGNOSIS — M5116 Intervertebral disc disorders with radiculopathy, lumbar region: Secondary | ICD-10-CM | POA: Diagnosis not present

## 2021-02-16 DIAGNOSIS — M5116 Intervertebral disc disorders with radiculopathy, lumbar region: Secondary | ICD-10-CM | POA: Diagnosis not present

## 2021-02-19 DIAGNOSIS — G4733 Obstructive sleep apnea (adult) (pediatric): Secondary | ICD-10-CM | POA: Diagnosis not present

## 2021-02-19 DIAGNOSIS — S32009K Unspecified fracture of unspecified lumbar vertebra, subsequent encounter for fracture with nonunion: Secondary | ICD-10-CM | POA: Diagnosis not present

## 2021-02-22 DIAGNOSIS — M961 Postlaminectomy syndrome, not elsewhere classified: Secondary | ICD-10-CM | POA: Diagnosis not present

## 2021-02-22 DIAGNOSIS — G894 Chronic pain syndrome: Secondary | ICD-10-CM | POA: Diagnosis not present

## 2021-02-22 DIAGNOSIS — K5903 Drug induced constipation: Secondary | ICD-10-CM | POA: Diagnosis not present

## 2021-02-22 DIAGNOSIS — E538 Deficiency of other specified B group vitamins: Secondary | ICD-10-CM | POA: Diagnosis not present

## 2021-02-22 DIAGNOSIS — E291 Testicular hypofunction: Secondary | ICD-10-CM | POA: Diagnosis not present

## 2021-02-22 DIAGNOSIS — M25551 Pain in right hip: Secondary | ICD-10-CM | POA: Diagnosis not present

## 2021-02-23 DIAGNOSIS — J301 Allergic rhinitis due to pollen: Secondary | ICD-10-CM | POA: Diagnosis not present

## 2021-02-23 DIAGNOSIS — R0600 Dyspnea, unspecified: Secondary | ICD-10-CM | POA: Diagnosis not present

## 2021-02-23 DIAGNOSIS — G4733 Obstructive sleep apnea (adult) (pediatric): Secondary | ICD-10-CM | POA: Diagnosis not present

## 2021-02-23 DIAGNOSIS — R5383 Other fatigue: Secondary | ICD-10-CM | POA: Diagnosis not present

## 2021-03-07 DIAGNOSIS — I10 Essential (primary) hypertension: Secondary | ICD-10-CM | POA: Diagnosis not present

## 2021-03-07 DIAGNOSIS — E785 Hyperlipidemia, unspecified: Secondary | ICD-10-CM | POA: Diagnosis not present

## 2021-03-09 DIAGNOSIS — Z6841 Body Mass Index (BMI) 40.0 and over, adult: Secondary | ICD-10-CM | POA: Diagnosis not present

## 2021-03-09 DIAGNOSIS — M4326 Fusion of spine, lumbar region: Secondary | ICD-10-CM | POA: Diagnosis not present

## 2021-03-09 DIAGNOSIS — I10 Essential (primary) hypertension: Secondary | ICD-10-CM | POA: Diagnosis not present

## 2021-03-09 DIAGNOSIS — M546 Pain in thoracic spine: Secondary | ICD-10-CM | POA: Diagnosis not present

## 2021-03-09 DIAGNOSIS — M5116 Intervertebral disc disorders with radiculopathy, lumbar region: Secondary | ICD-10-CM | POA: Diagnosis not present

## 2021-03-22 DIAGNOSIS — G894 Chronic pain syndrome: Secondary | ICD-10-CM | POA: Diagnosis not present

## 2021-03-22 DIAGNOSIS — G4733 Obstructive sleep apnea (adult) (pediatric): Secondary | ICD-10-CM | POA: Diagnosis not present

## 2021-03-22 DIAGNOSIS — K5903 Drug induced constipation: Secondary | ICD-10-CM | POA: Diagnosis not present

## 2021-03-22 DIAGNOSIS — M961 Postlaminectomy syndrome, not elsewhere classified: Secondary | ICD-10-CM | POA: Diagnosis not present

## 2021-03-22 DIAGNOSIS — S32009K Unspecified fracture of unspecified lumbar vertebra, subsequent encounter for fracture with nonunion: Secondary | ICD-10-CM | POA: Diagnosis not present

## 2021-03-22 DIAGNOSIS — M25551 Pain in right hip: Secondary | ICD-10-CM | POA: Diagnosis not present

## 2021-04-07 DIAGNOSIS — I10 Essential (primary) hypertension: Secondary | ICD-10-CM | POA: Diagnosis not present

## 2021-04-07 DIAGNOSIS — E785 Hyperlipidemia, unspecified: Secondary | ICD-10-CM | POA: Diagnosis not present

## 2021-04-08 DIAGNOSIS — M5116 Intervertebral disc disorders with radiculopathy, lumbar region: Secondary | ICD-10-CM | POA: Diagnosis not present

## 2021-04-08 DIAGNOSIS — M5106 Intervertebral disc disorders with myelopathy, lumbar region: Secondary | ICD-10-CM | POA: Diagnosis not present

## 2021-04-08 DIAGNOSIS — M961 Postlaminectomy syndrome, not elsewhere classified: Secondary | ICD-10-CM | POA: Diagnosis not present

## 2021-04-08 DIAGNOSIS — M532X6 Spinal instabilities, lumbar region: Secondary | ICD-10-CM | POA: Diagnosis not present

## 2021-04-19 DIAGNOSIS — G894 Chronic pain syndrome: Secondary | ICD-10-CM | POA: Diagnosis not present

## 2021-04-19 DIAGNOSIS — M961 Postlaminectomy syndrome, not elsewhere classified: Secondary | ICD-10-CM | POA: Diagnosis not present

## 2021-04-19 DIAGNOSIS — K5903 Drug induced constipation: Secondary | ICD-10-CM | POA: Diagnosis not present

## 2021-04-19 DIAGNOSIS — M25551 Pain in right hip: Secondary | ICD-10-CM | POA: Diagnosis not present

## 2021-04-22 DIAGNOSIS — G4733 Obstructive sleep apnea (adult) (pediatric): Secondary | ICD-10-CM | POA: Diagnosis not present

## 2021-04-22 DIAGNOSIS — S32009K Unspecified fracture of unspecified lumbar vertebra, subsequent encounter for fracture with nonunion: Secondary | ICD-10-CM | POA: Diagnosis not present

## 2021-04-29 DIAGNOSIS — Z6841 Body Mass Index (BMI) 40.0 and over, adult: Secondary | ICD-10-CM | POA: Diagnosis not present

## 2021-04-29 DIAGNOSIS — Z01818 Encounter for other preprocedural examination: Secondary | ICD-10-CM | POA: Diagnosis not present

## 2021-04-29 DIAGNOSIS — E538 Deficiency of other specified B group vitamins: Secondary | ICD-10-CM | POA: Diagnosis not present

## 2021-04-29 DIAGNOSIS — I10 Essential (primary) hypertension: Secondary | ICD-10-CM | POA: Diagnosis not present

## 2021-05-20 DIAGNOSIS — K5903 Drug induced constipation: Secondary | ICD-10-CM | POA: Diagnosis not present

## 2021-05-20 DIAGNOSIS — M25551 Pain in right hip: Secondary | ICD-10-CM | POA: Diagnosis not present

## 2021-05-20 DIAGNOSIS — M961 Postlaminectomy syndrome, not elsewhere classified: Secondary | ICD-10-CM | POA: Diagnosis not present

## 2021-05-20 DIAGNOSIS — G894 Chronic pain syndrome: Secondary | ICD-10-CM | POA: Diagnosis not present

## 2021-05-22 DIAGNOSIS — S32009K Unspecified fracture of unspecified lumbar vertebra, subsequent encounter for fracture with nonunion: Secondary | ICD-10-CM | POA: Diagnosis not present

## 2021-05-22 DIAGNOSIS — G4733 Obstructive sleep apnea (adult) (pediatric): Secondary | ICD-10-CM | POA: Diagnosis not present

## 2021-06-02 DIAGNOSIS — G8918 Other acute postprocedural pain: Secondary | ICD-10-CM | POA: Diagnosis not present

## 2021-06-02 DIAGNOSIS — M961 Postlaminectomy syndrome, not elsewhere classified: Secondary | ICD-10-CM | POA: Diagnosis not present

## 2021-06-02 DIAGNOSIS — K5903 Drug induced constipation: Secondary | ICD-10-CM | POA: Diagnosis not present

## 2021-06-02 DIAGNOSIS — M25551 Pain in right hip: Secondary | ICD-10-CM | POA: Diagnosis not present

## 2021-06-04 DIAGNOSIS — M5116 Intervertebral disc disorders with radiculopathy, lumbar region: Secondary | ICD-10-CM | POA: Diagnosis not present

## 2021-06-04 DIAGNOSIS — M545 Low back pain, unspecified: Secondary | ICD-10-CM | POA: Diagnosis not present

## 2021-06-04 DIAGNOSIS — M5106 Intervertebral disc disorders with myelopathy, lumbar region: Secondary | ICD-10-CM | POA: Diagnosis not present

## 2021-06-04 DIAGNOSIS — M532X6 Spinal instabilities, lumbar region: Secondary | ICD-10-CM | POA: Diagnosis not present

## 2021-06-04 DIAGNOSIS — I499 Cardiac arrhythmia, unspecified: Secondary | ICD-10-CM | POA: Diagnosis not present

## 2021-06-04 DIAGNOSIS — M961 Postlaminectomy syndrome, not elsewhere classified: Secondary | ICD-10-CM | POA: Diagnosis not present

## 2021-06-04 DIAGNOSIS — Z01810 Encounter for preprocedural cardiovascular examination: Secondary | ICD-10-CM | POA: Diagnosis not present

## 2021-06-04 DIAGNOSIS — Z01812 Encounter for preprocedural laboratory examination: Secondary | ICD-10-CM | POA: Diagnosis not present

## 2021-06-06 DIAGNOSIS — M545 Low back pain, unspecified: Secondary | ICD-10-CM | POA: Diagnosis not present

## 2021-06-08 DIAGNOSIS — M4716 Other spondylosis with myelopathy, lumbar region: Secondary | ICD-10-CM | POA: Diagnosis not present

## 2021-06-08 DIAGNOSIS — M4326 Fusion of spine, lumbar region: Secondary | ICD-10-CM | POA: Diagnosis not present

## 2021-06-08 DIAGNOSIS — M5106 Intervertebral disc disorders with myelopathy, lumbar region: Secondary | ICD-10-CM

## 2021-06-08 DIAGNOSIS — M48062 Spinal stenosis, lumbar region with neurogenic claudication: Secondary | ICD-10-CM | POA: Diagnosis not present

## 2021-06-08 DIAGNOSIS — E669 Obesity, unspecified: Secondary | ICD-10-CM | POA: Diagnosis not present

## 2021-06-08 DIAGNOSIS — Z6841 Body Mass Index (BMI) 40.0 and over, adult: Secondary | ICD-10-CM | POA: Diagnosis not present

## 2021-06-08 DIAGNOSIS — M5116 Intervertebral disc disorders with radiculopathy, lumbar region: Secondary | ICD-10-CM | POA: Diagnosis not present

## 2021-06-08 DIAGNOSIS — Z01818 Encounter for other preprocedural examination: Secondary | ICD-10-CM | POA: Diagnosis not present

## 2021-06-08 DIAGNOSIS — M961 Postlaminectomy syndrome, not elsewhere classified: Secondary | ICD-10-CM | POA: Diagnosis not present

## 2021-06-08 DIAGNOSIS — M4316 Spondylolisthesis, lumbar region: Secondary | ICD-10-CM | POA: Diagnosis not present

## 2021-06-08 DIAGNOSIS — Z981 Arthrodesis status: Secondary | ICD-10-CM | POA: Diagnosis not present

## 2021-06-08 DIAGNOSIS — M4726 Other spondylosis with radiculopathy, lumbar region: Secondary | ICD-10-CM | POA: Diagnosis not present

## 2021-06-08 DIAGNOSIS — Z79899 Other long term (current) drug therapy: Secondary | ICD-10-CM | POA: Diagnosis not present

## 2021-06-08 DIAGNOSIS — M532X6 Spinal instabilities, lumbar region: Secondary | ICD-10-CM | POA: Diagnosis not present

## 2021-06-08 DIAGNOSIS — M48061 Spinal stenosis, lumbar region without neurogenic claudication: Secondary | ICD-10-CM | POA: Diagnosis not present

## 2021-06-08 DIAGNOSIS — I10 Essential (primary) hypertension: Secondary | ICD-10-CM | POA: Diagnosis not present

## 2021-06-08 HISTORY — DX: Intervertebral disc disorders with myelopathy, lumbar region: M51.06

## 2021-06-14 DIAGNOSIS — M4326 Fusion of spine, lumbar region: Secondary | ICD-10-CM | POA: Diagnosis not present

## 2021-06-17 DIAGNOSIS — K5903 Drug induced constipation: Secondary | ICD-10-CM | POA: Diagnosis not present

## 2021-06-17 DIAGNOSIS — M961 Postlaminectomy syndrome, not elsewhere classified: Secondary | ICD-10-CM | POA: Diagnosis not present

## 2021-06-17 DIAGNOSIS — G8918 Other acute postprocedural pain: Secondary | ICD-10-CM | POA: Diagnosis not present

## 2021-06-17 DIAGNOSIS — M25551 Pain in right hip: Secondary | ICD-10-CM | POA: Diagnosis not present

## 2021-07-05 DIAGNOSIS — Z6841 Body Mass Index (BMI) 40.0 and over, adult: Secondary | ICD-10-CM | POA: Diagnosis not present

## 2021-07-05 DIAGNOSIS — M532X6 Spinal instabilities, lumbar region: Secondary | ICD-10-CM | POA: Diagnosis not present

## 2021-07-05 DIAGNOSIS — M4326 Fusion of spine, lumbar region: Secondary | ICD-10-CM | POA: Diagnosis not present

## 2021-07-09 DIAGNOSIS — Z981 Arthrodesis status: Secondary | ICD-10-CM | POA: Diagnosis not present

## 2021-07-09 DIAGNOSIS — Z6841 Body Mass Index (BMI) 40.0 and over, adult: Secondary | ICD-10-CM | POA: Diagnosis not present

## 2021-07-15 DIAGNOSIS — G8918 Other acute postprocedural pain: Secondary | ICD-10-CM | POA: Diagnosis not present

## 2021-07-15 DIAGNOSIS — M961 Postlaminectomy syndrome, not elsewhere classified: Secondary | ICD-10-CM | POA: Diagnosis not present

## 2021-07-15 DIAGNOSIS — G894 Chronic pain syndrome: Secondary | ICD-10-CM | POA: Diagnosis not present

## 2021-07-15 DIAGNOSIS — Z79891 Long term (current) use of opiate analgesic: Secondary | ICD-10-CM | POA: Diagnosis not present

## 2021-07-15 DIAGNOSIS — K5903 Drug induced constipation: Secondary | ICD-10-CM | POA: Diagnosis not present

## 2021-08-13 DIAGNOSIS — M961 Postlaminectomy syndrome, not elsewhere classified: Secondary | ICD-10-CM | POA: Diagnosis not present

## 2021-08-13 DIAGNOSIS — K5903 Drug induced constipation: Secondary | ICD-10-CM | POA: Diagnosis not present

## 2021-08-13 DIAGNOSIS — G894 Chronic pain syndrome: Secondary | ICD-10-CM | POA: Diagnosis not present

## 2021-08-13 DIAGNOSIS — G8918 Other acute postprocedural pain: Secondary | ICD-10-CM | POA: Diagnosis not present

## 2021-09-01 DIAGNOSIS — M4326 Fusion of spine, lumbar region: Secondary | ICD-10-CM | POA: Diagnosis not present

## 2021-09-13 DIAGNOSIS — K5903 Drug induced constipation: Secondary | ICD-10-CM | POA: Diagnosis not present

## 2021-09-13 DIAGNOSIS — M961 Postlaminectomy syndrome, not elsewhere classified: Secondary | ICD-10-CM | POA: Diagnosis not present

## 2021-09-13 DIAGNOSIS — G894 Chronic pain syndrome: Secondary | ICD-10-CM | POA: Diagnosis not present

## 2021-09-13 DIAGNOSIS — G8918 Other acute postprocedural pain: Secondary | ICD-10-CM | POA: Diagnosis not present

## 2021-09-28 DIAGNOSIS — J309 Allergic rhinitis, unspecified: Secondary | ICD-10-CM | POA: Diagnosis not present

## 2021-09-28 DIAGNOSIS — J329 Chronic sinusitis, unspecified: Secondary | ICD-10-CM | POA: Diagnosis not present

## 2021-09-28 DIAGNOSIS — Z6841 Body Mass Index (BMI) 40.0 and over, adult: Secondary | ICD-10-CM | POA: Diagnosis not present

## 2021-09-28 DIAGNOSIS — J40 Bronchitis, not specified as acute or chronic: Secondary | ICD-10-CM | POA: Diagnosis not present

## 2021-10-06 ENCOUNTER — Encounter: Payer: Self-pay | Admitting: Gastroenterology

## 2021-10-07 ENCOUNTER — Other Ambulatory Visit: Payer: Self-pay | Admitting: Orthopaedic Surgery

## 2021-10-07 DIAGNOSIS — M532X6 Spinal instabilities, lumbar region: Secondary | ICD-10-CM

## 2021-10-07 DIAGNOSIS — Z6841 Body Mass Index (BMI) 40.0 and over, adult: Secondary | ICD-10-CM | POA: Diagnosis not present

## 2021-10-07 DIAGNOSIS — M4326 Fusion of spine, lumbar region: Secondary | ICD-10-CM

## 2021-10-11 ENCOUNTER — Ambulatory Visit
Admission: RE | Admit: 2021-10-11 | Discharge: 2021-10-11 | Disposition: A | Discharge status: Home / Self Care | Payer: PPO | Source: Ambulatory Visit | Attending: Orthopaedic Surgery | Admitting: Orthopaedic Surgery

## 2021-10-11 ENCOUNTER — Other Ambulatory Visit: Payer: Self-pay

## 2021-10-11 DIAGNOSIS — M532X6 Spinal instabilities, lumbar region: Secondary | ICD-10-CM

## 2021-10-11 DIAGNOSIS — M4326 Fusion of spine, lumbar region: Secondary | ICD-10-CM | POA: Diagnosis not present

## 2021-10-11 DIAGNOSIS — M5416 Radiculopathy, lumbar region: Secondary | ICD-10-CM | POA: Diagnosis not present

## 2021-10-11 DIAGNOSIS — M5126 Other intervertebral disc displacement, lumbar region: Secondary | ICD-10-CM | POA: Diagnosis not present

## 2021-10-11 MED ORDER — MEPERIDINE HCL 50 MG/ML IJ SOLN
50.0000 mg | Freq: Once | INTRAMUSCULAR | Status: AC | PRN
  Administered 2021-10-11: 50 mg via INTRAMUSCULAR

## 2021-10-11 MED ORDER — ONDANSETRON HCL 4 MG/2ML IJ SOLN
4.0000 mg | Freq: Once | INTRAMUSCULAR | Status: AC | PRN
  Administered 2021-10-11: 4 mg via INTRAMUSCULAR

## 2021-10-11 MED ORDER — DIAZEPAM 5 MG PO TABS
10.0000 mg | ORAL_TABLET | Freq: Once | ORAL | Status: AC
  Administered 2021-10-11: 10 mg via ORAL

## 2021-10-11 MED ORDER — IOPAMIDOL (ISOVUE-M 200) INJECTION 41%
20.0000 mL | Freq: Once | INTRAMUSCULAR | Status: AC
  Administered 2021-10-11: 20 mL via INTRATHECAL

## 2021-10-11 NOTE — Progress Notes (Signed)
Pt has spinal cord stimulator and reports he has turned it off for CT myelogram procedure.   ?

## 2021-10-11 NOTE — Discharge Instr - Other Orders (Addendum)
1050: pt reports pain 8/10 in lower back from myelogram procedure. See MAR ?1103: Relief noted per patient. Pt resting comfortably in bed  ?

## 2021-10-11 NOTE — Discharge Instructions (Signed)

## 2021-10-13 DIAGNOSIS — G894 Chronic pain syndrome: Secondary | ICD-10-CM | POA: Diagnosis not present

## 2021-10-13 DIAGNOSIS — M961 Postlaminectomy syndrome, not elsewhere classified: Secondary | ICD-10-CM | POA: Diagnosis not present

## 2021-10-13 DIAGNOSIS — G8918 Other acute postprocedural pain: Secondary | ICD-10-CM | POA: Diagnosis not present

## 2021-10-13 DIAGNOSIS — K5903 Drug induced constipation: Secondary | ICD-10-CM | POA: Diagnosis not present

## 2021-10-14 DIAGNOSIS — M532X6 Spinal instabilities, lumbar region: Secondary | ICD-10-CM | POA: Diagnosis not present

## 2021-10-14 DIAGNOSIS — Z6841 Body Mass Index (BMI) 40.0 and over, adult: Secondary | ICD-10-CM | POA: Diagnosis not present

## 2021-10-14 DIAGNOSIS — M4326 Fusion of spine, lumbar region: Secondary | ICD-10-CM | POA: Diagnosis not present

## 2021-10-19 DIAGNOSIS — M4716 Other spondylosis with myelopathy, lumbar region: Secondary | ICD-10-CM | POA: Diagnosis not present

## 2021-10-19 DIAGNOSIS — M4726 Other spondylosis with radiculopathy, lumbar region: Secondary | ICD-10-CM | POA: Diagnosis not present

## 2021-10-19 DIAGNOSIS — M545 Low back pain, unspecified: Secondary | ICD-10-CM | POA: Diagnosis not present

## 2021-11-08 DIAGNOSIS — M545 Low back pain, unspecified: Secondary | ICD-10-CM | POA: Diagnosis not present

## 2021-11-08 DIAGNOSIS — M4716 Other spondylosis with myelopathy, lumbar region: Secondary | ICD-10-CM | POA: Diagnosis not present

## 2021-11-08 DIAGNOSIS — M4726 Other spondylosis with radiculopathy, lumbar region: Secondary | ICD-10-CM | POA: Diagnosis not present

## 2021-11-10 DIAGNOSIS — G8918 Other acute postprocedural pain: Secondary | ICD-10-CM | POA: Diagnosis not present

## 2021-11-10 DIAGNOSIS — G894 Chronic pain syndrome: Secondary | ICD-10-CM | POA: Diagnosis not present

## 2021-11-10 DIAGNOSIS — K5903 Drug induced constipation: Secondary | ICD-10-CM | POA: Diagnosis not present

## 2021-11-10 DIAGNOSIS — M961 Postlaminectomy syndrome, not elsewhere classified: Secondary | ICD-10-CM | POA: Diagnosis not present

## 2021-11-15 DIAGNOSIS — M4716 Other spondylosis with myelopathy, lumbar region: Secondary | ICD-10-CM | POA: Diagnosis not present

## 2021-11-15 DIAGNOSIS — M4726 Other spondylosis with radiculopathy, lumbar region: Secondary | ICD-10-CM | POA: Diagnosis not present

## 2021-11-15 DIAGNOSIS — M545 Low back pain, unspecified: Secondary | ICD-10-CM | POA: Diagnosis not present

## 2021-11-19 DIAGNOSIS — E538 Deficiency of other specified B group vitamins: Secondary | ICD-10-CM | POA: Diagnosis not present

## 2021-11-19 DIAGNOSIS — R1031 Right lower quadrant pain: Secondary | ICD-10-CM | POA: Diagnosis not present

## 2021-11-19 DIAGNOSIS — Z6841 Body Mass Index (BMI) 40.0 and over, adult: Secondary | ICD-10-CM | POA: Diagnosis not present

## 2021-11-19 DIAGNOSIS — M549 Dorsalgia, unspecified: Secondary | ICD-10-CM | POA: Diagnosis not present

## 2021-11-19 DIAGNOSIS — G8929 Other chronic pain: Secondary | ICD-10-CM | POA: Diagnosis not present

## 2021-12-01 DIAGNOSIS — M532X6 Spinal instabilities, lumbar region: Secondary | ICD-10-CM | POA: Diagnosis not present

## 2021-12-01 DIAGNOSIS — M4326 Fusion of spine, lumbar region: Secondary | ICD-10-CM | POA: Diagnosis not present

## 2021-12-01 DIAGNOSIS — M25552 Pain in left hip: Secondary | ICD-10-CM | POA: Diagnosis not present

## 2021-12-08 DIAGNOSIS — G894 Chronic pain syndrome: Secondary | ICD-10-CM | POA: Diagnosis not present

## 2021-12-08 DIAGNOSIS — G8918 Other acute postprocedural pain: Secondary | ICD-10-CM | POA: Diagnosis not present

## 2021-12-08 DIAGNOSIS — K5903 Drug induced constipation: Secondary | ICD-10-CM | POA: Diagnosis not present

## 2021-12-08 DIAGNOSIS — M961 Postlaminectomy syndrome, not elsewhere classified: Secondary | ICD-10-CM | POA: Diagnosis not present

## 2021-12-10 DIAGNOSIS — M25552 Pain in left hip: Secondary | ICD-10-CM | POA: Diagnosis not present

## 2021-12-10 DIAGNOSIS — M1612 Unilateral primary osteoarthritis, left hip: Secondary | ICD-10-CM | POA: Diagnosis not present

## 2021-12-30 DIAGNOSIS — J40 Bronchitis, not specified as acute or chronic: Secondary | ICD-10-CM | POA: Diagnosis not present

## 2021-12-30 DIAGNOSIS — J329 Chronic sinusitis, unspecified: Secondary | ICD-10-CM | POA: Diagnosis not present

## 2022-01-10 DIAGNOSIS — M4326 Fusion of spine, lumbar region: Secondary | ICD-10-CM | POA: Diagnosis not present

## 2022-01-10 DIAGNOSIS — M1612 Unilateral primary osteoarthritis, left hip: Secondary | ICD-10-CM | POA: Diagnosis not present

## 2022-01-10 DIAGNOSIS — Z6841 Body Mass Index (BMI) 40.0 and over, adult: Secondary | ICD-10-CM | POA: Diagnosis not present

## 2022-02-09 DIAGNOSIS — Z79891 Long term (current) use of opiate analgesic: Secondary | ICD-10-CM | POA: Diagnosis not present

## 2022-02-09 DIAGNOSIS — K5903 Drug induced constipation: Secondary | ICD-10-CM | POA: Diagnosis not present

## 2022-02-09 DIAGNOSIS — G894 Chronic pain syndrome: Secondary | ICD-10-CM | POA: Diagnosis not present

## 2022-02-09 DIAGNOSIS — M961 Postlaminectomy syndrome, not elsewhere classified: Secondary | ICD-10-CM | POA: Diagnosis not present

## 2022-02-09 DIAGNOSIS — G8918 Other acute postprocedural pain: Secondary | ICD-10-CM | POA: Diagnosis not present

## 2022-02-15 DIAGNOSIS — E538 Deficiency of other specified B group vitamins: Secondary | ICD-10-CM | POA: Diagnosis not present

## 2022-02-15 DIAGNOSIS — G4733 Obstructive sleep apnea (adult) (pediatric): Secondary | ICD-10-CM | POA: Diagnosis not present

## 2022-02-15 DIAGNOSIS — E669 Obesity, unspecified: Secondary | ICD-10-CM | POA: Diagnosis not present

## 2022-02-15 DIAGNOSIS — K219 Gastro-esophageal reflux disease without esophagitis: Secondary | ICD-10-CM | POA: Diagnosis not present

## 2022-02-15 DIAGNOSIS — I10 Essential (primary) hypertension: Secondary | ICD-10-CM | POA: Diagnosis not present

## 2022-02-15 DIAGNOSIS — M961 Postlaminectomy syndrome, not elsewhere classified: Secondary | ICD-10-CM | POA: Diagnosis not present

## 2022-03-10 DIAGNOSIS — M961 Postlaminectomy syndrome, not elsewhere classified: Secondary | ICD-10-CM | POA: Diagnosis not present

## 2022-03-10 DIAGNOSIS — G8918 Other acute postprocedural pain: Secondary | ICD-10-CM | POA: Diagnosis not present

## 2022-03-10 DIAGNOSIS — K5903 Drug induced constipation: Secondary | ICD-10-CM | POA: Diagnosis not present

## 2022-03-10 DIAGNOSIS — E538 Deficiency of other specified B group vitamins: Secondary | ICD-10-CM | POA: Diagnosis not present

## 2022-03-10 DIAGNOSIS — G894 Chronic pain syndrome: Secondary | ICD-10-CM | POA: Diagnosis not present

## 2022-03-16 DIAGNOSIS — Z79899 Other long term (current) drug therapy: Secondary | ICD-10-CM | POA: Diagnosis not present

## 2022-03-16 DIAGNOSIS — M549 Dorsalgia, unspecified: Secondary | ICD-10-CM | POA: Diagnosis not present

## 2022-03-16 DIAGNOSIS — M4326 Fusion of spine, lumbar region: Secondary | ICD-10-CM | POA: Diagnosis not present

## 2022-03-16 DIAGNOSIS — M160 Bilateral primary osteoarthritis of hip: Secondary | ICD-10-CM | POA: Diagnosis not present

## 2022-03-16 DIAGNOSIS — M4186 Other forms of scoliosis, lumbar region: Secondary | ICD-10-CM | POA: Diagnosis not present

## 2022-03-16 DIAGNOSIS — Z981 Arthrodesis status: Secondary | ICD-10-CM | POA: Diagnosis not present

## 2022-03-16 DIAGNOSIS — M545 Low back pain, unspecified: Secondary | ICD-10-CM | POA: Diagnosis not present

## 2022-03-16 DIAGNOSIS — M79605 Pain in left leg: Secondary | ICD-10-CM | POA: Diagnosis not present

## 2022-03-16 DIAGNOSIS — G8929 Other chronic pain: Secondary | ICD-10-CM | POA: Diagnosis not present

## 2022-03-16 DIAGNOSIS — Z791 Long term (current) use of non-steroidal anti-inflammatories (NSAID): Secondary | ICD-10-CM | POA: Diagnosis not present

## 2022-04-08 DIAGNOSIS — M545 Low back pain, unspecified: Secondary | ICD-10-CM | POA: Diagnosis not present

## 2022-04-08 DIAGNOSIS — M79605 Pain in left leg: Secondary | ICD-10-CM | POA: Diagnosis not present

## 2022-04-08 DIAGNOSIS — Z888 Allergy status to other drugs, medicaments and biological substances status: Secondary | ICD-10-CM | POA: Diagnosis not present

## 2022-04-08 DIAGNOSIS — Z0389 Encounter for observation for other suspected diseases and conditions ruled out: Secondary | ICD-10-CM | POA: Diagnosis not present

## 2022-04-08 DIAGNOSIS — Z79891 Long term (current) use of opiate analgesic: Secondary | ICD-10-CM | POA: Diagnosis not present

## 2022-04-08 DIAGNOSIS — G8929 Other chronic pain: Secondary | ICD-10-CM | POA: Diagnosis not present

## 2022-04-08 DIAGNOSIS — Z79899 Other long term (current) drug therapy: Secondary | ICD-10-CM | POA: Diagnosis not present

## 2022-04-08 DIAGNOSIS — Z981 Arthrodesis status: Secondary | ICD-10-CM | POA: Diagnosis not present

## 2022-04-15 DIAGNOSIS — K5903 Drug induced constipation: Secondary | ICD-10-CM | POA: Diagnosis not present

## 2022-04-15 DIAGNOSIS — G894 Chronic pain syndrome: Secondary | ICD-10-CM | POA: Diagnosis not present

## 2022-04-15 DIAGNOSIS — M961 Postlaminectomy syndrome, not elsewhere classified: Secondary | ICD-10-CM | POA: Diagnosis not present

## 2022-04-15 DIAGNOSIS — G8918 Other acute postprocedural pain: Secondary | ICD-10-CM | POA: Diagnosis not present

## 2022-05-12 DIAGNOSIS — Z981 Arthrodesis status: Secondary | ICD-10-CM | POA: Diagnosis not present

## 2022-05-12 DIAGNOSIS — M549 Dorsalgia, unspecified: Secondary | ICD-10-CM | POA: Diagnosis not present

## 2022-05-13 DIAGNOSIS — M961 Postlaminectomy syndrome, not elsewhere classified: Secondary | ICD-10-CM | POA: Diagnosis not present

## 2022-05-13 DIAGNOSIS — G894 Chronic pain syndrome: Secondary | ICD-10-CM | POA: Diagnosis not present

## 2022-05-13 DIAGNOSIS — G8918 Other acute postprocedural pain: Secondary | ICD-10-CM | POA: Diagnosis not present

## 2022-05-13 DIAGNOSIS — K5903 Drug induced constipation: Secondary | ICD-10-CM | POA: Diagnosis not present

## 2022-05-31 DIAGNOSIS — M961 Postlaminectomy syndrome, not elsewhere classified: Secondary | ICD-10-CM | POA: Diagnosis not present

## 2022-05-31 DIAGNOSIS — Z6841 Body Mass Index (BMI) 40.0 and over, adult: Secondary | ICD-10-CM | POA: Diagnosis not present

## 2022-05-31 DIAGNOSIS — E538 Deficiency of other specified B group vitamins: Secondary | ICD-10-CM | POA: Diagnosis not present

## 2022-07-04 DIAGNOSIS — J329 Chronic sinusitis, unspecified: Secondary | ICD-10-CM | POA: Diagnosis not present

## 2022-07-04 DIAGNOSIS — J40 Bronchitis, not specified as acute or chronic: Secondary | ICD-10-CM | POA: Diagnosis not present

## 2022-07-12 DIAGNOSIS — K432 Incisional hernia without obstruction or gangrene: Secondary | ICD-10-CM | POA: Diagnosis not present

## 2022-07-13 DIAGNOSIS — J329 Chronic sinusitis, unspecified: Secondary | ICD-10-CM | POA: Diagnosis not present

## 2022-07-13 DIAGNOSIS — E538 Deficiency of other specified B group vitamins: Secondary | ICD-10-CM | POA: Diagnosis not present

## 2022-07-13 DIAGNOSIS — Z6841 Body Mass Index (BMI) 40.0 and over, adult: Secondary | ICD-10-CM | POA: Diagnosis not present

## 2022-07-13 DIAGNOSIS — J40 Bronchitis, not specified as acute or chronic: Secondary | ICD-10-CM | POA: Diagnosis not present

## 2022-07-14 ENCOUNTER — Other Ambulatory Visit (HOSPITAL_COMMUNITY): Payer: Self-pay

## 2022-07-14 DIAGNOSIS — K5903 Drug induced constipation: Secondary | ICD-10-CM | POA: Diagnosis not present

## 2022-07-14 DIAGNOSIS — G894 Chronic pain syndrome: Secondary | ICD-10-CM | POA: Diagnosis not present

## 2022-07-14 DIAGNOSIS — Z79891 Long term (current) use of opiate analgesic: Secondary | ICD-10-CM | POA: Diagnosis not present

## 2022-07-14 DIAGNOSIS — M961 Postlaminectomy syndrome, not elsewhere classified: Secondary | ICD-10-CM | POA: Diagnosis not present

## 2022-07-14 MED ORDER — OXYCODONE HCL 10 MG PO TABS
10.0000 mg | ORAL_TABLET | ORAL | 0 refills | Status: DC | PRN
  Filled 2022-07-14: qty 180, 30d supply, fill #0

## 2022-07-19 DIAGNOSIS — M25552 Pain in left hip: Secondary | ICD-10-CM

## 2022-07-19 HISTORY — DX: Pain in left hip: M25.552

## 2022-07-21 ENCOUNTER — Other Ambulatory Visit: Payer: Self-pay | Admitting: Surgery

## 2022-07-21 DIAGNOSIS — K432 Incisional hernia without obstruction or gangrene: Secondary | ICD-10-CM

## 2022-08-12 ENCOUNTER — Other Ambulatory Visit (HOSPITAL_COMMUNITY): Payer: Self-pay

## 2022-08-12 DIAGNOSIS — K5903 Drug induced constipation: Secondary | ICD-10-CM | POA: Diagnosis not present

## 2022-08-12 DIAGNOSIS — M961 Postlaminectomy syndrome, not elsewhere classified: Secondary | ICD-10-CM | POA: Diagnosis not present

## 2022-08-12 DIAGNOSIS — G894 Chronic pain syndrome: Secondary | ICD-10-CM | POA: Diagnosis not present

## 2022-08-12 DIAGNOSIS — Z79891 Long term (current) use of opiate analgesic: Secondary | ICD-10-CM | POA: Diagnosis not present

## 2022-08-12 MED ORDER — OXYCODONE HCL 10 MG PO TABS
10.0000 mg | ORAL_TABLET | ORAL | 0 refills | Status: DC | PRN
  Filled 2022-08-12: qty 180, 30d supply, fill #0

## 2022-08-23 ENCOUNTER — Ambulatory Visit
Admission: RE | Admit: 2022-08-23 | Discharge: 2022-08-23 | Disposition: A | Discharge status: Home / Self Care | Payer: PPO | Source: Ambulatory Visit | Attending: Surgery | Admitting: Surgery

## 2022-08-23 DIAGNOSIS — K760 Fatty (change of) liver, not elsewhere classified: Secondary | ICD-10-CM | POA: Diagnosis not present

## 2022-08-23 DIAGNOSIS — K449 Diaphragmatic hernia without obstruction or gangrene: Secondary | ICD-10-CM | POA: Diagnosis not present

## 2022-08-23 DIAGNOSIS — K432 Incisional hernia without obstruction or gangrene: Secondary | ICD-10-CM

## 2022-08-26 ENCOUNTER — Other Ambulatory Visit: Payer: Self-pay | Admitting: Surgery

## 2022-08-26 DIAGNOSIS — N2889 Other specified disorders of kidney and ureter: Secondary | ICD-10-CM

## 2022-08-26 DIAGNOSIS — K432 Incisional hernia without obstruction or gangrene: Secondary | ICD-10-CM | POA: Diagnosis not present

## 2022-08-26 DIAGNOSIS — R160 Hepatomegaly, not elsewhere classified: Secondary | ICD-10-CM

## 2022-09-07 ENCOUNTER — Ambulatory Visit
Admission: RE | Admit: 2022-09-07 | Discharge: 2022-09-07 | Disposition: A | Discharge status: Home / Self Care | Payer: PPO | Source: Ambulatory Visit | Attending: Surgery | Admitting: Surgery

## 2022-09-07 DIAGNOSIS — R160 Hepatomegaly, not elsewhere classified: Secondary | ICD-10-CM | POA: Diagnosis not present

## 2022-09-07 DIAGNOSIS — K760 Fatty (change of) liver, not elsewhere classified: Secondary | ICD-10-CM | POA: Diagnosis not present

## 2022-09-07 DIAGNOSIS — N281 Cyst of kidney, acquired: Secondary | ICD-10-CM | POA: Diagnosis not present

## 2022-09-07 DIAGNOSIS — N2889 Other specified disorders of kidney and ureter: Secondary | ICD-10-CM | POA: Diagnosis not present

## 2022-09-07 DIAGNOSIS — E538 Deficiency of other specified B group vitamins: Secondary | ICD-10-CM | POA: Diagnosis not present

## 2022-09-07 MED ORDER — IOPAMIDOL (ISOVUE-300) INJECTION 61%
100.0000 mL | Freq: Once | INTRAVENOUS | Status: AC | PRN
  Administered 2022-09-07: 100 mL via INTRAVENOUS

## 2022-09-09 ENCOUNTER — Other Ambulatory Visit (HOSPITAL_COMMUNITY): Payer: Self-pay

## 2022-09-09 DIAGNOSIS — M961 Postlaminectomy syndrome, not elsewhere classified: Secondary | ICD-10-CM | POA: Diagnosis not present

## 2022-09-09 DIAGNOSIS — K5903 Drug induced constipation: Secondary | ICD-10-CM | POA: Diagnosis not present

## 2022-09-09 DIAGNOSIS — Z79891 Long term (current) use of opiate analgesic: Secondary | ICD-10-CM | POA: Diagnosis not present

## 2022-09-09 DIAGNOSIS — G894 Chronic pain syndrome: Secondary | ICD-10-CM | POA: Diagnosis not present

## 2022-09-09 MED ORDER — LINZESS 72 MCG PO CAPS
72.0000 ug | ORAL_CAPSULE | Freq: Every day | ORAL | 2 refills | Status: DC
  Filled 2022-09-09: qty 30, 30d supply, fill #0

## 2022-09-09 MED ORDER — FLUOXETINE HCL 10 MG PO CAPS
10.0000 mg | ORAL_CAPSULE | Freq: Every day | ORAL | 1 refills | Status: DC
  Filled 2022-09-09: qty 30, 30d supply, fill #0
  Filled 2023-07-31: qty 30, 30d supply, fill #1

## 2022-09-09 MED ORDER — METHOCARBAMOL 750 MG PO TABS
1500.0000 mg | ORAL_TABLET | Freq: Three times a day (TID) | ORAL | 1 refills | Status: DC | PRN
  Filled 2022-09-09: qty 180, 30d supply, fill #0
  Filled 2023-03-03: qty 180, 30d supply, fill #1

## 2022-09-09 MED ORDER — OXYCODONE HCL 10 MG PO TABS
10.0000 mg | ORAL_TABLET | ORAL | 0 refills | Status: DC | PRN
  Filled 2022-09-09: qty 180, 30d supply, fill #0

## 2022-09-13 DIAGNOSIS — E538 Deficiency of other specified B group vitamins: Secondary | ICD-10-CM | POA: Diagnosis not present

## 2022-09-13 DIAGNOSIS — Z6841 Body Mass Index (BMI) 40.0 and over, adult: Secondary | ICD-10-CM | POA: Diagnosis not present

## 2022-09-13 DIAGNOSIS — N2889 Other specified disorders of kidney and ureter: Secondary | ICD-10-CM | POA: Diagnosis not present

## 2022-09-13 DIAGNOSIS — K429 Umbilical hernia without obstruction or gangrene: Secondary | ICD-10-CM | POA: Diagnosis not present

## 2022-09-14 DIAGNOSIS — M25551 Pain in right hip: Secondary | ICD-10-CM | POA: Diagnosis not present

## 2022-09-14 DIAGNOSIS — M25552 Pain in left hip: Secondary | ICD-10-CM | POA: Diagnosis not present

## 2022-09-15 DIAGNOSIS — E538 Deficiency of other specified B group vitamins: Secondary | ICD-10-CM | POA: Diagnosis not present

## 2022-09-26 DIAGNOSIS — K219 Gastro-esophageal reflux disease without esophagitis: Secondary | ICD-10-CM | POA: Diagnosis not present

## 2022-09-26 DIAGNOSIS — Z79899 Other long term (current) drug therapy: Secondary | ICD-10-CM | POA: Diagnosis not present

## 2022-09-26 DIAGNOSIS — I480 Paroxysmal atrial fibrillation: Secondary | ICD-10-CM | POA: Diagnosis not present

## 2022-09-26 DIAGNOSIS — K429 Umbilical hernia without obstruction or gangrene: Secondary | ICD-10-CM | POA: Diagnosis not present

## 2022-09-26 DIAGNOSIS — I10 Essential (primary) hypertension: Secondary | ICD-10-CM | POA: Diagnosis not present

## 2022-09-26 DIAGNOSIS — K439 Ventral hernia without obstruction or gangrene: Secondary | ICD-10-CM | POA: Diagnosis not present

## 2022-09-26 DIAGNOSIS — E785 Hyperlipidemia, unspecified: Secondary | ICD-10-CM | POA: Diagnosis not present

## 2022-09-26 DIAGNOSIS — G4733 Obstructive sleep apnea (adult) (pediatric): Secondary | ICD-10-CM | POA: Diagnosis not present

## 2022-09-30 DIAGNOSIS — N401 Enlarged prostate with lower urinary tract symptoms: Secondary | ICD-10-CM | POA: Diagnosis not present

## 2022-09-30 DIAGNOSIS — R351 Nocturia: Secondary | ICD-10-CM | POA: Diagnosis not present

## 2022-09-30 DIAGNOSIS — N281 Cyst of kidney, acquired: Secondary | ICD-10-CM | POA: Diagnosis not present

## 2022-10-07 ENCOUNTER — Other Ambulatory Visit (HOSPITAL_COMMUNITY): Payer: Self-pay

## 2022-10-07 DIAGNOSIS — Z79891 Long term (current) use of opiate analgesic: Secondary | ICD-10-CM | POA: Diagnosis not present

## 2022-10-07 DIAGNOSIS — K5903 Drug induced constipation: Secondary | ICD-10-CM | POA: Diagnosis not present

## 2022-10-07 DIAGNOSIS — G894 Chronic pain syndrome: Secondary | ICD-10-CM | POA: Diagnosis not present

## 2022-10-07 DIAGNOSIS — M961 Postlaminectomy syndrome, not elsewhere classified: Secondary | ICD-10-CM | POA: Diagnosis not present

## 2022-10-07 MED ORDER — OXYCODONE HCL 10 MG PO TABS
10.0000 mg | ORAL_TABLET | ORAL | 0 refills | Status: DC | PRN
  Filled 2022-10-07: qty 180, 30d supply, fill #0

## 2022-10-07 MED ORDER — FLUOXETINE HCL 10 MG PO CAPS
10.0000 mg | ORAL_CAPSULE | Freq: Every day | ORAL | 1 refills | Status: DC
  Filled 2022-10-07: qty 30, 30d supply, fill #0

## 2022-10-07 MED ORDER — LINZESS 72 MCG PO CAPS
72.0000 ug | ORAL_CAPSULE | Freq: Every day | ORAL | 2 refills | Status: DC
  Filled 2022-10-07: qty 30, 30d supply, fill #0
  Filled 2023-07-31: qty 30, 30d supply, fill #1

## 2022-10-07 MED ORDER — METHOCARBAMOL 750 MG PO TABS
1500.0000 mg | ORAL_TABLET | Freq: Three times a day (TID) | ORAL | 1 refills | Status: DC | PRN
  Filled 2022-10-07: qty 180, 30d supply, fill #0
  Filled 2023-07-31: qty 180, 30d supply, fill #1

## 2022-10-10 DIAGNOSIS — N281 Cyst of kidney, acquired: Secondary | ICD-10-CM | POA: Diagnosis not present

## 2022-11-07 ENCOUNTER — Other Ambulatory Visit (HOSPITAL_COMMUNITY): Payer: Self-pay

## 2022-11-07 DIAGNOSIS — M961 Postlaminectomy syndrome, not elsewhere classified: Secondary | ICD-10-CM | POA: Diagnosis not present

## 2022-11-07 DIAGNOSIS — Z79891 Long term (current) use of opiate analgesic: Secondary | ICD-10-CM | POA: Diagnosis not present

## 2022-11-07 DIAGNOSIS — K5903 Drug induced constipation: Secondary | ICD-10-CM | POA: Diagnosis not present

## 2022-11-07 DIAGNOSIS — G894 Chronic pain syndrome: Secondary | ICD-10-CM | POA: Diagnosis not present

## 2022-11-07 MED ORDER — METHOCARBAMOL 750 MG PO TABS
1500.0000 mg | ORAL_TABLET | Freq: Three times a day (TID) | ORAL | 1 refills | Status: DC | PRN
  Filled 2022-11-07: qty 180, 30d supply, fill #0
  Filled 2022-12-06: qty 180, 30d supply, fill #1

## 2022-11-07 MED ORDER — OXYCODONE HCL 10 MG PO TABS
10.0000 mg | ORAL_TABLET | ORAL | 0 refills | Status: DC | PRN
  Filled 2022-11-07: qty 180, 30d supply, fill #0

## 2022-11-07 MED ORDER — FLUOXETINE HCL 10 MG PO CAPS
10.0000 mg | ORAL_CAPSULE | Freq: Every day | ORAL | 1 refills | Status: DC
  Filled 2022-11-07: qty 30, 30d supply, fill #0
  Filled 2022-12-06: qty 30, 30d supply, fill #1

## 2022-11-07 MED ORDER — LINZESS 72 MCG PO CAPS
72.0000 ug | ORAL_CAPSULE | Freq: Every day | ORAL | 2 refills | Status: DC
  Filled 2022-11-07: qty 30, 30d supply, fill #0
  Filled 2022-12-06: qty 30, 30d supply, fill #1

## 2022-12-06 ENCOUNTER — Other Ambulatory Visit (HOSPITAL_COMMUNITY): Payer: Self-pay

## 2022-12-06 DIAGNOSIS — M961 Postlaminectomy syndrome, not elsewhere classified: Secondary | ICD-10-CM | POA: Diagnosis not present

## 2022-12-06 DIAGNOSIS — Z79891 Long term (current) use of opiate analgesic: Secondary | ICD-10-CM | POA: Diagnosis not present

## 2022-12-06 DIAGNOSIS — G894 Chronic pain syndrome: Secondary | ICD-10-CM | POA: Diagnosis not present

## 2022-12-06 DIAGNOSIS — K5903 Drug induced constipation: Secondary | ICD-10-CM | POA: Diagnosis not present

## 2022-12-06 MED ORDER — OXYCODONE HCL 10 MG PO TABS
10.0000 mg | ORAL_TABLET | ORAL | 0 refills | Status: DC | PRN
  Filled 2022-12-06: qty 180, 30d supply, fill #0

## 2023-01-05 ENCOUNTER — Other Ambulatory Visit (HOSPITAL_COMMUNITY): Payer: Self-pay

## 2023-01-05 DIAGNOSIS — M961 Postlaminectomy syndrome, not elsewhere classified: Secondary | ICD-10-CM | POA: Diagnosis not present

## 2023-01-05 DIAGNOSIS — Z79891 Long term (current) use of opiate analgesic: Secondary | ICD-10-CM | POA: Diagnosis not present

## 2023-01-05 DIAGNOSIS — K5903 Drug induced constipation: Secondary | ICD-10-CM | POA: Diagnosis not present

## 2023-01-05 DIAGNOSIS — G894 Chronic pain syndrome: Secondary | ICD-10-CM | POA: Diagnosis not present

## 2023-01-05 MED ORDER — METHOCARBAMOL 750 MG PO TABS
1500.0000 mg | ORAL_TABLET | Freq: Three times a day (TID) | ORAL | 1 refills | Status: DC | PRN
  Filled 2023-01-05: qty 180, 30d supply, fill #0

## 2023-01-05 MED ORDER — FLUOXETINE HCL 10 MG PO CAPS
10.0000 mg | ORAL_CAPSULE | Freq: Every day | ORAL | 1 refills | Status: DC
  Filled 2023-01-05: qty 30, 30d supply, fill #0

## 2023-01-05 MED ORDER — LINZESS 72 MCG PO CAPS
72.0000 ug | ORAL_CAPSULE | Freq: Every day | ORAL | 2 refills | Status: DC
  Filled 2023-01-05: qty 30, 30d supply, fill #0

## 2023-01-05 MED ORDER — OXYCODONE HCL 10 MG PO TABS
10.0000 mg | ORAL_TABLET | ORAL | 0 refills | Status: DC | PRN
  Filled 2023-01-05: qty 180, 30d supply, fill #0

## 2023-01-17 DIAGNOSIS — M545 Low back pain, unspecified: Secondary | ICD-10-CM | POA: Diagnosis not present

## 2023-02-02 ENCOUNTER — Other Ambulatory Visit (HOSPITAL_COMMUNITY): Payer: Self-pay

## 2023-02-02 DIAGNOSIS — Z79891 Long term (current) use of opiate analgesic: Secondary | ICD-10-CM | POA: Diagnosis not present

## 2023-02-02 DIAGNOSIS — G894 Chronic pain syndrome: Secondary | ICD-10-CM | POA: Diagnosis not present

## 2023-02-02 DIAGNOSIS — M961 Postlaminectomy syndrome, not elsewhere classified: Secondary | ICD-10-CM | POA: Diagnosis not present

## 2023-02-02 DIAGNOSIS — K5903 Drug induced constipation: Secondary | ICD-10-CM | POA: Diagnosis not present

## 2023-02-02 MED ORDER — OXYCODONE-ACETAMINOPHEN 10-325 MG PO TABS
1.0000 | ORAL_TABLET | ORAL | 0 refills | Status: DC | PRN
  Filled 2023-02-02: qty 180, 30d supply, fill #0

## 2023-02-02 MED ORDER — METHOCARBAMOL 750 MG PO TABS
750.0000 mg | ORAL_TABLET | Freq: Three times a day (TID) | ORAL | 1 refills | Status: DC | PRN
  Filled 2023-02-02: qty 180, 30d supply, fill #0

## 2023-02-02 MED ORDER — LINZESS 72 MCG PO CAPS
72.0000 ug | ORAL_CAPSULE | Freq: Every day | ORAL | 2 refills | Status: DC
  Filled 2023-02-02: qty 30, 30d supply, fill #0
  Filled 2023-03-03: qty 30, 30d supply, fill #1

## 2023-02-02 MED ORDER — FLUOXETINE HCL 10 MG PO CAPS
10.0000 mg | ORAL_CAPSULE | Freq: Every day | ORAL | 1 refills | Status: DC
  Filled 2023-02-02: qty 30, 30d supply, fill #0
  Filled 2023-03-03: qty 30, 30d supply, fill #1

## 2023-02-02 MED ORDER — OXYCODONE HCL 10 MG PO TABS
10.0000 mg | ORAL_TABLET | ORAL | 0 refills | Status: DC | PRN
  Filled 2023-02-02: qty 180, 30d supply, fill #0

## 2023-03-03 ENCOUNTER — Other Ambulatory Visit (HOSPITAL_COMMUNITY): Payer: Self-pay

## 2023-03-03 DIAGNOSIS — M961 Postlaminectomy syndrome, not elsewhere classified: Secondary | ICD-10-CM | POA: Diagnosis not present

## 2023-03-03 DIAGNOSIS — Z79891 Long term (current) use of opiate analgesic: Secondary | ICD-10-CM | POA: Diagnosis not present

## 2023-03-03 DIAGNOSIS — G894 Chronic pain syndrome: Secondary | ICD-10-CM | POA: Diagnosis not present

## 2023-03-03 DIAGNOSIS — K5903 Drug induced constipation: Secondary | ICD-10-CM | POA: Diagnosis not present

## 2023-03-03 MED ORDER — OXYCODONE-ACETAMINOPHEN 10-325 MG PO TABS
1.0000 | ORAL_TABLET | ORAL | 0 refills | Status: DC | PRN
  Filled 2023-03-03: qty 180, 30d supply, fill #0

## 2023-03-09 DIAGNOSIS — N289 Disorder of kidney and ureter, unspecified: Secondary | ICD-10-CM | POA: Diagnosis not present

## 2023-03-09 DIAGNOSIS — N281 Cyst of kidney, acquired: Secondary | ICD-10-CM | POA: Diagnosis not present

## 2023-03-09 DIAGNOSIS — K760 Fatty (change of) liver, not elsewhere classified: Secondary | ICD-10-CM | POA: Diagnosis not present

## 2023-04-03 ENCOUNTER — Other Ambulatory Visit (HOSPITAL_COMMUNITY): Payer: Self-pay

## 2023-04-03 DIAGNOSIS — G894 Chronic pain syndrome: Secondary | ICD-10-CM | POA: Diagnosis not present

## 2023-04-03 DIAGNOSIS — M961 Postlaminectomy syndrome, not elsewhere classified: Secondary | ICD-10-CM | POA: Diagnosis not present

## 2023-04-03 DIAGNOSIS — K5903 Drug induced constipation: Secondary | ICD-10-CM | POA: Diagnosis not present

## 2023-04-03 DIAGNOSIS — Z79891 Long term (current) use of opiate analgesic: Secondary | ICD-10-CM | POA: Diagnosis not present

## 2023-04-03 MED ORDER — OXYCODONE HCL 10 MG PO TABS
10.0000 mg | ORAL_TABLET | ORAL | 0 refills | Status: DC | PRN
  Filled 2023-04-03: qty 180, 30d supply, fill #0

## 2023-04-03 MED ORDER — FLUOXETINE HCL 10 MG PO CAPS
10.0000 mg | ORAL_CAPSULE | Freq: Every day | ORAL | 1 refills | Status: DC
  Filled 2023-04-03: qty 30, 30d supply, fill #0

## 2023-04-03 MED ORDER — LINZESS 72 MCG PO CAPS
72.0000 ug | ORAL_CAPSULE | Freq: Every day | ORAL | 2 refills | Status: DC
  Filled 2023-04-03: qty 30, 30d supply, fill #0

## 2023-04-03 MED ORDER — METHOCARBAMOL 750 MG PO TABS
1500.0000 mg | ORAL_TABLET | Freq: Three times a day (TID) | ORAL | 1 refills | Status: DC | PRN
  Filled 2023-04-03: qty 180, 30d supply, fill #0

## 2023-04-11 DIAGNOSIS — Z1331 Encounter for screening for depression: Secondary | ICD-10-CM | POA: Diagnosis not present

## 2023-04-11 DIAGNOSIS — Z6841 Body Mass Index (BMI) 40.0 and over, adult: Secondary | ICD-10-CM | POA: Diagnosis not present

## 2023-04-11 DIAGNOSIS — E538 Deficiency of other specified B group vitamins: Secondary | ICD-10-CM | POA: Diagnosis not present

## 2023-04-11 DIAGNOSIS — Z1339 Encounter for screening examination for other mental health and behavioral disorders: Secondary | ICD-10-CM | POA: Diagnosis not present

## 2023-04-11 DIAGNOSIS — D519 Vitamin B12 deficiency anemia, unspecified: Secondary | ICD-10-CM | POA: Diagnosis not present

## 2023-04-20 ENCOUNTER — Other Ambulatory Visit (HOSPITAL_COMMUNITY): Payer: Self-pay

## 2023-04-21 ENCOUNTER — Other Ambulatory Visit (HOSPITAL_BASED_OUTPATIENT_CLINIC_OR_DEPARTMENT_OTHER): Payer: Self-pay

## 2023-04-21 ENCOUNTER — Other Ambulatory Visit (HOSPITAL_COMMUNITY): Payer: Self-pay

## 2023-04-21 MED ORDER — SEMAGLUTIDE-WEIGHT MANAGEMENT 0.25 MG/0.5ML ~~LOC~~ SOAJ
0.2500 mg | SUBCUTANEOUS | 0 refills | Status: DC
  Filled 2023-04-21 – 2023-05-02 (×2): qty 2, 28d supply, fill #0

## 2023-05-02 ENCOUNTER — Other Ambulatory Visit (HOSPITAL_COMMUNITY): Payer: Self-pay

## 2023-05-02 DIAGNOSIS — Z79891 Long term (current) use of opiate analgesic: Secondary | ICD-10-CM | POA: Diagnosis not present

## 2023-05-02 DIAGNOSIS — M961 Postlaminectomy syndrome, not elsewhere classified: Secondary | ICD-10-CM | POA: Diagnosis not present

## 2023-05-02 DIAGNOSIS — G894 Chronic pain syndrome: Secondary | ICD-10-CM | POA: Diagnosis not present

## 2023-05-02 DIAGNOSIS — K5903 Drug induced constipation: Secondary | ICD-10-CM | POA: Diagnosis not present

## 2023-05-02 MED ORDER — OXYCODONE-ACETAMINOPHEN 10-325 MG PO TABS
1.0000 | ORAL_TABLET | ORAL | 0 refills | Status: DC | PRN
  Filled 2023-05-02: qty 180, 30d supply, fill #0

## 2023-05-02 MED ORDER — METHOCARBAMOL 750 MG PO TABS
1500.0000 mg | ORAL_TABLET | Freq: Three times a day (TID) | ORAL | 1 refills | Status: DC | PRN
  Filled 2023-05-02: qty 180, 30d supply, fill #0

## 2023-05-02 MED ORDER — FLUOXETINE HCL 10 MG PO CAPS
10.0000 mg | ORAL_CAPSULE | Freq: Every day | ORAL | 1 refills | Status: DC
  Filled 2023-05-02: qty 30, 30d supply, fill #0

## 2023-06-02 ENCOUNTER — Other Ambulatory Visit (HOSPITAL_COMMUNITY): Payer: Self-pay

## 2023-06-02 DIAGNOSIS — Z79891 Long term (current) use of opiate analgesic: Secondary | ICD-10-CM | POA: Diagnosis not present

## 2023-06-02 DIAGNOSIS — G894 Chronic pain syndrome: Secondary | ICD-10-CM | POA: Diagnosis not present

## 2023-06-02 DIAGNOSIS — K5903 Drug induced constipation: Secondary | ICD-10-CM | POA: Diagnosis not present

## 2023-06-02 DIAGNOSIS — M961 Postlaminectomy syndrome, not elsewhere classified: Secondary | ICD-10-CM | POA: Diagnosis not present

## 2023-06-02 MED ORDER — LINZESS 72 MCG PO CAPS
72.0000 ug | ORAL_CAPSULE | Freq: Every day | ORAL | 2 refills | Status: DC
  Filled 2023-06-02: qty 30, 30d supply, fill #0

## 2023-06-02 MED ORDER — FLUOXETINE HCL 10 MG PO CAPS
10.0000 mg | ORAL_CAPSULE | Freq: Every day | ORAL | 1 refills | Status: DC
  Filled 2023-06-02: qty 30, 30d supply, fill #0

## 2023-06-02 MED ORDER — METHOCARBAMOL 750 MG PO TABS
1500.0000 mg | ORAL_TABLET | Freq: Three times a day (TID) | ORAL | 1 refills | Status: DC | PRN
  Filled 2023-06-02: qty 180, 30d supply, fill #0

## 2023-06-05 ENCOUNTER — Other Ambulatory Visit (HOSPITAL_COMMUNITY): Payer: Self-pay

## 2023-06-05 MED ORDER — OXYCODONE-ACETAMINOPHEN 10-325 MG PO TABS
1.0000 | ORAL_TABLET | ORAL | 0 refills | Status: DC
  Filled 2023-06-05: qty 180, 30d supply, fill #0

## 2023-06-07 DIAGNOSIS — Z6841 Body Mass Index (BMI) 40.0 and over, adult: Secondary | ICD-10-CM | POA: Diagnosis not present

## 2023-06-07 DIAGNOSIS — F1190 Opioid use, unspecified, uncomplicated: Secondary | ICD-10-CM | POA: Diagnosis not present

## 2023-06-07 DIAGNOSIS — E538 Deficiency of other specified B group vitamins: Secondary | ICD-10-CM | POA: Diagnosis not present

## 2023-06-07 DIAGNOSIS — J0120 Acute ethmoidal sinusitis, unspecified: Secondary | ICD-10-CM | POA: Diagnosis not present

## 2023-06-21 DIAGNOSIS — N401 Enlarged prostate with lower urinary tract symptoms: Secondary | ICD-10-CM | POA: Diagnosis not present

## 2023-06-21 DIAGNOSIS — R351 Nocturia: Secondary | ICD-10-CM | POA: Diagnosis not present

## 2023-06-21 DIAGNOSIS — N2889 Other specified disorders of kidney and ureter: Secondary | ICD-10-CM | POA: Diagnosis not present

## 2023-06-27 ENCOUNTER — Other Ambulatory Visit (HOSPITAL_COMMUNITY): Payer: Self-pay

## 2023-06-30 ENCOUNTER — Other Ambulatory Visit (HOSPITAL_COMMUNITY): Payer: Self-pay

## 2023-06-30 DIAGNOSIS — M961 Postlaminectomy syndrome, not elsewhere classified: Secondary | ICD-10-CM | POA: Diagnosis not present

## 2023-06-30 DIAGNOSIS — Z79891 Long term (current) use of opiate analgesic: Secondary | ICD-10-CM | POA: Diagnosis not present

## 2023-06-30 DIAGNOSIS — G894 Chronic pain syndrome: Secondary | ICD-10-CM | POA: Diagnosis not present

## 2023-06-30 DIAGNOSIS — M4608 Spinal enthesopathy, sacral and sacrococcygeal region: Secondary | ICD-10-CM | POA: Diagnosis not present

## 2023-06-30 MED ORDER — METHOCARBAMOL 750 MG PO TABS
1500.0000 mg | ORAL_TABLET | Freq: Three times a day (TID) | ORAL | 1 refills | Status: DC | PRN
  Filled 2023-06-30: qty 180, 30d supply, fill #0

## 2023-06-30 MED ORDER — LINZESS 72 MCG PO CAPS
72.0000 ug | ORAL_CAPSULE | Freq: Every day | ORAL | 2 refills | Status: DC
  Filled 2023-06-30: qty 30, 30d supply, fill #0

## 2023-06-30 MED ORDER — OXYCODONE-ACETAMINOPHEN 10-325 MG PO TABS
1.0000 | ORAL_TABLET | ORAL | 0 refills | Status: DC | PRN
  Filled 2023-07-04: qty 180, 30d supply, fill #0

## 2023-06-30 MED ORDER — FLUOXETINE HCL 10 MG PO CAPS
10.0000 mg | ORAL_CAPSULE | Freq: Every day | ORAL | 1 refills | Status: DC
  Filled 2023-06-30: qty 30, 30d supply, fill #0

## 2023-07-04 ENCOUNTER — Other Ambulatory Visit (HOSPITAL_COMMUNITY): Payer: Self-pay

## 2023-07-04 ENCOUNTER — Other Ambulatory Visit (HOSPITAL_BASED_OUTPATIENT_CLINIC_OR_DEPARTMENT_OTHER): Payer: Self-pay

## 2023-07-28 ENCOUNTER — Other Ambulatory Visit (HOSPITAL_COMMUNITY): Payer: Self-pay

## 2023-07-28 MED ORDER — OXYCODONE-ACETAMINOPHEN 10-325 MG PO TABS
1.0000 | ORAL_TABLET | ORAL | 0 refills | Status: DC | PRN
  Filled 2023-08-01: qty 180, 30d supply, fill #0

## 2023-07-31 ENCOUNTER — Other Ambulatory Visit (HOSPITAL_COMMUNITY): Payer: Self-pay

## 2023-08-01 ENCOUNTER — Other Ambulatory Visit: Payer: Self-pay

## 2023-08-01 ENCOUNTER — Other Ambulatory Visit (HOSPITAL_COMMUNITY): Payer: Self-pay

## 2023-08-09 DIAGNOSIS — I2510 Atherosclerotic heart disease of native coronary artery without angina pectoris: Secondary | ICD-10-CM | POA: Insufficient documentation

## 2023-08-09 HISTORY — DX: Atherosclerotic heart disease of native coronary artery without angina pectoris: I25.10

## 2023-08-30 ENCOUNTER — Other Ambulatory Visit (HOSPITAL_COMMUNITY): Payer: Self-pay

## 2023-08-30 DIAGNOSIS — Z79891 Long term (current) use of opiate analgesic: Secondary | ICD-10-CM | POA: Diagnosis not present

## 2023-08-30 DIAGNOSIS — M4608 Spinal enthesopathy, sacral and sacrococcygeal region: Secondary | ICD-10-CM | POA: Diagnosis not present

## 2023-08-30 DIAGNOSIS — G894 Chronic pain syndrome: Secondary | ICD-10-CM | POA: Diagnosis not present

## 2023-08-30 DIAGNOSIS — M961 Postlaminectomy syndrome, not elsewhere classified: Secondary | ICD-10-CM | POA: Diagnosis not present

## 2023-08-30 MED ORDER — OXYCODONE-ACETAMINOPHEN 10-325 MG PO TABS
1.0000 | ORAL_TABLET | ORAL | 0 refills | Status: DC | PRN
  Filled 2023-08-30: qty 180, 30d supply, fill #0

## 2023-08-30 MED ORDER — METHOCARBAMOL 750 MG PO TABS
1500.0000 mg | ORAL_TABLET | Freq: Three times a day (TID) | ORAL | 1 refills | Status: DC
  Filled 2023-08-30: qty 180, 30d supply, fill #0

## 2023-08-30 MED ORDER — FLUOXETINE HCL 10 MG PO CAPS
10.0000 mg | ORAL_CAPSULE | Freq: Every day | ORAL | 1 refills | Status: DC
  Filled 2023-08-30: qty 30, 30d supply, fill #0

## 2023-08-30 MED ORDER — LINZESS 72 MCG PO CAPS
72.0000 ug | ORAL_CAPSULE | Freq: Every day | ORAL | 2 refills | Status: DC
  Filled 2023-08-30: qty 30, 30d supply, fill #0

## 2023-09-27 ENCOUNTER — Other Ambulatory Visit (HOSPITAL_COMMUNITY): Payer: Self-pay

## 2023-09-27 DIAGNOSIS — Z79891 Long term (current) use of opiate analgesic: Secondary | ICD-10-CM | POA: Diagnosis not present

## 2023-09-27 DIAGNOSIS — M4608 Spinal enthesopathy, sacral and sacrococcygeal region: Secondary | ICD-10-CM | POA: Diagnosis not present

## 2023-09-27 DIAGNOSIS — G894 Chronic pain syndrome: Secondary | ICD-10-CM | POA: Diagnosis not present

## 2023-09-27 DIAGNOSIS — M961 Postlaminectomy syndrome, not elsewhere classified: Secondary | ICD-10-CM | POA: Diagnosis not present

## 2023-09-27 MED ORDER — LINZESS 72 MCG PO CAPS
72.0000 ug | ORAL_CAPSULE | Freq: Every day | ORAL | 2 refills | Status: DC
  Filled 2023-09-27: qty 30, 30d supply, fill #0

## 2023-09-27 MED ORDER — OXYCODONE-ACETAMINOPHEN 10-325 MG PO TABS
1.0000 | ORAL_TABLET | ORAL | 0 refills | Status: DC | PRN
  Filled 2023-09-27: qty 180, 30d supply, fill #0

## 2023-09-27 MED ORDER — METHOCARBAMOL 750 MG PO TABS
1500.0000 mg | ORAL_TABLET | Freq: Three times a day (TID) | ORAL | 1 refills | Status: DC | PRN
  Filled 2023-09-27: qty 180, 30d supply, fill #0

## 2023-09-27 MED ORDER — FLUOXETINE HCL 10 MG PO CAPS
10.0000 mg | ORAL_CAPSULE | Freq: Every day | ORAL | 1 refills | Status: DC
  Filled 2023-09-27: qty 30, 30d supply, fill #0

## 2023-10-01 ENCOUNTER — Emergency Department (HOSPITAL_BASED_OUTPATIENT_CLINIC_OR_DEPARTMENT_OTHER): Payer: PPO | Admitting: Radiology

## 2023-10-01 ENCOUNTER — Other Ambulatory Visit: Payer: Self-pay

## 2023-10-01 ENCOUNTER — Emergency Department (HOSPITAL_BASED_OUTPATIENT_CLINIC_OR_DEPARTMENT_OTHER)
Admission: EM | Admit: 2023-10-01 | Discharge: 2023-10-01 | Disposition: A | Discharge status: Home / Self Care | Payer: PPO

## 2023-10-01 ENCOUNTER — Emergency Department (HOSPITAL_BASED_OUTPATIENT_CLINIC_OR_DEPARTMENT_OTHER): Payer: PPO

## 2023-10-01 ENCOUNTER — Encounter (HOSPITAL_BASED_OUTPATIENT_CLINIC_OR_DEPARTMENT_OTHER): Payer: Self-pay

## 2023-10-01 DIAGNOSIS — I10 Essential (primary) hypertension: Secondary | ICD-10-CM | POA: Insufficient documentation

## 2023-10-01 DIAGNOSIS — R0789 Other chest pain: Secondary | ICD-10-CM | POA: Diagnosis not present

## 2023-10-01 DIAGNOSIS — Z79899 Other long term (current) drug therapy: Secondary | ICD-10-CM | POA: Diagnosis not present

## 2023-10-01 DIAGNOSIS — R079 Chest pain, unspecified: Secondary | ICD-10-CM | POA: Diagnosis not present

## 2023-10-01 DIAGNOSIS — R002 Palpitations: Secondary | ICD-10-CM | POA: Diagnosis not present

## 2023-10-01 DIAGNOSIS — R918 Other nonspecific abnormal finding of lung field: Secondary | ICD-10-CM | POA: Diagnosis not present

## 2023-10-01 LAB — CBC
HCT: 44.7 % (ref 39.0–52.0)
Hemoglobin: 15.9 g/dL (ref 13.0–17.0)
MCH: 30.6 pg (ref 26.0–34.0)
MCHC: 35.6 g/dL (ref 30.0–36.0)
MCV: 86.1 fL (ref 80.0–100.0)
Platelets: 273 10*3/uL (ref 150–400)
RBC: 5.19 MIL/uL (ref 4.22–5.81)
RDW: 11.8 % (ref 11.5–15.5)
WBC: 10.7 10*3/uL — ABNORMAL HIGH (ref 4.0–10.5)
nRBC: 0 % (ref 0.0–0.2)

## 2023-10-01 LAB — BASIC METABOLIC PANEL
Anion gap: 9 (ref 5–15)
BUN: 7 mg/dL (ref 6–20)
CO2: 29 mmol/L (ref 22–32)
Calcium: 9.6 mg/dL (ref 8.9–10.3)
Chloride: 100 mmol/L (ref 98–111)
Creatinine, Ser: 0.72 mg/dL (ref 0.61–1.24)
GFR, Estimated: >60 mL/min (ref >60)
Glucose, Bld: 109 mg/dL — ABNORMAL HIGH (ref 70–99)
Potassium: 3.2 mmol/L — ABNORMAL LOW (ref 3.5–5.1)
Sodium: 138 mmol/L (ref 135–145)

## 2023-10-01 LAB — RESP PANEL BY RT-PCR (RSV, FLU A&B, COVID)  RVPGX2
Influenza A by PCR: NEGATIVE
Influenza B by PCR: NEGATIVE
Resp Syncytial Virus by PCR: NEGATIVE
SARS Coronavirus 2 by RT PCR: NEGATIVE

## 2023-10-01 LAB — TROPONIN I (HIGH SENSITIVITY)
Troponin I (High Sensitivity): 4 ng/L (ref <18)
Troponin I (High Sensitivity): 4 ng/L (ref <18)

## 2023-10-01 LAB — MAGNESIUM: Magnesium: 1.9 mg/dL (ref 1.7–2.4)

## 2023-10-01 MED ORDER — POTASSIUM CHLORIDE CRYS ER 20 MEQ PO TBCR
40.0000 meq | EXTENDED_RELEASE_TABLET | Freq: Once | ORAL | Status: AC
  Administered 2023-10-01: 40 meq via ORAL
  Filled 2023-10-01: qty 2

## 2023-10-01 MED ORDER — DIPHENHYDRAMINE HCL 50 MG/ML IJ SOLN
25.0000 mg | Freq: Once | INTRAMUSCULAR | Status: AC
  Administered 2023-10-01: 25 mg via INTRAVENOUS
  Filled 2023-10-01: qty 1

## 2023-10-01 MED ORDER — ACETAMINOPHEN 500 MG PO TABS
1000.0000 mg | ORAL_TABLET | Freq: Once | ORAL | Status: AC
  Administered 2023-10-01: 1000 mg via ORAL
  Filled 2023-10-01: qty 2

## 2023-10-01 MED ORDER — IOHEXOL 350 MG/ML SOLN
75.0000 mL | Freq: Once | INTRAVENOUS | Status: AC | PRN
  Administered 2023-10-01: 75 mL via INTRAVENOUS

## 2023-10-01 MED ORDER — METOCLOPRAMIDE HCL 5 MG/ML IJ SOLN
10.0000 mg | Freq: Once | INTRAMUSCULAR | Status: AC
  Administered 2023-10-01: 10 mg via INTRAVENOUS
  Filled 2023-10-01: qty 2

## 2023-10-01 NOTE — ED Provider Notes (Signed)
 [place] EMERGENCY DEPARTMENT AT [facility] Provider Note   CSN: [id number] Arrival date & time: 10/01/23  2130     History {Add pertinent medical, surgical, social history, OB history to HPI:1} No chief complaint on file.   [name] is a 53 y.o. male.  HPI   53 year old male presents emergency department with complaints of chest pain, palpitations.  States that he has been having irregular heart rhythm for the past 2 weeks or so.  States that he intermittently goes into A-fib as he has a known history.  Patient not currently anticoagulated.  States that when he goes into it it can last an hour or the day but does not typically last longer than that.  Patient reports yesterday developing left-sided chest pain with some radiation to his left shoulder as well as left jaw.  States that he was sitting in the passenger seat of the car when the symptoms began around 8 PM.  States he last around 20 minutes or so before symptoms resolved.  Does report some associated nausea.  States that he had additional episode this morning when he was in the grocery store prompting visit to the emergency department.  States that he has followed with a cardiologist the outpatient setting and his last nuclear stress test was around 5 years ago of which she states was normal.  Patient does state that he has family history of MI in their 31s with multiple family members.  Denies any abdominal pain, vomiting, shortness of breath, urinary symptoms, change in bowel habits.  Patient states that his chest pain has resolved.  Complaining of posterior headache which radiates to his forehead region.  States it is similar to other headaches in the past.  Denies any visual symptoms, slurred speech, facial droop, weakness/sensory deficit of lower extremities, gait abnormality.  Past medical history significant for hypertension, anxiety, chronic back pain, GERD, hyperlipidemia, hypertension, pancreatitis,  idiopathic thrombocytopenia, OSA  Home Medications Prior to Admission medications   Medication Sig Start Date End Date Taking? Authorizing Provider  amLODipine (NORVASC) 10 MG tablet TAKE 1 TABLET BY MOUTH DAILY * NEEDS APPT FOR REFILLS 05/18/20 05/18/21  Luna Kitchens A, MD  amLODipine-benazepril (LOTREL) 10-20 MG capsule TAKE 1 CAPSULE BY MOUTH AT BEDTIME FOR HIGH BLOOD PRESSURE. 07/01/20 07/01/21  Luna Kitchens A, MD  cyanocobalamin (,VITAMIN B-12,) 1000 MCG/ML injection Inject 1,000 mcg into the muscle every 14 (fourteen) days.  Patient not taking: Reported on 01/15/2021 11/11/15   [provider]  DEPO-TESTOSTERONE 200 MG/ML injection Inject 200 mg into the muscle every 14 (fourteen) days.  Patient not taking: Reported on 01/15/2021 12/10/15   [provider]  famotidine (PEPCID) 20 MG tablet Take 1 tablet (20 mg total) by mouth 2 (two) times daily. Patient not taking: Reported on 01/15/2021 06/13/19   Cirigliano, Vito V, DO  fenofibrate 160 MG tablet Take 160 mg by mouth daily. 07/26/17   [provider]  FLUoxetine (PROZAC) 10 MG capsule Take 10 mg by mouth daily.    [provider]  FLUoxetine (PROZAC) 10 MG capsule Take 1 capsule (10 mg total) by mouth daily. 09/09/22     FLUoxetine (PROZAC) 10 MG capsule Take 1 capsule (10 mg total) by mouth daily. 10/07/22     FLUoxetine (PROZAC) 10 MG capsule Take 1 capsule (10 mg total) by mouth daily. 11/07/22     FLUoxetine (PROZAC) 10 MG capsule Take 1 capsule (10 mg total) by mouth daily. 01/05/23  FLUoxetine (PROZAC) 10 MG capsule Take 1 capsule (10 mg) by mouth daily. 02/02/23     FLUoxetine (PROZAC) 10 MG capsule Take 1 capsule (10 mg total) by mouth daily. 04/03/23     FLUoxetine (PROZAC) 10 MG capsule Take 1 capsule (10 mg total) by mouth daily. 05/02/23     FLUoxetine (PROZAC) 10 MG capsule Take 1 capsule (10 mg total) by mouth daily. 06/02/23     FLUoxetine (PROZAC) 10 MG capsule Take 1 capsule (10 mg total) by mouth  daily. 06/30/23     FLUoxetine (PROZAC) 10 MG capsule Take 1 capsule (10 mg total) by mouth daily. 08/30/23     FLUoxetine (PROZAC) 10 MG capsule Take 1 capsule (10 mg total) by mouth daily. 09/27/23     Icosapent Ethyl (VASCEPA) 1 g CAPS Take 2 capsules by mouth 2 (two) times daily.    [provider]  linaclotide (LINZESS) 72 MCG capsule Take 1 capsule (72 mcg total) by mouth daily. 09/09/22     linaclotide (LINZESS) 72 MCG capsule Take 1 capsule (72 mcg total) by mouth daily. 10/07/22     linaclotide (LINZESS) 72 MCG capsule Take 1 capsule (72 mcg total) by mouth daily. 11/07/22     linaclotide (LINZESS) 72 MCG capsule Take 1 capsule (72 mcg total) by mouth daily. 01/05/23     linaclotide (LINZESS) 72 MCG capsule Take 1 capsule (72 mcg) by mouth daily. 02/02/23     linaclotide (LINZESS) 72 MCG capsule Take 1 capsule (72 mcg total) by mouth daily. 04/03/23     linaclotide (LINZESS) 72 MCG capsule Take 1 capsule (72 mcg total) by mouth daily. 06/02/23     linaclotide (LINZESS) 72 MCG capsule Take 1 capsule (72 mcg total) by mouth daily. 06/30/23     linaclotide (LINZESS) 72 MCG capsule Take 1 capsule (72 mcg total) by mouth daily. 08/30/23     linaclotide (LINZESS) 72 MCG capsule Take 1 capsule (72 mcg total) by mouth daily. 09/27/23     lisinopril (ZESTRIL) 20 MG tablet TAKE 1 TABLET BY MOUTH ONCE DAILY 07/01/20 07/01/21  [name], MD  methocarbamol (ROBAXIN) 750 MG tablet Take 2 tablets (1,500 mg total) by mouth 3 (three) times daily as needed. 09/09/22     methocarbamol (ROBAXIN) 750 MG tablet Take 2 tablets (1,500 mg total) by mouth 3 (three) times daily as needed. 10/07/22     methocarbamol (ROBAXIN) 750 MG tablet Take 2 tablets (1,500 mg total) by mouth 3 (three) times daily as needed. 11/07/22     methocarbamol (ROBAXIN) 750 MG tablet Take 2 tablets (1,500 mg total) by mouth 3 (three) times daily as needed. 01/05/23     methocarbamol (ROBAXIN) 750 MG tablet Take 2 tablets (750 mg) by mouth 3  times daily as needed. 02/02/23     methocarbamol (ROBAXIN) 750 MG tablet Take 2 tablets (1,500 mg total) by mouth 3 (three) times daily as needed. 04/03/23     methocarbamol (ROBAXIN) 750 MG tablet Take 2 tablets (1,500 mg total) by mouth 3 (three) times daily as needed. 05/02/23     methocarbamol (ROBAXIN) 750 MG tablet Take 2 tablets (1,500 mg total) by mouth 3 (three) times daily as needed. 06/02/23     methocarbamol (ROBAXIN) 750 MG tablet Take 2 tablets (1,500 mg total) by mouth 3 (three) times daily as needed. 06/30/23     methocarbamol (ROBAXIN) 750 MG tablet Take 2 tablets (1,500 mg total) by mouth 3 (three) times daily. 08/30/23  methocarbamol (ROBAXIN) 750 MG tablet Take 2 tablets (1,500 mg total) by mouth 3 (three) times daily as needed. 09/27/23     montelukast (SINGULAIR) 10 MG tablet Take 10 mg by mouth at bedtime.    [provider]  omeprazole (PRILOSEC) 20 MG capsule Take 20 mg by mouth daily.  04/01/19   [provider]  Oxycodone HCl 10 MG TABS Take 10 mg by mouth every 4 (four) hours as needed (pain).  12/31/15   [provider]  Oxycodone HCl 10 MG TABS Take 1 tablet (10 mg) by mouth every 4 hours as needed for pain. Stop percocet. 02/02/23     Oxycodone HCl 10 MG TABS Take 1 tablet (10 mg total) by mouth every 4 (four) hours as needed for pain (stop percocet) 04/03/23     oxyCODONE-acetaminophen (PERCOCET) 10-325 MG tablet Take 1 tablet by mouth every 4 (four) hours as needed for pain (stop oxycodone) 02/02/23     oxyCODONE-acetaminophen (PERCOCET) 10-325 MG tablet Take 1 tablet by mouth every 4 (four) hours as needed for pain.  Do not take with Oxycodone. 03/03/23     oxyCODONE-acetaminophen (PERCOCET) 10-325 MG tablet Take 1 tablet by mouth every 4 (four) hours as needed  for pain.  **Stop oxycodone** 05/02/23     oxyCODONE-acetaminophen (PERCOCET) 10-325 MG tablet Take 1 tablet by mouth every 4 hours as needed for pain --stop oxycodone 06/05/23      oxyCODONE-acetaminophen (PERCOCET) 10-325 MG tablet Take 1 tablet by mouth every 4 (four) hours as needed for pain. 09/27/23     pregabalin (LYRICA) 50 MG capsule Take 50 mg by mouth 2 (two) times daily. 01/05/21   [provider]  Semaglutide-Weight Management 0.25 MG/0.5ML SOAJ Inject 0.25 mg into the skin once a week. 04/11/23     testosterone cypionate (DEPOTESTOSTERONE CYPIONATE) 200 MG/ML injection INJECT 0.5 MLS 2 TIMES A MONTH AS DIRECTED 06/03/20 11/30/20  [name], MD      Allergies    Nexium [esomeprazole magnesium] and Wellbutrin [bupropion]    Review of Systems   Review of Systems  All other systems reviewed and are negative.   Physical Exam Updated Vital Signs There were no vitals taken for this visit. Physical Exam Vitals and nursing note reviewed.  Constitutional:      General: He is not in acute distress.    Appearance: He is well-developed. He is obese.  HENT:     Head: Normocephalic and atraumatic.  Eyes:     Conjunctiva/sclera: Conjunctivae normal.  Cardiovascular:     Rate and Rhythm: Normal rate and regular rhythm.     Heart sounds: No murmur heard. Pulmonary:     Effort: Pulmonary effort is normal. No respiratory distress.     Breath sounds: Normal breath sounds. No wheezing, rhonchi or rales.  Abdominal:     Palpations: Abdomen is soft.     Tenderness: There is no abdominal tenderness.  Musculoskeletal:        General: No swelling.     Cervical back: Neck supple.     Right lower leg: No edema.     Left lower leg: No edema.  Skin:    General: Skin is warm and dry.     Capillary Refill: Capillary refill takes less than 2 seconds.  Neurological:     Mental Status: He is alert.     Comments: Alert and oriented to self, place, time and event.   Speech is fluent, clear without dysarthria or dysphasia.  Strength 5/5 in upper/lower extremities  Sensation intact in upper/lower extremities   Normal gait.  CN I not tested  CN II not  tested CN III, IV, VI PERRLA and EOMs intact bilaterally  CN V Intact sensation to sharp and light touch to the face  CN VII facial movements symmetric  CN VIII not tested  CN IX, X no uvula deviation, symmetric rise of soft palate  CN XI 5/5 SCM and trapezius strength bilaterally  CN XII Midline tongue protrusion, symmetric L/R movements   Psychiatric:        Mood and Affect: Mood normal.    ED Results / Procedures / Treatments   Labs (all labs ordered are listed, but only abnormal results are displayed) Labs Reviewed - No data to display  EKG None  Radiology No results found.  Procedures Procedures  {Document cardiac monitor, telemetry assessment procedure when appropriate:1}  Medications Ordered in ED Medications - No data to display  ED Course/ Medical Decision Making/ A&P   {   Click here for ABCD2, HEART and other calculatorsREFRESH Note before signing :1}                              Medical Decision Making Amount and/or Complexity of Data Reviewed Labs: ordered. Radiology: ordered.  Risk OTC drugs. Prescription drug management.   This patient presents to the ED for concern of chest pain, this involves an extensive number of treatment options, and is a complaint that carries with it a high risk of complications and morbidity.  The differential diagnosis includes ACS, PE, pneumothorax, pericarditis/myocarditis/tamponade, pneumonia, musculoskeletal, aortic dissection, other   Co morbidities that complicate the patient evaluation  See HPI   Additional history obtained:  Additional history obtained from EMR External records from outside source obtained and reviewed including hospital records   Lab Tests:  I Ordered, and personally interpreted labs.  The pertinent results include: Leukocytosis of 10.7.  No evidence of anemia.  Platelets within range.  Mild hypokalemia 3.2 otherwise, electrolytes within normal limits.  No renal dysfunction.  Initial  troponin of 4 with repeat 4.  Viral testing negative   Imaging Studies ordered:  I ordered imaging studies including chest x-ray, CT angio chest PE I independently visualized and interpreted imaging which showed  Chest x-ray: Hazy right lower lobe opacity.  Rounded radiodense projecting medially anterior to the level of T9/10. CT angio chest PE: No acute abnormality I agree with the radiologist interpretation  Cardiac Monitoring: / EKG:  The patient was maintained on a cardiac monitor.  I personally viewed and interpreted the cardiac monitored which showed an underlying rhythm of: Sinus rhythm with PVC.  Intraventricular conduction delay.   Consultations Obtained:  See ED course  Problem List / ED Course / Critical interventions / Medication management  Chest pain I ordered medication including potassium chloride, Reglan, Benadryl, Tylenol   Reevaluation of the patient after these medicines showed that the patient improved I have reviewed the patients home medicines and have made adjustments as needed   Social Determinants of Health:  Cigarette use.  Denies illicit drug use.   Test / Admission - Considered:  Chest pain Vitals signs significant for hypertension blood pressure 147/91. Otherwise within normal range and stable throughout visit. Laboratory/imaging studies significant for: See above *** Worrisome signs and symptoms were discussed with the patient, and the patient acknowledged understanding to return to the ED if noticed. Patient was stable  upon discharge.    {Document critical care time when appropriate:1} {Document review of labs and clinical decision tools ie heart score, Chads2Vasc2 etc:1}  {Document your independent review of radiology images, and any outside records:1} {Document your discussion with family members, caretakers, and with consultants:1} {Document social determinants of health affecting pt's care:1} {Document your decision making why or why  not admission, treatments were needed:1} Final Clinical Impression(s) / ED Diagnoses Final diagnoses:  None    Rx / DC Orders ED Discharge Orders     None

## 2023-10-01 NOTE — ED Notes (Signed)
 Patient transported to CT

## 2023-10-01 NOTE — ED Triage Notes (Signed)
 Patient arrives ambulatory to the ED with complaints of feeling like he is having occurrences of irregular heart rhythm x2 weeks. Patient also reports some chest pain overnight that was going into his jaw. No current chest pain. Reports he just "feels sick" at this time.    Hx of atrial fib

## 2023-10-01 NOTE — Discharge Instructions (Signed)
 As discussed, your workup today was overall reassuring.  Your heart enzymes are normal and EKG was reassuring; does not appear like you are having a heart attack.  CT scan of your chest did not show evidence of blood clot in the lung, collapsed lung, pneumonia or other abnormality.  Will send in referral for cardiology given your risk factors as well as family history.  Please do not hesitate to return to emergency department if the worrisome signs and symptoms we discussed become apparent.

## 2023-10-03 ENCOUNTER — Ambulatory Visit: Payer: PPO | Attending: Cardiology | Admitting: Cardiology

## 2023-10-03 ENCOUNTER — Encounter: Payer: Self-pay | Admitting: Cardiology

## 2023-10-03 VITALS — BP 142/84 | HR 84 | Ht 73.0 in | Wt 346.4 lb

## 2023-10-03 DIAGNOSIS — R072 Precordial pain: Secondary | ICD-10-CM

## 2023-10-03 DIAGNOSIS — E782 Mixed hyperlipidemia: Secondary | ICD-10-CM | POA: Diagnosis not present

## 2023-10-03 DIAGNOSIS — I480 Paroxysmal atrial fibrillation: Secondary | ICD-10-CM

## 2023-10-03 DIAGNOSIS — I10 Essential (primary) hypertension: Secondary | ICD-10-CM

## 2023-10-03 DIAGNOSIS — Z7901 Long term (current) use of anticoagulants: Secondary | ICD-10-CM | POA: Diagnosis not present

## 2023-10-03 MED ORDER — METOPROLOL TARTRATE 100 MG PO TABS: 100.0000 mg | ORAL_TABLET | Freq: Once | ORAL | 0 refills | Status: DC

## 2023-10-03 MED ORDER — METOPROLOL SUCCINATE ER 25 MG PO TB24: 25.0000 mg | ORAL_TABLET | Freq: Every day | ORAL | 3 refills | Status: DC

## 2023-10-03 MED ORDER — ASPIRIN 81 MG PO TBEC: 81.0000 mg | DELAYED_RELEASE_TABLET | Freq: Every day | ORAL | 3 refills | Status: AC

## 2023-10-03 MED ORDER — ROSUVASTATIN CALCIUM 10 MG PO TABS: 10.0000 mg | ORAL_TABLET | Freq: Every day | ORAL | 3 refills | Status: DC

## 2023-10-03 MED ORDER — NITROGLYCERIN 0.4 MG SL SUBL
0.4000 mg | SUBLINGUAL_TABLET | SUBLINGUAL | 1 refills | Status: AC | PRN
Start: 2023-10-03 — End: ?

## 2023-10-03 NOTE — Progress Notes (Signed)
 Cardiology Office Note:    Date:  10/03/2023   ID:  [name], DOB 06-Dec-1970, MRN [id number]  PCP:  [name], MD  Cardiologist:  [name], MD   Referring MD: Peter Garter, PA  ASSESSMENT:    1. Precordial pain   2. Essential hypertension   3. Mixed hyperlipidemia   4. PAF (paroxysmal atrial fibrillation) (HCC)   5. Chronic anticoagulation    PLAN:    In order of problems listed above:  I would describe his chest pain is probable angina although he was not diagnosed as ACS I think he is best served with a cardiac CTA to be facilitated in the interim he will take aspirin daily 81 mg start a low-dose beta-blocker Toprol-XL 25 mg and statin therapy rosuvastatin 10 mg/day pending results. If he has severe flow-limiting stenosis would benefit from revascularization Continue his current antihypertensives  Next appointment   Medication Adjustments/Labs and Tests Ordered: Current medicines are reviewed at length with the patient today.  Concerns regarding medicines are outlined above.  Orders Placed This Encounter  Procedures   CT CORONARY MORPH W/CTA COR W/SCORE W/CA W/CM &/OR WO/CM   Basic Metabolic Panel (BMET)   EKG 12-Lead   Meds ordered this encounter  Medications   aspirin EC 81 MG tablet    Sig: Take 1 tablet (81 mg total) by mouth daily. Swallow whole.    Dispense:  90 tablet    Refill:  3   nitroGLYCERIN (NITROSTAT) 0.4 MG SL tablet    Sig: Place 1 tablet (0.4 mg total) under the tongue every 5 (five) minutes as needed for chest pain.    Dispense:  25 tablet    Refill:  1   metoprolol succinate (TOPROL XL) 25 MG 24 hr tablet    Sig: Take 1 tablet (25 mg total) by mouth daily.    Dispense:  90 tablet    Refill:  3   rosuvastatin (CRESTOR) 10 MG tablet    Sig: Take 1 tablet (10 mg total) by mouth daily.    Dispense:  90 tablet    Refill:  3   metoprolol tartrate (LOPRESSOR) 100 MG tablet    Sig: Take 1 tablet (100 mg total) by mouth once  for 1 dose. Please take this medication 2 hours before CT.    Dispense:  1 tablet    Refill:  0     No chief complaint on file.   History of Present Illness:    [name] is a 53 y.o. male who is being seen today for the evaluation of chest pain at the request of Peter Garter, [place].  I had seen him in 2018 for paroxysmal atrial flutter ablation and a strong family history of premature CAD myocardial perfusion study showing no findings of ischemia or infarction.  An event monitor showed symptomatic PVCs with signs of atrial fibrillation  He was [facility] for chest pain 10/01/2023 reports a history of hypertension hyperlipidemia and paroxysmal atrial relation with antiregulation.  He complained of left-sided chest pain rating to the left shoulder and the left jaw symptoms began at rest lasted about 20 minutes and spontaneously resolved and then recurred later his evaluation included EKG interpreted as sinus rhythm nonspecific should delay and PVCs oratory studies included a very low high-sensitivity troponin and no change on the delta CBC showed a hemoglobin potassium is decreased at 3.2 creatinine 0.7 sodium he had a chest CTA performed  which was technically limited but showed no findings of large central pulmonary vascular structures and lung  The day of his ED visit he was not feeling well he is constipated he took Linzess and delayed going to the hospital because of bowel movement he had typical anginal symptoms pressure of the chest rating to left shoulder left neck he took aspirin at home and by the time he was seen he was symptom-free.  He has had no recurrent chest pain but does not feel well ever since that episode he is quite apprehensive about the potential of heart disease.  No palpitation edema shortness of breath or syncope.  Past Medical History:  Diagnosis Date   Abdominal pain, left lower quadrant 06/21/2013   Anxiety    Arthritis    back area    Blood dyscrasia    thrombocytopenia  15 months   Chest pain in adult 03/24/2017   Chronic back pain    Complication of anesthesia    noticed some memory loss after surgery 07/18/12   Depression    on prozac   Dysrhythmia    PAF in 2012, felt related to OSA--now on CPAP; PRN follow-up Dr. Rosanne Sack 03/2011   Essential hypertension    Dr. Consuello Masse, [facility]     GERD (gastroesophageal reflux disease)    uses baking soda   Hyperlipidemia    Hypertension    Dr. 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   PAF (paroxysmal atrial fibrillation) (HCC) 03/24/2017   In 2012 related to sleep apnea     Pain of left hip joint 07/19/2022   Pancreatitis    hx of   Pneumonia    hx of  2005ish   Pseudoarthrosis of lumbar spine 10/02/2019   Sleep apnea    sleep study approx 18 months ago, uses cpap   Status post cervical spinal fusion 10/02/2019   Thrombocytopenia, idiopathic (HCC)    age of 33 months    Past Surgical History:  Procedure Laterality Date   APPENDECTOMY     53 months of age and removed scar tissue   BACK SURGERY     x2 fusions, L5-S1, L4-L5, a total of 6 per patient    CHOLECYSTECTOMY     DIAGNOSTIC LAPAROSCOPY     exploratory lab, abdominal   HERNIA REPAIR Left    inguinal   REFRACTIVE SURGERY Bilateral 98   SACROILIAC JOINT FUSION Right 11/21/2013   Procedure: RIGHT SACROILIAC JOINT FUSION;  Surgeon: Emilee Hero, MD;  Location: [facility] OR;  Service: Orthopedics;  Laterality: Right;  Right sided sacroiliac joint fusion   SACROILIAC JOINT FUSION Left 01/22/2014   Procedure: SACROILIAC JOINT FUSION;  Surgeon: Emilee Hero, MD;  Location: [facility] OR;  Service: Orthopedics;  Laterality: Left;  Left sided sacroiliac joint fusion   SPINAL CORD STIMULATOR IMPLANT  07/18/2012   SPINAL CORD STIMULATOR INSERTION  07/18/2012   Procedure: LUMBAR SPINAL CORD STIMULATOR  INSERTION;  Surgeon: Venita Lick, MD;  Location: [facility] OR;  Service: Orthopedics;  Laterality: N/A;  trial spinal cord stimulator implant    SPINAL CORD STIMULATOR REMOVAL  07/26/2012   Procedure: LUMBAR SPINAL CORD STIMULATOR REMOVAL;  Surgeon: Venita Lick, MD;  Location: [facility] OR;  Service: Orthopedics;  Laterality: N/A;  REMOVAL OF TRIAL LEAD OR CONVERSION TO PERM SPINAL CORD STIMULATOR IMPLANT   TONSILLECTOMY     VASECTOMY      Current Medications:  Current Meds  Medication Sig   acetaminophen (TYLENOL) 500 MG tablet Take 1,000 mg by mouth every 4 (four) hours as needed.   aspirin EC 81 MG tablet Take 1 tablet (81 mg total) by mouth daily. Swallow whole.   Azelastine HCl 137 MCG/SPRAY SOLN Place 1 spray into the nose 2 (two) times daily as needed.   cyanocobalamin (VITAMIN B12) 1000 MCG/ML injection Inject 100 mcg into the muscle every 30 (thirty) days.   diclofenac (VOLTAREN) 75 MG EC tablet Take 75 mg by mouth 2 (two) times daily with a meal.   fenofibrate 160 MG tablet Take 160 mg by mouth daily.   FLUoxetine (PROZAC) 10 MG capsule Take 10 mg by mouth daily.   FLUoxetine (PROZAC) 10 MG capsule Take 1 capsule (10 mg total) by mouth daily.   Fluoxetine HCl, PMDD, 10 MG TABS Take 1 tablet by mouth daily.   Icosapent Ethyl (VASCEPA) 1 g CAPS Take 2 capsules by mouth 2 (two) times daily.   linaclotide (LINZESS) 72 MCG capsule Take 1 capsule (72 mcg total) by mouth daily.   methocarbamol (ROBAXIN) 750 MG tablet Take 2 tablets (1,500 mg total) by mouth 3 (three) times daily as needed.   metoprolol succinate (TOPROL XL) 25 MG 24 hr tablet Take 1 tablet (25 mg total) by mouth daily.   metoprolol tartrate (LOPRESSOR) 100 MG tablet Take 1 tablet (100 mg total) by mouth once for 1 dose. Please take this medication 2 hours before CT.   Multiple Vitamin (MULTI-VITAMIN) tablet Take 1 tablet by mouth daily.   naloxegol oxalate (MOVANTIK) 25 MG TABS tablet Take 12.5 mg by mouth daily as needed.    nitroGLYCERIN (NITROSTAT) 0.4 MG SL tablet Place 1 tablet (0.4 mg total) under the tongue every 5 (five) minutes as needed for chest pain.   oxyCODONE (ROXICODONE) 15 MG immediate release tablet Take 15 mg by mouth every 4 (four) hours as needed for pain.   oxyCODONE-acetaminophen (PERCOCET) 10-325 MG tablet Take 1 tablet by mouth every 4 (four) hours as needed for pain.   oxymorphone (OPANA ER) 10 MG 12 hr tablet Take 10 mg by mouth every 12 (twelve) hours.   rosuvastatin (CRESTOR) 10 MG tablet Take 1 tablet (10 mg total) by mouth daily.   Current Facility-Administered Medications for the 10/03/23 encounter (Office Visit) with Baldo Daub, MD  Medication   triamcinolone acetonide (KENALOG) 10 MG/ML injection 10 mg   triamcinolone acetonide (KENALOG) 10 MG/ML injection 10 mg   triamcinolone acetonide (KENALOG) 10 MG/ML injection 10 mg     Allergies:   Nexium [esomeprazole magnesium] and Wellbutrin [bupropion]   Social History   Socioeconomic History   Marital status: Legally Separated    Spouse name: Not on file   Number of children: 3   Years of education: Not on file   Highest education level: Not on file  Occupational History   Not on file  Tobacco Use   Smoking status: Some Days    Current packs/day: 1.00    Average packs/day: 1 pack/day for 18.0 years (18.0 ttl pk-yrs)    Types: Cigarettes   Smokeless tobacco: Never  Vaping Use   Vaping status: Never Used  Substance and Sexual Activity   Alcohol use: No   Drug use: No   Sexual activity: Yes  Other Topics Concern   Not on file  Social History Narrative   Not on file   Social Drivers of Health   Financial Resource Strain: Not on file  Food Insecurity: Not on file  Transportation Needs: Not on file  Physical Activity: Not on file  Stress: Not on file  Social Connections: Not on file     Family History: The patient's family history includes Heart attack in his brother and mother. There is no history of Colon  cancer or Esophageal cancer.  ROS:   ROS Please see the history of present illness.     All other systems reviewed and are negative.  EKGs/Labs/Other Studies Reviewed:    The following studies were reviewed today:  EKG Interpretation Date/Time:  Tuesday October 03 2023 10:37:19 EST Ventricular Rate:  84 PR Interval:  160 QRS Duration:  106 QT Interval:  370 QTC Calculation: 437 R Axis:   38  Text Interpretation: Normal sinus rhythm Normal ECG When compared with ECG of 01-Oct-2023 09:34, unchanged Confirmed by [name] ([id number]) on 10/03/2023 10:52:06 AM     Physical Exam:    VS:  BP (!) 142/84   Pulse 84   Ht 6\' 1"  (1.854 m)   Wt (!) 346 lb 6.4 oz (157.1 kg)   SpO2 94%   BMI 45.70 kg/m     Wt Readings from Last 3 Encounters:  10/03/23 (!) 346 lb 6.4 oz (157.1 kg)  10/01/23 (!) 330 lb (149.7 kg)  10/02/19 287 lb (130.2 kg)     GEN: He looks fatigued obese BMI exceeds 45 well nourished, well developed in no acute distress HEENT: Normal NECK: No JVD; No carotid bruits LYMPHATICS: No lymphadenopathy CARDIAC: Distant heart sounds RRR, no murmurs, rubs, gallops RESPIRATORY:  Clear to auscultation without rales, wheezing or rhonchi  ABDOMEN: Soft, non-tender, non-distended MUSCULOSKELETAL:  No edema; No deformity  SKIN: Warm and dry NEUROLOGIC:  Alert and oriented x 3 PSYCHIATRIC:  Normal affect     Signed, [name], MD  10/03/2023 11:27 AM    [facility]

## 2023-10-03 NOTE — Patient Instructions (Signed)
 Medication Instructions:  Your physician has recommended you make the following change in your medication:   START: Aspirin 81 mg daily START: Nitroglycerin 0.4 mg under the tongue every 5 minutes x 3 as needed for chest pain START: Toprol XL 25 mg daily  START: Rosuvastatin 10 mg daily  *If you need a refill on your cardiac medications before your next appointment, please call your pharmacy*   Lab Work: Your physician recommends that you return for lab work in:   Labs today: BMP  If you have labs (blood work) drawn today and your tests are completely normal, you will receive your results only by: MyChart Message (if you have MyChart) OR A paper copy in the mail If you have any lab test that is abnormal or we need to change your treatment, we will call you to review the results.   Testing/Procedures:   Your cardiac CT will be scheduled at one of the below locations:   [facility] [address] [place], [place] [address] [phone]  OR  [facility] [address] [place], [place] [address] [phone]  OR   [facility] [address] [place], [place] [address] [phone]  OR   [facility] [address] [place], [place] [address] [phone]  If scheduled at [facility], please arrive at the [facility] and Children's Entrance (Entrance C2) of [facility] 30 minutes prior to test start time. You can use the FREE valet parking offered at entrance C (encouraged to control the heart rate for the test)  Proceed to the [facility] Radiology Department (first floor) to check-in and test prep.  All radiology patients and guests should use entrance C2 at [facility], accessed from [facility], even though the hospital's physical address listed is [address].    If scheduled at [facility] or  [facility], please arrive 15 mins early for check-in and test prep.  There is spacious parking and easy access to the radiology department from the [facility] entrance. Please enter here and check-in with the desk attendant.   If scheduled at [facility], please arrive 30 minutes early for check-in and test prep.  Please follow these instructions carefully (unless otherwise directed):  An IV will be required for this test and Nitroglycerin will be given.  Hold all erectile dysfunction medications at least 3 days (72 hrs) prior to test. (Ie viagra, cialis, sildenafil, tadalafil, etc)   On the Night Before the Test: Be sure to Drink plenty of water. Do not consume any caffeinated/decaffeinated beverages or chocolate 12 hours prior to your test. Do not take any antihistamines 12 hours prior to your test.  On the Day of the Test: Drink plenty of water until 1 hour prior to the test. Do not eat any food 1 hour prior to test. You may take your regular medications prior to the test.  Take metoprolol (Lopressor) two hours prior to test. Patients who wear a continuous glucose monitor MUST remove the device prior to scanning.      After the Test: Drink plenty of water. After receiving IV contrast, you may experience a mild flushed feeling. This is normal. On occasion, you may experience a mild rash up to 24 hours after the test. This is not dangerous. If this occurs, you can take Benadryl 25 mg, Zyrtec, Claritin, or Allegra and  increase your fluid intake. (Patients taking Tikosyn should avoid Benadryl, and may take Zyrtec, Claritin, or Allegra) If you experience trouble breathing, this can be serious. If it is severe call 911 IMMEDIATELY. If it is mild, please call our office.  We will call to schedule your test 2-4 weeks out understanding that some insurance companies will need an authorization prior to the service being performed.   For more  information and frequently asked questions, please visit our website : [email or web address]  For non-scheduling related questions, please contact the cardiac imaging nurse navigator should you have any questions/concerns: Cardiac Imaging Nurse Navigators Direct Office Dial: [phone]   For scheduling needs, including cancellations and rescheduling, please call [place], [phone].    Follow-Up: At [facility], you and your health needs are our priority.  As part of our continuing mission to provide you with exceptional heart care, we have created designated Provider Care Teams.  These Care Teams include your primary Cardiologist (physician) and Advanced Practice Providers (APPs -  Physician Assistants and Nurse Practitioners) who all work together to provide you with the care you need, when you need it.  We recommend signing up for the patient portal called "MyChart".  Sign up information is provided on this After Visit Summary.  MyChart is used to connect with patients for Virtual Visits (Telemedicine).  Patients are able to view lab/test results, encounter notes, upcoming appointments, etc.  Non-urgent messages can be sent to your provider as well.   To learn more about what you can do with MyChart, go to [email or web address].    Your next appointment:   6 week(s)  Provider:   [name], MD    Other Instructions None

## 2023-10-04 ENCOUNTER — Telehealth: Payer: Self-pay | Admitting: Cardiology

## 2023-10-04 LAB — BASIC METABOLIC PANEL
BUN/Creatinine Ratio: 11 (ref 9–20)
BUN: 6 mg/dL (ref 6–24)
CO2: 21 mmol/L (ref 20–29)
Calcium: 9.6 mg/dL (ref 8.7–10.2)
Chloride: 104 mmol/L (ref 96–106)
Creatinine, Ser: 0.56 mg/dL — ABNORMAL LOW (ref 0.76–1.27)
Glucose: 114 mg/dL — ABNORMAL HIGH (ref 70–99)
Potassium: 3.6 mmol/L (ref 3.5–5.2)
Sodium: 142 mmol/L (ref 134–144)
eGFR: 119 mL/min/{1.73_m2} (ref >59)

## 2023-10-04 NOTE — Telephone Encounter (Signed)
 Patient informed of results.

## 2023-10-04 NOTE — Telephone Encounter (Signed)
 Patient is returning call to discuss labs.

## 2023-10-05 ENCOUNTER — Other Ambulatory Visit (HOSPITAL_BASED_OUTPATIENT_CLINIC_OR_DEPARTMENT_OTHER): Payer: Self-pay | Admitting: Urology

## 2023-10-05 DIAGNOSIS — Z131 Encounter for screening for diabetes mellitus: Secondary | ICD-10-CM | POA: Diagnosis not present

## 2023-10-05 DIAGNOSIS — R1310 Dysphagia, unspecified: Secondary | ICD-10-CM | POA: Diagnosis not present

## 2023-10-05 DIAGNOSIS — R0689 Other abnormalities of breathing: Secondary | ICD-10-CM | POA: Diagnosis not present

## 2023-10-05 DIAGNOSIS — T189XXA Foreign body of alimentary tract, part unspecified, initial encounter: Secondary | ICD-10-CM | POA: Diagnosis not present

## 2023-10-05 DIAGNOSIS — Z6841 Body Mass Index (BMI) 40.0 and over, adult: Secondary | ICD-10-CM | POA: Diagnosis not present

## 2023-10-05 DIAGNOSIS — I209 Angina pectoris, unspecified: Secondary | ICD-10-CM | POA: Diagnosis not present

## 2023-10-05 DIAGNOSIS — N2889 Other specified disorders of kidney and ureter: Secondary | ICD-10-CM

## 2023-10-05 DIAGNOSIS — Z5321 Procedure and treatment not carried out due to patient leaving prior to being seen by health care provider: Secondary | ICD-10-CM | POA: Diagnosis not present

## 2023-10-05 DIAGNOSIS — E781 Pure hyperglyceridemia: Secondary | ICD-10-CM | POA: Diagnosis not present

## 2023-10-05 LAB — LAB REPORT - SCANNED: A1c: 5.5

## 2023-10-10 DIAGNOSIS — Z6841 Body Mass Index (BMI) 40.0 and over, adult: Secondary | ICD-10-CM | POA: Diagnosis not present

## 2023-10-10 DIAGNOSIS — R1319 Other dysphagia: Secondary | ICD-10-CM | POA: Diagnosis not present

## 2023-10-10 DIAGNOSIS — K219 Gastro-esophageal reflux disease without esophagitis: Secondary | ICD-10-CM | POA: Diagnosis not present

## 2023-10-16 ENCOUNTER — Telehealth (HOSPITAL_COMMUNITY): Payer: Self-pay | Admitting: *Deleted

## 2023-10-16 NOTE — Telephone Encounter (Signed)
 Attempted to call patient regarding upcoming cardiac CT appointment. Left message on voicemail with name and callback number  Larey Brick RN Navigator Cardiac Imaging [facility] [phone] Office [phone] Cell

## 2023-10-17 ENCOUNTER — Ambulatory Visit (HOSPITAL_COMMUNITY)
Admission: RE | Admit: 2023-10-17 | Discharge: 2023-10-17 | Disposition: A | Discharge status: Home / Self Care | Payer: PPO | Source: Ambulatory Visit | Attending: Cardiology | Admitting: Cardiology

## 2023-10-17 DIAGNOSIS — N281 Cyst of kidney, acquired: Secondary | ICD-10-CM | POA: Diagnosis not present

## 2023-10-17 DIAGNOSIS — N289 Disorder of kidney and ureter, unspecified: Secondary | ICD-10-CM | POA: Diagnosis not present

## 2023-10-17 DIAGNOSIS — K7689 Other specified diseases of liver: Secondary | ICD-10-CM | POA: Diagnosis not present

## 2023-10-17 DIAGNOSIS — K760 Fatty (change of) liver, not elsewhere classified: Secondary | ICD-10-CM | POA: Diagnosis not present

## 2023-10-17 DIAGNOSIS — N2889 Other specified disorders of kidney and ureter: Secondary | ICD-10-CM | POA: Insufficient documentation

## 2023-10-17 DIAGNOSIS — R072 Precordial pain: Secondary | ICD-10-CM | POA: Diagnosis not present

## 2023-10-17 MED ORDER — NITROGLYCERIN 0.4 MG SL SUBL
0.8000 mg | SUBLINGUAL_TABLET | Freq: Once | SUBLINGUAL | Status: AC
  Administered 2023-10-17: 0.8 mg via SUBLINGUAL

## 2023-10-17 MED ORDER — METOPROLOL TARTRATE 5 MG/5ML IV SOLN: 10.0000 mg | Freq: Once | INTRAVENOUS | Status: DC | PRN

## 2023-10-17 MED ORDER — DILTIAZEM HCL 25 MG/5ML IV SOLN: 10.0000 mg | INTRAVENOUS | Status: DC | PRN

## 2023-10-17 MED ORDER — IOHEXOL 350 MG/ML SOLN
120.0000 mL | Freq: Once | INTRAVENOUS | Status: AC | PRN
  Administered 2023-10-17: 120 mL via INTRAVENOUS

## 2023-10-17 MED ORDER — NITROGLYCERIN 0.4 MG SL SUBL
SUBLINGUAL_TABLET | SUBLINGUAL | Status: AC
  Filled 2023-10-17: qty 2

## 2023-10-25 ENCOUNTER — Other Ambulatory Visit (HOSPITAL_COMMUNITY): Payer: Self-pay

## 2023-10-25 DIAGNOSIS — G894 Chronic pain syndrome: Secondary | ICD-10-CM | POA: Diagnosis not present

## 2023-10-25 DIAGNOSIS — Z79891 Long term (current) use of opiate analgesic: Secondary | ICD-10-CM | POA: Diagnosis not present

## 2023-10-25 DIAGNOSIS — M4608 Spinal enthesopathy, sacral and sacrococcygeal region: Secondary | ICD-10-CM | POA: Diagnosis not present

## 2023-10-25 DIAGNOSIS — M961 Postlaminectomy syndrome, not elsewhere classified: Secondary | ICD-10-CM | POA: Diagnosis not present

## 2023-10-25 MED ORDER — METHOCARBAMOL 750 MG PO TABS
1500.0000 mg | ORAL_TABLET | Freq: Three times a day (TID) | ORAL | 1 refills | Status: DC | PRN
  Filled 2023-10-25: qty 180, 30d supply, fill #0
  Filled 2023-11-23: qty 180, 30d supply, fill #1

## 2023-10-25 MED ORDER — FLUOXETINE HCL 10 MG PO CAPS
10.0000 mg | ORAL_CAPSULE | Freq: Every day | ORAL | 1 refills | Status: DC
  Filled 2023-10-25: qty 30, 30d supply, fill #0
  Filled 2023-11-23: qty 30, 30d supply, fill #1

## 2023-10-25 MED ORDER — OXYCODONE-ACETAMINOPHEN 10-325 MG PO TABS
1.0000 | ORAL_TABLET | ORAL | 0 refills | Status: DC | PRN
  Filled 2023-10-25: qty 180, 30d supply, fill #0

## 2023-10-26 ENCOUNTER — Other Ambulatory Visit (HOSPITAL_COMMUNITY): Payer: Self-pay

## 2023-11-02 DIAGNOSIS — Z6841 Body Mass Index (BMI) 40.0 and over, adult: Secondary | ICD-10-CM | POA: Diagnosis not present

## 2023-11-02 DIAGNOSIS — E538 Deficiency of other specified B group vitamins: Secondary | ICD-10-CM | POA: Diagnosis not present

## 2023-11-02 DIAGNOSIS — R002 Palpitations: Secondary | ICD-10-CM | POA: Diagnosis not present

## 2023-11-02 DIAGNOSIS — J309 Allergic rhinitis, unspecified: Secondary | ICD-10-CM | POA: Diagnosis not present

## 2023-11-06 ENCOUNTER — Telehealth: Payer: Self-pay | Admitting: Cardiology

## 2023-11-06 NOTE — Telephone Encounter (Signed)
 Patient informed of testing results.

## 2023-11-06 NOTE — Telephone Encounter (Signed)
Patient returned RN Richard's call.

## 2023-11-10 ENCOUNTER — Other Ambulatory Visit: Payer: Self-pay

## 2023-11-10 DIAGNOSIS — I10 Essential (primary) hypertension: Secondary | ICD-10-CM | POA: Insufficient documentation

## 2023-11-11 ENCOUNTER — Other Ambulatory Visit (HOSPITAL_COMMUNITY): Payer: Self-pay

## 2023-11-13 ENCOUNTER — Encounter: Payer: Self-pay | Admitting: Cardiology

## 2023-11-13 NOTE — Progress Notes (Unsigned)
 Cardiology Office Note:  .   Date:  11/14/2023  ID:  [name], DOB 1971-06-19, MRN [id number] PCP: David Auer, MD  [facility] Providers Cardiologist:  David Herrlich, MD    History of Present Illness: .   [name] is a 53 y.o. male with a past medical history of nonobstructive CAD per coronary CTA, hypertension, PAF, OSA, GERD, idiopathic thrombocytopenia, depression.  10/17/2023 coronary CTA calcium score 2.75, 55th percentile, TPV mild 04/05/2017 monitor predominantly in sinus rhythm, 1 symptomatic event associated with APCs, 2 symptomatic events associated with frequent PVCs 04/05/2017 Lexiscan no evidence of ischemia or infarction, EF 40%  Most recently he was evaluated by Dr. Dulce Meyers on 10/03/2023, he was having episodes of chest pain, decision was made to proceed with a coronary CTA which revealed mild nonobstructive CAD.  He presents today for follow-up of his CAD.  He has been doing stable since he was last evaluated in our office.  We did review his recent coronary CTA, which was overall reassuring.  He is still bothered by pin point chest pain, was evaluated by his PCP and is currently wearing a monitor.  He said he was advised he had atrial fibrillation in the past but he is not on a DOAC.  For my review, I can see a monitor he wore in 2018 that revealed symptomatic events associated with PACs, he said he was told he has atrial fibrillation. He denies chest pain, palpitations, dyspnea, pnd, orthopnea, n, v, dizziness, syncope, edema, weight gain, or early satiety.    ROS: Review of Systems  Cardiovascular:  Positive for chest pain.  Musculoskeletal:  Positive for back pain.     Studies Reviewed: .        Cardiac Studies & Procedures   ______________________________________________________________________________________________   STRESS TESTS  MYOCARDIAL PERFUSION IMAGING 04/05/2017  Narrative  Nuclear stress EF: 48%. The left ventricular ejection  fraction is mildly decreased (45-54%).  There is no evidence of ischemia or infarction. The LV function is mildly reduced.  This is a low risk study.      MONITORS  CARDIAC EVENT MONITOR 04/05/2017  Narrative Dates: 05/06/17- 05/04/17 Indication: palpitation Ordering: Dr [name] Interpreting: Dr [name]  Baseline transmission:Sinus rhythm  Atrial arrhythmia:1 symptomatic event with occasional APC's  Ventricular arrhythmia: 2 symptomatic events with frequent PVC's  Conduction abnormality: none  Bradycardia: none  Symptoms:22 episodes of palpitation, SOB, lightheaded and fatigue   Conclusion: Symptomatic PVC's, although the symptomatic events were predominantly unassociated with arrhythmia   CT SCANS  CT CORONARY MORPH W/CTA COR W/SCORE 10/17/2023  Addendum 11/02/2023 11:38 PM ADDENDUM REPORT: 11/02/2023 23:36  EXAM: OVER-READ INTERPRETATION  CT CHEST  The following report is an over-read performed by radiologist Dr. Alcide Meyers of [facility] on 11/02/2023. This over-read does not include interpretation of cardiac or coronary anatomy or pathology. The coronary calcium score/coronary CTA interpretation by the cardiologist is attached.  COMPARISON:  None.  FINDINGS: Cardiovascular: There are no significant extracardiac vascular findings.  Mediastinum/Nodes: There are no enlarged lymph nodes within the visualized mediastinum.  Lungs/Pleura: There is no pleural effusion. The visualized lungs appear clear.  Upper abdomen: No significant findings in the visualized upper abdomen.  Musculoskeletal/Chest wall: No chest wall mass or suspicious osseous findings within the visualized chest. Spinal stimulator is noted in place.  IMPRESSION: No significant extracardiac findings within the visualized chest.   Electronically Signed By: [name] M.D. On: 11/02/2023 23:36  Narrative CLINICAL DATA:  Chest  pain  EXAM: Cardiac/Coronary  CTA  TECHNIQUE: A non-contrast, gated CT scan was obtained with axial slices of 3 mm through the heart for calcium scoring. Calcium scoring was performed using the Agatston method. A 120 kV prospective, gated, contrast cardiac scan was obtained. Gantry rotation speed was 250 msecs and collimation was 0.6 mm. Two sublingual nitroglycerin tablets (0.8 mg) were given. The 3D data set was reconstructed in 5% intervals of the 35-75% of the R-R cycle. Diastolic phases were analyzed on a dedicated workstation using MPR, MIP, and VRT modes. The patient received 95 cc of contrast.  FINDINGS: Image quality: Excellent.  Noise artifact is: Limited.  Coronary Arteries:  Normal coronary origin.  Right dominance.  Left main: The left main is a large caliber vessel with a normal take off from the left coronary cusp that bifurcates to form a left anterior descending artery and a left circumflex artery. There is no plaque or stenosis.  Left anterior descending artery: The LAD gives off 3 patent diagonal branches. There is minimal calcified plaque in the mid LAD with associated stenosis of < 25%.  Left circumflex artery: The LCX is non-dominant and patent with no evidence of plaque or stenosis. The LCX gives off 3 patent obtuse marginal branches.  Right coronary artery: The RCA is dominant with normal take off from the right coronary cusp. There is no evidence of plaque or stenosis. The RCA terminates as a PDA and right posterolateral branch without evidence of plaque or stenosis.  Right Atrium: Right atrial size is within normal limits.  Right Ventricle: The right ventricular cavity is within normal limits.  Left Atrium: Left atrial size is normal in size with no left atrial appendage filling defect.  Left Ventricle: The ventricular cavity size is within normal limits.  Pulmonary arteries: Normal in size.  Pulmonary veins: Normal pulmonary venous drainage.  Pericardium: Normal  thickness without significant effusion or calcium present.  Cardiac valves: The aortic valve is trileaflet without significant calcification. The mitral valve is normal without significant calcification.  Aorta: Normal caliber without significant disease.  Extra-cardiac findings: See attached radiology report for non-cardiac structures.  IMPRESSION: 1. Coronary calcium score of 2.75. This was 55th percentile for age-, sex, and race-matched controls.  2. Total plaque volume 51 mm3 which is 32nd percentile for age- and sex-matched controls (calcified plaque 35mm3; non-calcified plaque 30mm3). TPV is mild.  3. Normal coronary origin with right dominance.  4. Minimal atherosclerosis.  <25% mid LAD.  5. Recommend preventive therapy and risk factor modification.  6. Consider non atherosclerotic causes of chest pain.  RECOMMENDATIONS: 1. CAD-RADS 0: No evidence of CAD (0%). Consider non-atherosclerotic causes of chest pain.  2. CAD-RADS 1: Minimal non-obstructive CAD (0-24%). Consider non-atherosclerotic causes of chest pain. Consider preventive therapy and risk factor modification.  3. CAD-RADS 2: Mild non-obstructive CAD (25-49%). Consider non-atherosclerotic causes of chest pain. Consider preventive therapy and risk factor modification.  4. CAD-RADS 3: Moderate stenosis. Consider symptom-guided anti-ischemic pharmacotherapy as well as risk factor modification per guideline directed care. Additional analysis with CT FFR will be submitted.  5. CAD-RADS 4: Severe stenosis. (70-99% or > 50% left main). Cardiac catheterization or CT FFR is recommended. Consider symptom-guided anti-ischemic pharmacotherapy as well as risk factor modification per guideline directed care. Invasive coronary angiography recommended with revascularization per published guideline statements.  6. CAD-RADS 5: Total coronary occlusion (100%). Consider cardiac catheterization or viability assessment.  Consider symptom-guided anti-ischemic pharmacotherapy as well as risk factor modification per guideline directed  care.  7. CAD-RADS N: Non-diagnostic study. Obstructive CAD can't be excluded. Alternative evaluation is recommended.  Armanda Magic, MD  Electronically Signed: By: Armanda Magic M.D. On: 10/18/2023 16:28     ______________________________________________________________________________________________      Risk Assessment/Calculations:     HYPERTENSION CONTROL Vitals:   11/14/23 1327 11/14/23 1853  BP: (!) 140/80 (!) 140/80    The patient's blood pressure is elevated above target today.  In order to address the patient's elevated BP: Blood pressure will be monitored at home to determine if medication changes need to be made.          Physical Exam:   VS:  BP (!) 140/80   Pulse 78   Ht 6\' 1"  (1.854 m)   Wt (!) 346 lb (156.9 kg)   SpO2 91%   BMI 45.65 kg/m    Wt Readings from Last 3 Encounters:  11/14/23 (!) 346 lb (156.9 kg)  10/03/23 (!) 346 lb 6.4 oz (157.1 kg)  10/01/23 (!) 330 lb (149.7 kg)    GEN: Well nourished, well developed in no acute distress NECK: No JVD; No carotid bruits CARDIAC: RRR, no murmurs, rubs, gallops RESPIRATORY:  Clear to auscultation without rales, wheezing or rhonchi  ABDOMEN: Soft, non-tender, non-distended EXTREMITIES:  No edema; No deformity   ASSESSMENT AND PLAN: .   CAD -nonobstructive per coronary CTA in March 2025.  He continues to have episodes of chest pain that he can pinpoint to 1 finger, symptoms are not consistent with angina.  No indication for further ischemic evaluation at this time.  Continue aspirin 81 mg daily.  Continue fenofibrate 160 mg daily, continue Vascepa 2 g daily, continue metoprolol 25 mg daily, continue Crestor 10 mg daily.  Hypertension-blood pressure slightly elevated today however he has been out of his blood pressure medication, continue Lotrel 10-40 mg daily, continue metoprolol 25 mg  daily.  Dyslipidemia-this appears to be monitored by his PCP, he feels like he just recently had this completed, did discuss that now that we know he has some mild coronary artery disease his previously well-managed cholesterol may need some adjustments.    DOE-will arrange for an echocardiogram for any contributory causes.    Dispo: Echocardiogram.  Request labs from PCP.  Follow-up in 6 months.  Signed, Flossie Dibble, NP

## 2023-11-14 ENCOUNTER — Encounter: Payer: Self-pay | Admitting: Cardiology

## 2023-11-14 ENCOUNTER — Ambulatory Visit: Payer: PPO | Attending: Cardiology | Admitting: Cardiology

## 2023-11-14 VITALS — BP 140/80 | HR 78 | Ht 73.0 in | Wt 346.0 lb

## 2023-11-14 DIAGNOSIS — I2510 Atherosclerotic heart disease of native coronary artery without angina pectoris: Secondary | ICD-10-CM | POA: Diagnosis not present

## 2023-11-14 DIAGNOSIS — E782 Mixed hyperlipidemia: Secondary | ICD-10-CM | POA: Diagnosis not present

## 2023-11-14 DIAGNOSIS — R0609 Other forms of dyspnea: Secondary | ICD-10-CM | POA: Diagnosis not present

## 2023-11-14 DIAGNOSIS — I10 Essential (primary) hypertension: Secondary | ICD-10-CM | POA: Diagnosis not present

## 2023-11-14 NOTE — Patient Instructions (Signed)
 Medication Instructions:  Your physician recommends that you continue on your current medications as directed. Please refer to the Current Medication list given to you today.   *If you need a refill on your cardiac medications before your next appointment, please call your pharmacy*  Lab Work: None If you have labs (blood work) drawn today and your tests are completely normal, you will receive your results only by: MyChart Message (if you have MyChart) OR A paper copy in the mail If you have any lab test that is abnormal or we need to change your treatment, we will call you to review the results.  Testing/Procedures: Your physician has requested that you have an echocardiogram. Echocardiography is a painless test that uses sound waves to create images of your heart. It provides your doctor with information about the size and shape of your heart and how well your heart's chambers and valves are working. This procedure takes approximately one hour. There are no restrictions for this procedure. Please do NOT wear cologne, perfume, aftershave, or lotions (deodorant is allowed). Please arrive 15 minutes prior to your appointment time.  Please note: We ask at that you not bring children with you during ultrasound (echo/ vascular) testing. Due to room size and safety concerns, children are not allowed in the ultrasound rooms during exams. Our front office staff cannot provide observation of children in our lobby area while testing is being conducted. An adult accompanying a patient to their appointment will only be allowed in the ultrasound room at the discretion of the ultrasound technician under special circumstances. We apologize for any inconvenience.   Follow-Up: At [facility], you and your health needs are our priority.  As part of our continuing mission to provide you with exceptional heart care, our providers are all part of one team.  This team includes your primary Cardiologist  (physician) and Advanced Practice Providers or APPs (Physician Assistants and Nurse Practitioners) who all work together to provide you with the care you need, when you need it.  Your next appointment:   6 month(s)  Provider:   Wallis Bamberg, NP Rosalita Levan)    We recommend signing up for the patient portal called "MyChart".  Sign up information is provided on this After Visit Summary.  MyChart is used to connect with patients for Virtual Visits (Telemedicine).  Patients are able to view lab/test results, encounter notes, upcoming appointments, etc.  Non-urgent messages can be sent to your provider as well.   To learn more about what you can do with MyChart, go to [email or web address].   Other Instructions None

## 2023-11-16 ENCOUNTER — Telehealth: Payer: Self-pay | Admitting: Cardiology

## 2023-11-17 MED ORDER — AMLODIPINE BESY-BENAZEPRIL HCL 10-40 MG PO CAPS: 1.0000 | ORAL_CAPSULE | Freq: Every day | ORAL | 1 refills | Status: AC

## 2023-11-17 NOTE — Telephone Encounter (Signed)
 Called and spoke to patient and reviewed [name] recommendations. Per patient he is going to try and lose weight before increasing his crestor. Patient did not have any questions and verbalize understanding.

## 2023-11-20 DIAGNOSIS — E876 Hypokalemia: Secondary | ICD-10-CM | POA: Diagnosis not present

## 2023-11-20 DIAGNOSIS — R252 Cramp and spasm: Secondary | ICD-10-CM | POA: Diagnosis not present

## 2023-11-20 DIAGNOSIS — Z6841 Body Mass Index (BMI) 40.0 and over, adult: Secondary | ICD-10-CM | POA: Diagnosis not present

## 2023-11-20 LAB — LAB REPORT - SCANNED

## 2023-11-23 ENCOUNTER — Other Ambulatory Visit (HOSPITAL_COMMUNITY): Payer: Self-pay

## 2023-11-24 ENCOUNTER — Other Ambulatory Visit (HOSPITAL_COMMUNITY): Payer: Self-pay

## 2023-11-24 DIAGNOSIS — R002 Palpitations: Secondary | ICD-10-CM | POA: Diagnosis not present

## 2023-11-27 ENCOUNTER — Other Ambulatory Visit (HOSPITAL_COMMUNITY): Payer: Self-pay

## 2023-11-27 MED ORDER — OXYCODONE-ACETAMINOPHEN 10-325 MG PO TABS
1.0000 | ORAL_TABLET | ORAL | 0 refills | Status: DC | PRN
Start: 2023-11-27 — End: 2024-02-06
  Filled 2023-11-27: qty 180, 30d supply, fill #0

## 2023-12-08 ENCOUNTER — Ambulatory Visit: Admitting: Gastroenterology

## 2023-12-08 ENCOUNTER — Telehealth: Payer: Self-pay | Admitting: Cardiology

## 2023-12-08 ENCOUNTER — Other Ambulatory Visit (HOSPITAL_COMMUNITY): Payer: Self-pay

## 2023-12-08 NOTE — Telephone Encounter (Signed)
 Called and spoke to patient. He reports that Dr. Meredeth Stallion ordered and started the monitor due to the patient having chest pain. They applied the monitor  while he was waiting to see cardiology. He had no further questions.   Pattricia Bores made aware.

## 2023-12-08 NOTE — Telephone Encounter (Signed)
 We received a copy of the recent monitor that was arranged by Dr. Starlin Echevaria.  He has some extra beats that originate from the bottom part of his heart, he is going to have an echocardiogram so that is good.  My question is why Dr. Starlin Echevaria arrange a monitor, what were his symptoms?

## 2023-12-12 ENCOUNTER — Ambulatory Visit: Admitting: Gastroenterology

## 2023-12-12 ENCOUNTER — Ambulatory Visit

## 2023-12-12 ENCOUNTER — Other Ambulatory Visit (HOSPITAL_COMMUNITY): Payer: Self-pay

## 2023-12-14 ENCOUNTER — Other Ambulatory Visit (HOSPITAL_COMMUNITY): Payer: Self-pay

## 2023-12-15 ENCOUNTER — Other Ambulatory Visit (HOSPITAL_COMMUNITY): Payer: Self-pay

## 2023-12-15 ENCOUNTER — Encounter: Payer: Self-pay | Admitting: Gastroenterology

## 2023-12-15 ENCOUNTER — Ambulatory Visit: Admitting: Gastroenterology

## 2023-12-15 VITALS — BP 120/80 | HR 72 | Ht 71.0 in | Wt 350.2 lb

## 2023-12-15 DIAGNOSIS — K219 Gastro-esophageal reflux disease without esophagitis: Secondary | ICD-10-CM

## 2023-12-15 DIAGNOSIS — R1310 Dysphagia, unspecified: Secondary | ICD-10-CM | POA: Diagnosis not present

## 2023-12-15 DIAGNOSIS — K2100 Gastro-esophageal reflux disease with esophagitis, without bleeding: Secondary | ICD-10-CM

## 2023-12-15 DIAGNOSIS — I480 Paroxysmal atrial fibrillation: Secondary | ICD-10-CM

## 2023-12-15 DIAGNOSIS — Z860100 Personal history of colon polyps, unspecified: Secondary | ICD-10-CM

## 2023-12-15 DIAGNOSIS — Z860101 Personal history of adenomatous and serrated colon polyps: Secondary | ICD-10-CM | POA: Diagnosis not present

## 2023-12-15 DIAGNOSIS — T402X5A Adverse effect of other opioids, initial encounter: Secondary | ICD-10-CM | POA: Diagnosis not present

## 2023-12-15 DIAGNOSIS — K5903 Drug induced constipation: Secondary | ICD-10-CM

## 2023-12-15 MED ORDER — LINACLOTIDE 290 MCG PO CAPS: 290.0000 ug | ORAL_CAPSULE | Freq: Every day | ORAL | 3 refills | Status: DC

## 2023-12-15 MED ORDER — NA SULFATE-K SULFATE-MG SULF 17.5-3.13-1.6 GM/177ML PO SOLN: ORAL | 0 refills | Status: DC

## 2023-12-15 NOTE — Progress Notes (Signed)
 Chief Complaint: Dysphagia Primary GI MD: Dr. Karene Oto  HPI: 53 year old male history of A-fib (not on anticoagulation), hypertension, sleep apnea, chronic pain syndrome on Percocet, presents for evaluation of dysphagia  Seen at outside facility emergency department for dysphagia 10/05/2023.  We do not have these records available. Patient reports he wasn't even seen in ED  Discussed the use of AI scribe software for clinical note transcription with the patient, who gave verbal consent to proceed.  History of Present Illness He has experienced difficulty swallowing for approximately a year, initially triggered by cornbread and now affecting other foods like steak. The episodes have become more frequent, although he has managed better recently by taking smaller bites. He describes the sensation as food 'hanging' in his throat, particularly with the first few swallows, and has had severe episodes where he was unable to swallow liquids or saliva, leading to regurgitation and a visit to the emergency room in February. He is currently taking omeprazole  once daily for heartburn, which helps with heartburn but not significantly with swallowing difficulties.  He has severe constipation, which he attributes to his daily use of Percocet for pain management. He is currently taking Linzess  at the lowest dose, which he finds unpredictable and sometimes results in 'explosive' diarrhea. He has a history of multiple surgeries, including eight back surgeries, which may contribute to his gastrointestinal symptoms.  In terms of family history, his father had a hiatal hernia, which required treatment. He also mentions a history of atrial fibrillation and is currently taking aspirin  daily. He has experienced a heart event recently, which is under investigation with an upcoming echocardiogram scheduled in two weeks.  No drooling but reports regurgitation and inability to swallow liquids during severe episodes. He  also reports heartburn, weight gain, and severe constipation.     PREVIOUS GI WORKUP   EGD 06/13/2019 for heartburn - LA Grade B reflux esophagitis with no bleeding.  - Normal upper third of esophagus and middle third of esophagus. - Gastritis. Biopsied.  - Erythematous duodenopathy. Biopsied.  - Normal second portion of the duodenum. - Repeat 6 to 8 weeks  Colonoscopy 06/2019 LLQ pain and constipation - Preparation of the colon was fair.  - Two 6 to 8 mm polyps in the rectum and in the sigmoid colon, removed with a cold snare. Resected and retrieved.  - Non- bleeding internal hemorrhoids. - Stool in ascending colon and cecum - Repeat 2 years due to suboptimal prep  Diagnosis 1. 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 Surgical [P], gastric antrum and gastric body - GASTRIC ANTRAL AND OXYNTIC MUCOSA WITH MILD NONSPECIFIC REACTIVE GASTROPATHY - WARTHIN STARRY STAIN IS NEGATIVE FOR HELICOBACTER PYLORI 3. Surgical [P], colon, sigmoid and rectum, polyp (2) - TUBULAR ADENOMA(S) - NEGATIVE FOR HIGH-GRADE DYSPLASIA OR MALIGNANCY  Past Medical History:  Diagnosis Date   Abdominal pain, left lower quadrant 06/21/2013   Anxiety    Arthritis    back area   Blood dyscrasia    thrombocytopenia  15 months   CAD (coronary artery disease) 2025   mild non-obstructive per cCTA   Chest pain in adult 03/24/2017   Chronic back pain    Complication of anesthesia    noticed some memory loss after surgery 07/18/12   Depression    on prozac    Dysrhythmia    PAF in 2012, felt related to OSA--now on CPAP; PRN follow-up Dr. Orlan Bis 03/2011  Essential hypertension    Dr. Arnold Bicker, [facility]     GERD (gastroesophageal reflux disease)    uses baking soda   Hyperlipidemia    Hypertension    Dr. 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   PAF (paroxysmal atrial fibrillation) (HCC) 03/24/2017   In 2012 related to sleep apnea     Pain of left hip joint 07/19/2022   Pancreatitis    hx of   Pneumonia    hx of  2005ish   Pseudoarthrosis of lumbar spine 10/02/2019   Sleep apnea    sleep study approx 18 months ago, uses cpap   Status post cervical spinal fusion 10/02/2019   Thrombocytopenia, idiopathic (HCC)    age of 53 months    Past Surgical History:  Procedure Laterality Date   APPENDECTOMY     71 months of age and removed scar tissue   BACK SURGERY     x2 fusions, L5-S1, L4-L5, a total of 6 per patient    CHOLECYSTECTOMY     DIAGNOSTIC LAPAROSCOPY     exploratory lab, abdominal   INGUINAL HERNIA REPAIR Left    inguinal   REFRACTIVE SURGERY Bilateral 1998   SACROILIAC JOINT FUSION Right 11/21/2013   Procedure: RIGHT SACROILIAC JOINT FUSION;  Surgeon: Estevan Helper, MD;  Location: [facility] OR;  Service: Orthopedics;  Laterality: Right;  Right sided sacroiliac joint fusion   SACROILIAC JOINT FUSION Left 01/22/2014   Procedure: SACROILIAC JOINT FUSION;  Surgeon: Estevan Helper, MD;  Location: [facility] OR;  Service: Orthopedics;  Laterality: Left;  Left sided sacroiliac joint fusion   SPINAL CORD STIMULATOR IMPLANT  07/18/2012   SPINAL CORD STIMULATOR INSERTION  07/18/2012   Procedure: LUMBAR SPINAL CORD STIMULATOR INSERTION;  Surgeon: Mort Ards, MD;  Location: [facility] OR;  Service: Orthopedics;  Laterality: N/A;  trial spinal cord stimulator implant    SPINAL CORD STIMULATOR REMOVAL  07/26/2012   Procedure: LUMBAR SPINAL CORD STIMULATOR REMOVAL;  Surgeon: Mort Ards, MD;  Location: [facility] OR;  Service: Orthopedics;  Laterality: N/A;  REMOVAL OF TRIAL LEAD OR CONVERSION TO PERM SPINAL CORD STIMULATOR IMPLANT   TONSILLECTOMY AND ADENOIDECTOMY     TYMPANOSTOMY TUBE PLACEMENT Bilateral    x 3   VASECTOMY      Current Outpatient Medications  Medication Sig Dispense Refill    acetaminophen  (TYLENOL ) 500 MG tablet Take 1,000 mg by mouth every 4 (four) hours as needed for mild pain (pain score 1-3) or moderate pain (pain score 4-6).     amLODipine -benazepril  (LOTREL) 10-40 MG capsule Take 1 capsule by mouth at bedtime. 90 capsule 1   aspirin  EC 81 MG tablet Take 1 tablet (81 mg total) by mouth daily. Swallow whole. 90 tablet 3   Azelastine HCl 137 MCG/SPRAY SOLN Place 1 spray into the nose 2 (two) times daily as needed.     cyanocobalamin  (VITAMIN B12) 1000 MCG/ML injection Inject 100 mcg into the muscle every 30 (thirty) days.     diclofenac  (VOLTAREN ) 75 MG EC tablet Take 75 mg by mouth 2 (two) times daily with a meal.     fenofibrate  160 MG tablet Take 160 mg by mouth daily.  1   FLUoxetine  (PROZAC ) 10 MG capsule Take 1 capsule (10 mg total) by mouth daily. 30 capsule 1   Icosapent  Ethyl (VASCEPA ) 1 g CAPS Take 2 capsules by mouth 2 (two) times daily.  linaclotide  (LINZESS ) 290 MCG CAPS capsule Take 1 capsule (290 mcg total) by mouth daily before breakfast. 30 capsule 3   methocarbamol  (ROBAXIN ) 750 MG tablet Take 2 tablets (1,500 mg total) by mouth 3 (three) times daily as needed. 180 tablet 1   metoprolol  succinate (TOPROL  XL) 25 MG 24 hr tablet Take 1 tablet (25 mg total) by mouth daily. 90 tablet 3   Multiple Vitamin (MULTI-VITAMIN) tablet Take 1 tablet by mouth daily.     Na Sulfate-K Sulfate-Mg Sulfate concentrate (SUPREP) 17.5-3.13-1.6 GM/177ML SOLN Use as directed; may use generic; goodrx card if insurance will not cover generic 354 mL 0   naloxegol oxalate (MOVANTIK) 25 MG TABS tablet Take 12.5 mg by mouth daily as needed.     nitroGLYCERIN  (NITROSTAT ) 0.4 MG SL tablet Place 1 tablet (0.4 mg total) under the tongue every 5 (five) minutes as needed for chest pain. 25 tablet 1   oxyCODONE -acetaminophen  (PERCOCET) 10-325 MG tablet Take 1 tablet by mouth every 4 (four) hours as needed for pain 180 tablet 0   oxymorphone (OPANA ER) 10 MG 12 hr tablet Take 10  mg by mouth every 12 (twelve) hours.     rosuvastatin  (CRESTOR ) 10 MG tablet Take 1 tablet (10 mg total) by mouth daily. 90 tablet 3   Current Facility-Administered Medications  Medication Dose Route Frequency Provider Last Rate Last Admin   triamcinolone  acetonide (KENALOG ) 10 MG/ML injection 10 mg  10 mg Other Once Stover, Titorya, DPM       triamcinolone  acetonide (KENALOG ) 10 MG/ML injection 10 mg  10 mg Other Once Stover, Titorya, DPM       triamcinolone  acetonide (KENALOG ) 10 MG/ML injection 10 mg  10 mg Other Once Stover, Titorya, DPM        Allergies as of 12/15/2023 - Review Complete 12/15/2023  Allergen Reaction Noted   Nexium [esomeprazole magnesium] Hives and Swelling 07/12/2012   Wellbutrin [bupropion] Hives and Swelling 07/12/2012    Family History  Problem Relation Age of Onset   Heart attack Mother    Colon polyps Mother    Diabetes Mother    Hypertension Mother    Hyperlipidemia Mother    Cancer Father        type unknown   Diabetes Father    Hypertension Father    Hyperlipidemia Father    Hypertension Sister    Hyperlipidemia Sister    Other Sister        brain tumor   Hypertension Sister    Hyperlipidemia Sister    Heart attack Brother    Hypertension Brother    Hyperlipidemia Brother    Diabetes Maternal Grandmother    Heart disease Maternal Grandmother    Diabetes Maternal Grandfather    Diabetes Paternal Grandmother    Diabetes Paternal Grandfather    Colon cancer Neg Hx    Esophageal cancer Neg Hx     Social History   Socioeconomic History   Marital status: Legally Separated    Spouse name: Not on file   Number of children: 3   Years of education: Not on file   Highest education level: Not on file  Occupational History   Occupation: disabled Paediatric nurse  Tobacco Use   Smoking status: Former    Current packs/day: 1.00    Average packs/day: 1 pack/day for 18.0 years (18.0 ttl pk-yrs)    Types: Cigarettes   Smokeless tobacco: Never  Vaping  Use   Vaping status: Never Used  Substance and Sexual Activity  Alcohol use: No   Drug use: No   Sexual activity: Yes  Other Topics Concern   Not on file  Social History Narrative   Not on file   Social Drivers of Health   Financial Resource Strain: Not on file  Food Insecurity: Not on file  Transportation Needs: Not on file  Physical Activity: Not on file  Stress: Not on file  Social Connections: Not on file  Intimate Partner Violence: Not on file    Review of Systems:    Constitutional: No weight loss, fever, chills, weakness or fatigue HEENT: Eyes: No change in vision               Ears, Nose, Throat:  No change in hearing or congestion Skin: No rash or itching Cardiovascular: No chest pain, chest pressure or palpitations   Respiratory: No SOB or cough Gastrointestinal: See HPI and otherwise negative Genitourinary: No dysuria or change in urinary frequency Neurological: No headache, dizziness or syncope Musculoskeletal: No new muscle or joint pain Hematologic: No bleeding or bruising Psychiatric: No history of depression or anxiety    Physical Exam:  Vital signs: BP 120/80 (BP Location: Left Arm, Patient Position: Sitting, Cuff Size: Large)   Pulse 72   Ht 5\' 11"  (1.803 m)   Wt (!) 350 lb 4 oz (158.9 kg)   BMI 48.85 kg/m   Constitutional: NAD, alert and cooperative.  Obese Head:  Normocephalic and atraumatic. Eyes:   PEERL, EOMI. No icterus. Conjunctiva pink. Respiratory: Respirations even and unlabored. Lungs clear to auscultation bilaterally.   No wheezes, crackles, or rhonchi.  Cardiovascular:  Regular rate and rhythm. No peripheral edema, cyanosis or pallor.  Gastrointestinal:  Soft, nondistended, nontender. No rebound or guarding.  Hypoactive bowel sounds. No appreciable masses or hepatomegaly. Rectal:  Declines Msk:  Symmetrical without gross deformities. Without edema, no deformity or joint abnormality.  Neurologic:  Alert and  oriented x4;  grossly  normal neurologically.  Skin:   Dry and intact without significant lesions or rashes. Psychiatric: Oriented to person, place and time. Demonstrates good judgement and reason without abnormal affect or behaviors.   RELEVANT LABS AND IMAGING: CBC    Component Value Date/Time   WBC 10.7 (H) 10/01/2023 0935   RBC 5.19 10/01/2023 0935   HGB 15.9 10/01/2023 0935   HCT 44.7 10/01/2023 0935   PLT 273 10/01/2023 0935   MCV 86.1 10/01/2023 0935   MCH 30.6 10/01/2023 0935   MCHC 35.6 10/01/2023 0935   RDW 11.8 10/01/2023 0935   LYMPHSABS 3.9 09/30/2019 1338   MONOABS 0.8 09/30/2019 1338   EOSABS 0.3 09/30/2019 1338   BASOSABS 0.1 09/30/2019 1338    CMP     Component Value Date/Time   NA 142 10/03/2023 1130   K 3.6 10/03/2023 1130   CL 104 10/03/2023 1130   CO2 21 10/03/2023 1130   GLUCOSE 114 (H) 10/03/2023 1130   GLUCOSE 109 (H) 10/01/2023 0935   BUN 6 10/03/2023 1130   CREATININE 0.56 (L) 10/03/2023 1130   CALCIUM  9.6 10/03/2023 1130   PROT 7.2 09/30/2019 1338   ALBUMIN  4.2 09/30/2019 1338   AST 20 09/30/2019 1338   ALT 17 09/30/2019 1338   ALKPHOS 80 09/30/2019 1338   BILITOT 0.6 09/30/2019 1338   GFRNONAA >60 10/01/2023 0935   GFRAA >60 09/30/2019 1338     Assessment/Plan:   History of colon polyps Colonoscopy 06/2019 with 2 tubular adenomas and suboptimal prep with a recall of 2 years (2022) -  Schedule colonoscopy with 2-day prep - I thoroughly discussed the procedure with the patient (at bedside) to include nature of the procedure, alternatives, benefits, and risks (including but not limited to bleeding, infection, perforation, anesthesia/cardiac pulmonary complications).  Patient verbalized understanding and gave verbal consent to proceed with procedure.   Chronic drug-induced constipation History of chronic pain syndrome on chronic opioids (Percocet).  Currently not well-controlled on Linzess  72 mcg.  Previous adverse side effect to Movantik. - Increase Linzess  to  290 mcg - Increase water, increase fiber, increase exercise  Dysphagia GERD with esophagitis EGD in 2020 with esophagitis and recommendation to repeat 6 to 8 weeks to check for healing which was not completed.  Dysphagia ongoing for the last year worsening over the last few months.  Dysphagia to solid foods now improving on omeprazole  40mg  once daily and chewing modifications.  GERD improved on omeprazole .  Scheduled for echocardiogram 12/27/2023.  Will schedule procedures for after his echocardiogram - Made alert to check echocardiogram results - Scheduled for EGD with possible dilation in July - Increase omeprazole  to twice daily - Educated patient on lifestyle modifications and provided patient education handouts. - Continue to masticate adequately - If another episode of food impaction occurs in which patient cannot tolerate secretions he is advised to proceed to ED for further evaluation  Afib Not on anticoagulation.  Reports previous cardiac event months ago and scheduled for echocardiogram 12/27/2023.  Workup thus far has been negative. - Alert made to check echocardiogram results 12/27/2023.  If abnormal will change procedure to hospital  Suzanna Erp, [facility] 12/15/2023, 11:30 AM  Cc: Beecher Bower, MD

## 2023-12-15 NOTE — Patient Instructions (Addendum)
 You have been scheduled for an endoscopy and colonoscopy. Please follow the written instructions given to you at your visit today.  If you use inhalers (even only as needed), please bring them with you on the day of your procedure.  DO NOT TAKE 7 DAYS PRIOR TO TEST- Trulicity (dulaglutide) Ozempic , Wegovy  (semaglutide ) Mounjaro (tirzepatide) Bydureon Bcise (exanatide extended release)  DO NOT TAKE 1 DAY PRIOR TO YOUR TEST Rybelsus  (semaglutide ) Adlyxin (lixisenatide) Victoza (liraglutide) Byetta (exanatide) ___________________________________________________________________________  We have sent the following medications to your pharmacy for you to pick up at your convenience:  Suprep, Linzess   _______________________________________________________  If your blood pressure at your visit was 140/90 or greater, please contact your primary care physician to follow up on this.  _______________________________________________________  If you are age 53 or older, your body mass index should be between 23-30. Your Body mass index is 48.85 kg/m. If this is out of the aforementioned range listed, please consider follow up with your Primary Care Provider.  If you are age 53 or younger, your body mass index should be between 19-25. Your Body mass index is 48.85 kg/m. If this is out of the aformentioned range listed, please consider follow up with your Primary Care Provider.   ________________________________________________________  The [facility] providers would like to encourage you to use MYCHART to communicate with providers for non-urgent requests or questions.  Due to long hold times on the telephone, sending your provider a message by [facility] may be a faster and more efficient way to get a response.  Please allow 48 business hours for a response.  Please remember that this is for non-urgent requests.  _______________________________________________________  Due to recent changes in  healthcare laws, you may see the results of your imaging and laboratory studies on MyChart before your provider has had a chance to review them.  We understand that in some cases there may be results that are confusing or concerning to you. Not all laboratory results come back in the same time frame and the provider may be waiting for multiple results in order to interpret others.  Please give us  48 hours in order for your provider to thoroughly review all the results before contacting the office for clarification of your results.   Thank you for entrusting me with your care and choosing [facility].  Bayley Luan Rumpf, PA-C

## 2023-12-17 NOTE — Progress Notes (Signed)
 Agree with the assessment and plan as outlined by Boone Master, PA-C.  Nimrat Woolworth, DO, [facility]

## 2023-12-20 ENCOUNTER — Other Ambulatory Visit (HOSPITAL_COMMUNITY): Payer: Self-pay

## 2023-12-20 DIAGNOSIS — Z79891 Long term (current) use of opiate analgesic: Secondary | ICD-10-CM | POA: Diagnosis not present

## 2023-12-20 DIAGNOSIS — M4608 Spinal enthesopathy, sacral and sacrococcygeal region: Secondary | ICD-10-CM | POA: Diagnosis not present

## 2023-12-20 DIAGNOSIS — G894 Chronic pain syndrome: Secondary | ICD-10-CM | POA: Diagnosis not present

## 2023-12-20 DIAGNOSIS — M961 Postlaminectomy syndrome, not elsewhere classified: Secondary | ICD-10-CM | POA: Diagnosis not present

## 2023-12-20 MED ORDER — OXYCODONE-ACETAMINOPHEN 10-325 MG PO TABS
1.0000 | ORAL_TABLET | ORAL | 0 refills | Status: DC | PRN
  Filled 2023-12-25: qty 180, 30d supply, fill #0

## 2023-12-20 MED ORDER — FLUOXETINE HCL 10 MG PO CAPS
10.0000 mg | ORAL_CAPSULE | Freq: Every day | ORAL | 1 refills | Status: DC
  Filled 2023-12-20 – 2023-12-25 (×2): qty 30, 30d supply, fill #0

## 2023-12-20 MED ORDER — METHOCARBAMOL 750 MG PO TABS
1500.0000 mg | ORAL_TABLET | Freq: Three times a day (TID) | ORAL | 1 refills | Status: DC | PRN
  Filled 2023-12-20 – 2023-12-25 (×2): qty 180, 30d supply, fill #0
  Filled 2024-01-22: qty 180, 30d supply, fill #1

## 2023-12-20 NOTE — Progress Notes (Signed)
 Agree with the assessment and plan as outlined by Boone Master, PA-C.  Nimrat Woolworth, DO, [facility]

## 2023-12-25 ENCOUNTER — Other Ambulatory Visit (HOSPITAL_COMMUNITY): Payer: Self-pay

## 2023-12-25 ENCOUNTER — Other Ambulatory Visit: Payer: Self-pay

## 2023-12-25 DIAGNOSIS — E538 Deficiency of other specified B group vitamins: Secondary | ICD-10-CM | POA: Diagnosis not present

## 2023-12-26 DIAGNOSIS — J329 Chronic sinusitis, unspecified: Secondary | ICD-10-CM | POA: Diagnosis not present

## 2023-12-26 DIAGNOSIS — Z6841 Body Mass Index (BMI) 40.0 and over, adult: Secondary | ICD-10-CM | POA: Diagnosis not present

## 2023-12-27 ENCOUNTER — Ambulatory Visit: Attending: Cardiology

## 2023-12-27 DIAGNOSIS — I2510 Atherosclerotic heart disease of native coronary artery without angina pectoris: Secondary | ICD-10-CM

## 2023-12-27 DIAGNOSIS — R0609 Other forms of dyspnea: Secondary | ICD-10-CM | POA: Diagnosis not present

## 2023-12-27 LAB — ECHOCARDIOGRAM COMPLETE
Area-P 1/2: 3.85 cm2
S' Lateral: 3.9 cm

## 2023-12-28 ENCOUNTER — Ambulatory Visit: Payer: Self-pay | Admitting: Cardiology

## 2023-12-29 ENCOUNTER — Telehealth: Payer: Self-pay | Admitting: Gastroenterology

## 2023-12-29 NOTE — Telephone Encounter (Signed)
 ECHO 5/21 with adequate EF. He can proceed with procedure in [facility]

## 2024-01-03 ENCOUNTER — Telehealth: Payer: Self-pay | Admitting: Gastroenterology

## 2024-01-03 NOTE — Telephone Encounter (Signed)
 Patient called and stated that he was prescribed Linzess . Patient stated that since taking this medication he has been feeling like he is dizzy and going to pass out. Patient is requesting to speak to the nurse regarding this medication. Please advise.

## 2024-01-04 NOTE — Telephone Encounter (Signed)
 Left message for patient to call back

## 2024-01-05 ENCOUNTER — Other Ambulatory Visit (HOSPITAL_COMMUNITY): Payer: Self-pay

## 2024-01-05 MED ORDER — NA SULFATE-K SULFATE-MG SULF 17.5-3.13-1.6 GM/177ML PO SOLN
ORAL | 0 refills | Status: DC
  Filled 2024-01-05 – 2024-01-25 (×2): qty 354, 1d supply, fill #0

## 2024-01-05 NOTE — Telephone Encounter (Signed)
 Patient calls indicating that he has been having episodes following taking linzess  290 mcg where he has headache and "feels like something is going to burst inside." Patient says that he had this same reaction with Movantik though it was much worse with Movantik than the linzess . Patient also c/o abdominal cramping with linzess  290 mcg. He says he has been on the 290 mcg dosage for about 2 weeks and feels that his bowels go back and forth. Sometimes has 4-5 bm daily, other days has only 1-2 bowel movements. Patient would like to know if you would recommend he continue with the medication or try something different but also says he "doesn't want to run out of options."  Please advise.David AasAaron Meyers

## 2024-01-05 NOTE — Telephone Encounter (Signed)
 Left message for patient to call back

## 2024-01-05 NOTE — Telephone Encounter (Signed)
 Patient is calling back requesting to speak to [employer]. Patient is requesting a call back. Please advise.

## 2024-01-08 DIAGNOSIS — J0120 Acute ethmoidal sinusitis, unspecified: Secondary | ICD-10-CM | POA: Diagnosis not present

## 2024-01-08 DIAGNOSIS — Z6841 Body Mass Index (BMI) 40.0 and over, adult: Secondary | ICD-10-CM | POA: Diagnosis not present

## 2024-01-08 MED ORDER — LUBIPROSTONE 24 MCG PO CAPS: 24.0000 ug | ORAL_CAPSULE | Freq: Two times a day (BID) | ORAL | 0 refills | Status: DC

## 2024-01-08 NOTE — Telephone Encounter (Signed)
 I have spoken to patient to advise of Bayley's recommendation to discontinue Linzess  and instead try Amitiza 24 mcg twice daily for 30 days. Advised that patient should let us  know how he is doing on the Amitiza and we can make adjustments/changes as needed.

## 2024-01-15 ENCOUNTER — Other Ambulatory Visit (HOSPITAL_COMMUNITY): Payer: Self-pay

## 2024-01-22 ENCOUNTER — Other Ambulatory Visit (HOSPITAL_COMMUNITY): Payer: Self-pay

## 2024-01-25 ENCOUNTER — Other Ambulatory Visit (HOSPITAL_COMMUNITY): Payer: Self-pay

## 2024-01-25 DIAGNOSIS — G894 Chronic pain syndrome: Secondary | ICD-10-CM | POA: Diagnosis not present

## 2024-01-25 DIAGNOSIS — M961 Postlaminectomy syndrome, not elsewhere classified: Secondary | ICD-10-CM | POA: Diagnosis not present

## 2024-01-25 DIAGNOSIS — Z79891 Long term (current) use of opiate analgesic: Secondary | ICD-10-CM | POA: Diagnosis not present

## 2024-01-25 DIAGNOSIS — M4608 Spinal enthesopathy, sacral and sacrococcygeal region: Secondary | ICD-10-CM | POA: Diagnosis not present

## 2024-01-25 MED ORDER — OXYCODONE-ACETAMINOPHEN 10-325 MG PO TABS
1.0000 | ORAL_TABLET | ORAL | 0 refills | Status: DC
  Filled 2024-01-25: qty 180, 30d supply, fill #0

## 2024-01-25 MED ORDER — FLUOXETINE HCL 10 MG PO CAPS
10.0000 mg | ORAL_CAPSULE | Freq: Every day | ORAL | 1 refills | Status: AC
  Filled 2024-01-25: qty 30, 30d supply, fill #0

## 2024-01-27 ENCOUNTER — Other Ambulatory Visit (HOSPITAL_COMMUNITY): Payer: Self-pay

## 2024-01-29 ENCOUNTER — Telehealth: Payer: Self-pay | Admitting: Gastroenterology

## 2024-01-29 ENCOUNTER — Encounter: Payer: Self-pay | Admitting: Gastroenterology

## 2024-01-29 NOTE — Telephone Encounter (Signed)
 Called patient to discuss medications advised hold all NSAIDS to include Meloxicam , Ibuprofen, Aleve, Diclofenac , etc 7 days prior to the colonoscopy patient may take Tylenol .Patient verbalized understanding

## 2024-01-29 NOTE — Telephone Encounter (Signed)
 Patient calling in regards to medications.  Please advise.   Thank you

## 2024-02-06 ENCOUNTER — Ambulatory Visit: Admitting: Gastroenterology

## 2024-02-06 ENCOUNTER — Encounter: Payer: Self-pay | Admitting: Gastroenterology

## 2024-02-06 VITALS — BP 143/82 | HR 73 | Temp 98.0°F | Resp 10 | Ht 71.0 in | Wt 344.6 lb

## 2024-02-06 DIAGNOSIS — F419 Anxiety disorder, unspecified: Secondary | ICD-10-CM | POA: Diagnosis not present

## 2024-02-06 DIAGNOSIS — F32A Depression, unspecified: Secondary | ICD-10-CM | POA: Diagnosis not present

## 2024-02-06 DIAGNOSIS — Z860100 Personal history of colon polyps, unspecified: Secondary | ICD-10-CM

## 2024-02-06 DIAGNOSIS — K648 Other hemorrhoids: Secondary | ICD-10-CM

## 2024-02-06 DIAGNOSIS — K2970 Gastritis, unspecified, without bleeding: Secondary | ICD-10-CM | POA: Diagnosis not present

## 2024-02-06 DIAGNOSIS — D123 Benign neoplasm of transverse colon: Secondary | ICD-10-CM

## 2024-02-06 DIAGNOSIS — Z860101 Personal history of adenomatous and serrated colon polyps: Secondary | ICD-10-CM | POA: Diagnosis not present

## 2024-02-06 DIAGNOSIS — K2100 Gastro-esophageal reflux disease with esophagitis, without bleeding: Secondary | ICD-10-CM | POA: Diagnosis not present

## 2024-02-06 DIAGNOSIS — K319 Disease of stomach and duodenum, unspecified: Secondary | ICD-10-CM

## 2024-02-06 DIAGNOSIS — R1310 Dysphagia, unspecified: Secondary | ICD-10-CM

## 2024-02-06 DIAGNOSIS — Z1211 Encounter for screening for malignant neoplasm of colon: Secondary | ICD-10-CM

## 2024-02-06 DIAGNOSIS — I2510 Atherosclerotic heart disease of native coronary artery without angina pectoris: Secondary | ICD-10-CM | POA: Diagnosis not present

## 2024-02-06 DIAGNOSIS — K562 Volvulus: Secondary | ICD-10-CM

## 2024-02-06 DIAGNOSIS — I10 Essential (primary) hypertension: Secondary | ICD-10-CM | POA: Diagnosis not present

## 2024-02-06 MED ORDER — PANTOPRAZOLE SODIUM 40 MG PO TBEC: 40.0000 mg | DELAYED_RELEASE_TABLET | Freq: Two times a day (BID) | ORAL | 3 refills | Status: AC

## 2024-02-06 MED ORDER — SODIUM CHLORIDE 0.9 % IV SOLN: 500.0000 mL | Freq: Once | INTRAVENOUS | Status: DC

## 2024-02-06 NOTE — Progress Notes (Signed)
 Called to room to assist during endoscopic procedure.  Patient ID and intended procedure confirmed with present staff. Received instructions for my participation in the procedure from the performing physician.

## 2024-02-06 NOTE — Op Note (Signed)
 [facility] Patient Name: [name] Procedure Date: 02/06/2024 10:22 AM MRN: [id number] Endoscopist: Sandor Flatter , MD, [id number] Age: 53 Referring MD:  Date of Birth: Dec 22, 1970 Gender: Male Account #: [id number] Procedure:                Upper GI endoscopy Indications:              Dysphagia, Heartburn, Reflux esophagitis Medicines:                Monitored Anesthesia Care Procedure:                Pre-Anesthesia Assessment:                           - Prior to the procedure, a History and Physical                            was performed, and patient medications and                            allergies were reviewed. The patient's tolerance of                            previous anesthesia was also reviewed. The risks                            and benefits of the procedure and the sedation                            options and risks were discussed with the patient.                            All questions were answered, and informed consent                            was obtained. Prior Anticoagulants: The patient has                            taken no anticoagulant or antiplatelet agents                            except for aspirin . ASA Grade Assessment: III - A                            patient with severe systemic disease. After                            reviewing the risks and benefits, the patient was                            deemed in satisfactory condition to undergo the                            procedure.  After obtaining informed consent, the endoscope was                            passed under direct vision. Throughout the                            procedure, the patient's blood pressure, pulse, and                            oxygen saturations were monitored continuously. The                            Olympus Scope [id number] was introduced through the                            mouth, and advanced to the second part  of duodenum.                            The upper GI endoscopy was accomplished without                            difficulty. The patient tolerated the procedure                            well. Scope In: Scope Out: Findings:                 The upper third of the esophagus and middle third                            of the esophagus were normal.                           LA Grade B (one or more mucosal breaks greater than                            5 mm, not extending between the tops of two mucosal                            folds) esophagitis with no bleeding was found 42 cm                            from the incisors.                           Scattered mild inflammation characterized by                            congestion (edema) and erythema was found in the                            gastric body and in the gastric antrum. Biopsies  were taken with a cold forceps for Helicobacter                            pylori testing. Estimated blood loss was minimal.                           The examined duodenum was normal. Complications:            No immediate complications. Estimated Blood Loss:     Estimated blood loss was minimal. Impression:               - Normal upper third of esophagus and middle third                            of esophagus.                           - LA Grade B reflux esophagitis with no bleeding.                           - Gastritis. Biopsied.                           - Normal examined duodenum. Recommendation:           - Patient has a contact number available for                            emergencies. The signs and symptoms of potential                            delayed complications were discussed with the                            patient. Return to normal activities tomorrow.                            Written discharge instructions were provided to the                            patient.                           -  Resume previous diet.                           - Stop omeprazole . Start Protonix  (pantoprazole ) 40                            mg PO BID for 6 weeks. If reflux symptoms are well                            controlled, reduce will reduce to 40 mg daily for                            ongoing reflux management.                           -  If ongoing reflux symptoms and/or dysphagia, plan                            for esophageal manometry and pH/impedance testing,                            to be done on PPI therapy to evaluate for                            grade/severity of breakthrough despite high-dose                            PPI.                           - Await pathology results.                           - Perform a colonoscopy today. Sandor Flatter, MD 02/06/2024 11:04:43 AM

## 2024-02-06 NOTE — Progress Notes (Signed)
 Report to PACU, RN, vss, BBS= Clear.

## 2024-02-06 NOTE — Progress Notes (Signed)
 GASTROENTEROLOGY PROCEDURE H&P NOTE   Primary Care Physician: Fernand Tracey LABOR, MD    Reason for Procedure:  Dysphagia, GERD, colon polyp surveillance  Plan:    EGD, colonoscopy  Patient is appropriate for endoscopic procedure(s) in the ambulatory ([facility]) setting.  The nature of the procedure, as well as the risks, benefits, and alternatives were carefully and thoroughly reviewed with the patient. Ample time for discussion and questions allowed. The patient understood, was satisfied, and agreed to proceed.     HPI: [name] is a 53 y.o. male who presents for EGD for evaluation of intermittent solid food dysphagia.  Symptoms have been present for the last year or so and more frequent and increasingly bothersome.  History of heartburn, currently treated with omeprazole  20 mg daily which was recently increased to twice daily.  Also presents for colonoscopy for ongoing polyp surveillance.  Last colonoscopy was 06/2019 and notable for 2 adenomatous polyps 6-8 mm removed from the rectum, internal hemorrhoids, and recommended short interval repeat in 2 years due to suboptimal prep.  Completed extended 2-day prep for procedure today.  Separately, history of constipation.  Does take daily Percocet for pain management.  Has had multiple back surgeries in the past.  Recently increased Linzess  to 290 mcg daily.  EGD 06/13/2019 for heartburn - LA Grade B reflux esophagitis with no bleeding.  - Normal upper third of esophagus and middle third of esophagus. - Gastritis. Biopsied.  - Erythematous duodenopathy. Biopsied.  - Normal second portion of the duodenum. - Repeat 6 to 8 weeks   Colonoscopy 06/2019 LLQ pain and constipation - Preparation of the colon was fair.  - Two 6 to 8 mm polyps in the rectum and in the sigmoid colon, removed with a cold snare. Resected and retrieved.  - Non- bleeding internal hemorrhoids. - Stool in ascending colon and cecum - Repeat 2 years due to suboptimal  prep  Past Medical History:  Diagnosis Date   Abdominal pain, left lower quadrant 06/21/2013   Anxiety    Arthritis    back area   Blood dyscrasia    thrombocytopenia  15 months   CAD (coronary artery disease) 2025   mild non-obstructive per cCTA   Chest pain in adult 03/24/2017   Chronic back pain    Complication of anesthesia    noticed some memory loss after surgery 07/18/12   Depression    on prozac    Dysrhythmia    PAF in 2012, felt related to OSA--now on CPAP; PRN follow-up Dr. Campbell Leiter 03/2011   Essential hypertension    Dr. Lamar Rim, [facility]     GERD (gastroesophageal reflux disease)    uses baking soda   Hyperlipidemia    Hypertension    Dr. 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   PAF (paroxysmal atrial fibrillation) (HCC) 03/24/2017   In 2012 related to sleep apnea     Pain of left hip joint 07/19/2022   Pancreatitis    hx of   Pneumonia    hx of  2005ish   Pseudoarthrosis of lumbar spine 10/02/2019   Sleep apnea    sleep study approx 18 months ago, uses cpap   Status post cervical spinal fusion 10/02/2019   Thrombocytopenia, idiopathic (HCC)    age of 53 months    Past Surgical History:  Procedure Laterality Date   APPENDECTOMY     65 months of age  and removed scar tissue   BACK SURGERY     x2 fusions, L5-S1, L4-L5, a total of 6 per patient    CHOLECYSTECTOMY     DIAGNOSTIC LAPAROSCOPY     exploratory lab, abdominal   INGUINAL HERNIA REPAIR Left    inguinal   REFRACTIVE SURGERY Bilateral 1998   SACROILIAC JOINT FUSION Right 11/21/2013   Procedure: RIGHT SACROILIAC JOINT FUSION;  Surgeon: Oneil Rodgers Priestly, MD;  Location: [facility] OR;  Service: Orthopedics;  Laterality: Right;  Right sided sacroiliac joint fusion   SACROILIAC JOINT FUSION Left 01/22/2014   Procedure: SACROILIAC JOINT FUSION;  Surgeon: Oneil Rodgers Priestly, MD;  Location: [facility] OR;   Service: Orthopedics;  Laterality: Left;  Left sided sacroiliac joint fusion   SPINAL CORD STIMULATOR IMPLANT  07/18/2012   SPINAL CORD STIMULATOR INSERTION  07/18/2012   Procedure: LUMBAR SPINAL CORD STIMULATOR INSERTION;  Surgeon: Donaciano Sprang, MD;  Location: [facility] OR;  Service: Orthopedics;  Laterality: N/A;  trial spinal cord stimulator implant    SPINAL CORD STIMULATOR REMOVAL  07/26/2012   Procedure: LUMBAR SPINAL CORD STIMULATOR REMOVAL;  Surgeon: Donaciano Sprang, MD;  Location: [facility] OR;  Service: Orthopedics;  Laterality: N/A;  REMOVAL OF TRIAL LEAD OR CONVERSION TO PERM SPINAL CORD STIMULATOR IMPLANT   TONSILLECTOMY AND ADENOIDECTOMY     TYMPANOSTOMY TUBE PLACEMENT Bilateral    x 3   VASECTOMY      Prior to Admission medications   Medication Sig Start Date End Date Taking? Authorizing Provider  amoxicillin-clavulanate (AUGMENTIN) 875-125 MG tablet Take 1 tablet by mouth 2 (two) times daily. 12/26/23  Yes [provider]  cefdinir (OMNICEF) 300 MG capsule Take 300 mg by mouth 2 (two) times daily. 01/17/24  Yes [provider]  acetaminophen  (TYLENOL ) 500 MG tablet Take 1,000 mg by mouth every 4 (four) hours as needed for mild pain (pain score 1-3) or moderate pain (pain score 4-6). 06/04/21   [provider]  amLODipine -benazepril  (LOTREL) 10-40 MG capsule Take 1 capsule by mouth at bedtime. 11/17/23   Carlin Delon BROCKS, NP  aspirin  EC 81 MG tablet Take 1 tablet (81 mg total) by mouth daily. Swallow whole. 10/03/23   Monetta Redell PARAS, MD  Azelastine HCl 137 MCG/SPRAY SOLN Place 1 spray into the nose 2 (two) times daily as needed. 04/13/21   [provider]  cyanocobalamin  (VITAMIN B12) 1000 MCG/ML injection Inject 100 mcg into the muscle every 30 (thirty) days. 11/11/15   [provider]  diclofenac  (VOLTAREN ) 75 MG EC tablet Take 75 mg by mouth 2 (two) times daily with a meal. 09/14/22   [provider]  fenofibrate  160 MG tablet Take 160 mg by  mouth daily. 07/26/17   [provider]  FLUoxetine  (PROZAC ) 10 MG capsule Take 1 capsule (10 mg total) by mouth daily. 12/20/23     FLUoxetine  (PROZAC ) 10 MG capsule Take 1 capsule (10 mg total) by mouth daily. 01/25/24     Icosapent  Ethyl (VASCEPA ) 1 g CAPS Take 2 capsules by mouth 2 (two) times daily.    [provider]  lubiprostone  (AMITIZA ) 24 MCG capsule Take 1 capsule (24 mcg total) by mouth 2 (two) times daily with a meal. Pharmacy-please d/c linzess  (pt experiencing negative side effects) 01/08/24   McMichael, Bayley M, PA-C  methocarbamol  (ROBAXIN ) 750 MG tablet Take 2 tablets (1,500 mg total) by mouth 3 (three) times daily as needed. 12/20/23     metoprolol  succinate (TOPROL  XL) 25 MG 24 hr tablet Take  1 tablet (25 mg total) by mouth daily. 10/03/23   Monetta Redell PARAS, MD  Multiple Vitamin (MULTI-VITAMIN) tablet Take 1 tablet by mouth daily. 02/25/03   [provider]  Na Sulfate-K Sulfate-Mg Sulfate concentrate (SUPREP) 17.5-3.13-1.6 GM/177ML SOLN Use as directed 01/05/24   Mollie Pfeiffer M, PA-C  nitroGLYCERIN  (NITROSTAT ) 0.4 MG SL tablet Place 1 tablet (0.4 mg total) under the tongue every 5 (five) minutes as needed for chest pain. 10/03/23   Monetta Redell PARAS, MD  oxyCODONE -acetaminophen  (PERCOCET) 10-325 MG tablet Take 1 tablet by mouth every 4 (four) hours as needed for pain 11/27/23     oxyCODONE -acetaminophen  (PERCOCET) 10-325 MG tablet Take 1 tablet by mouth every 4 (four) hours as needed for pain. 12/25/23     oxyCODONE -acetaminophen  (PERCOCET) 10-325 MG tablet Take 1 tablet by mouth every 4 (four) hours as needed for pain. 01/25/24     oxymorphone (OPANA ER) 10 MG 12 hr tablet Take 10 mg by mouth every 12 (twelve) hours. 04/21/10   [provider]  rosuvastatin  (CRESTOR ) 10 MG tablet Take 1 tablet (10 mg total) by mouth daily. 10/03/23   Monetta Redell PARAS, MD    Current Outpatient Medications  Medication Sig Dispense Refill   amoxicillin-clavulanate  (AUGMENTIN) 875-125 MG tablet Take 1 tablet by mouth 2 (two) times daily.     cefdinir (OMNICEF) 300 MG capsule Take 300 mg by mouth 2 (two) times daily.     acetaminophen  (TYLENOL ) 500 MG tablet Take 1,000 mg by mouth every 4 (four) hours as needed for mild pain (pain score 1-3) or moderate pain (pain score 4-6).     amLODipine -benazepril  (LOTREL) 10-40 MG capsule Take 1 capsule by mouth at bedtime. 90 capsule 1   aspirin  EC 81 MG tablet Take 1 tablet (81 mg total) by mouth daily. Swallow whole. 90 tablet 3   Azelastine HCl 137 MCG/SPRAY SOLN Place 1 spray into the nose 2 (two) times daily as needed.     cyanocobalamin  (VITAMIN B12) 1000 MCG/ML injection Inject 100 mcg into the muscle every 30 (thirty) days.     diclofenac  (VOLTAREN ) 75 MG EC tablet Take 75 mg by mouth 2 (two) times daily with a meal.     fenofibrate  160 MG tablet Take 160 mg by mouth daily.  1   FLUoxetine  (PROZAC ) 10 MG capsule Take 1 capsule (10 mg total) by mouth daily. 30 capsule 1   FLUoxetine  (PROZAC ) 10 MG capsule Take 1 capsule (10 mg total) by mouth daily. 30 capsule 1   Icosapent  Ethyl (VASCEPA ) 1 g CAPS Take 2 capsules by mouth 2 (two) times daily.     lubiprostone  (AMITIZA ) 24 MCG capsule Take 1 capsule (24 mcg total) by mouth 2 (two) times daily with a meal. Pharmacy-please d/c linzess  (pt experiencing negative side effects) 60 capsule 0   methocarbamol  (ROBAXIN ) 750 MG tablet Take 2 tablets (1,500 mg total) by mouth 3 (three) times daily as needed. 180 tablet 1   metoprolol  succinate (TOPROL  XL) 25 MG 24 hr tablet Take 1 tablet (25 mg total) by mouth daily. 90 tablet 3   Multiple Vitamin (MULTI-VITAMIN) tablet Take 1 tablet by mouth daily.     Na Sulfate-K Sulfate-Mg Sulfate concentrate (SUPREP) 17.5-3.13-1.6 GM/177ML SOLN Use as directed 354 mL 0   nitroGLYCERIN  (NITROSTAT ) 0.4 MG SL tablet Place 1 tablet (0.4 mg total) under the tongue every 5 (five) minutes as needed for chest pain. 25 tablet 1    oxyCODONE -acetaminophen  (PERCOCET) 10-325 MG tablet Take  1 tablet by mouth every 4 (four) hours as needed for pain 180 tablet 0   oxyCODONE -acetaminophen  (PERCOCET) 10-325 MG tablet Take 1 tablet by mouth every 4 (four) hours as needed for pain. 180 tablet 0   oxyCODONE -acetaminophen  (PERCOCET) 10-325 MG tablet Take 1 tablet by mouth every 4 (four) hours as needed for pain. 180 tablet 0   oxymorphone (OPANA ER) 10 MG 12 hr tablet Take 10 mg by mouth every 12 (twelve) hours.     rosuvastatin  (CRESTOR ) 10 MG tablet Take 1 tablet (10 mg total) by mouth daily. 90 tablet 3   Current Facility-Administered Medications  Medication Dose Route Frequency Provider Last Rate Last Admin   triamcinolone  acetonide (KENALOG ) 10 MG/ML injection 10 mg  10 mg Other Once Stover, Titorya, DPM       triamcinolone  acetonide (KENALOG ) 10 MG/ML injection 10 mg  10 mg Other Once Stover, Titorya, DPM       triamcinolone  acetonide (KENALOG ) 10 MG/ML injection 10 mg  10 mg Other Once Stover, Titorya, DPM        Allergies as of 02/06/2024 - Review Complete 12/15/2023  Allergen Reaction Noted   Nexium [esomeprazole magnesium] Hives and Swelling 07/12/2012   Wellbutrin [bupropion] Hives and Swelling 07/12/2012    Family History  Problem Relation Age of Onset   Heart attack Mother    Colon polyps Mother    Diabetes Mother    Hypertension Mother    Hyperlipidemia Mother    Cancer Father        type unknown   Diabetes Father    Hypertension Father    Hyperlipidemia Father    Hypertension Sister    Hyperlipidemia Sister    Other Sister        brain tumor   Hypertension Sister    Hyperlipidemia Sister    Heart attack Brother    Hypertension Brother    Hyperlipidemia Brother    Diabetes Maternal Grandmother    Heart disease Maternal Grandmother    Diabetes Maternal Grandfather    Diabetes Paternal Grandmother    Diabetes Paternal Grandfather    Colon cancer Neg Hx    Esophageal cancer Neg Hx     Social  History   Socioeconomic History   Marital status: Legally Separated    Spouse name: Not on file   Number of children: 3   Years of education: Not on file   Highest education level: Not on file  Occupational History   Occupation: disabled Paediatric nurse  Tobacco Use   Smoking status: Former    Current packs/day: 1.00    Average packs/day: 1 pack/day for 18.0 years (18.0 ttl pk-yrs)    Types: Cigarettes   Smokeless tobacco: Never  Vaping Use   Vaping status: Never Used  Substance and Sexual Activity   Alcohol use: No   Drug use: No   Sexual activity: Yes  Other Topics Concern   Not on file  Social History Narrative   Not on file   Social Drivers of Health   Financial Resource Strain: Not on file  Food Insecurity: Not on file  Transportation Needs: Not on file  Physical Activity: Not on file  Stress: Not on file  Social Connections: Not on file  Intimate Partner Violence: Not on file    Physical Exam: Vital signs in last 24 hours: @There  were no vitals taken for this visit. GEN: NAD EYE: Sclerae anicteric ENT: MMM CV: Non-tachycardic Pulm: CTA b/l GI: Soft, NT/ND NEURO:  Alert &  Oriented x 3   Sandor Flatter, DO [facility]   02/06/2024 9:15 AM

## 2024-02-06 NOTE — Op Note (Signed)
 [facility] Patient Name: [name] Procedure Date: 02/06/2024 10:19 AM MRN: [id number] Endoscopist: Sandor Flatter , MD, [id number] Age: 53 Referring MD:  Date of Birth: 06/25/1971 Gender: Male Account #: [id number] Procedure:                Colonoscopy Indications:              High risk colon cancer surveillance: Personal                            history of colonic polyps                           Last colonoscopy was 06/2019 and notable for 2                            adenomatous polyps 6-8 mm removed from the rectum,                            internal hemorrhoids, and recommended short                            interval repeat in 2 years due to suboptimal prep.                            Completed extended 2-day prep for procedure today. Medicines:                Monitored Anesthesia Care Procedure:                Pre-Anesthesia Assessment:                           - Prior to the procedure, a History and Physical                            was performed, and patient medications and                            allergies were reviewed. The patient's tolerance of                            previous anesthesia was also reviewed. The risks                            and benefits of the procedure and the sedation                            options and risks were discussed with the patient.                            All questions were answered, and informed consent                            was obtained. Prior Anticoagulants: The patient has  taken no anticoagulant or antiplatelet agents. ASA                            Grade Assessment: III - A patient with severe                            systemic disease. After reviewing the risks and                            benefits, the patient was deemed in satisfactory                            condition to undergo the procedure.                           After obtaining informed consent, the  colonoscope                            was passed under direct vision. Throughout the                            procedure, the patient's blood pressure, pulse, and                            oxygen saturations were monitored continuously. The                            CF [id number] #[id number] was introduced through the anus                            and advanced to the the cecum, identified by                            appendiceal orifice and ileocecal valve. The                            colonoscopy was performed without difficulty. The                            patient tolerated the procedure well. The quality                            of the bowel preparation was good. The ileocecal                            valve, appendiceal orifice, and rectum were                            photographed. Scope In: 10:35:27 AM Scope Out: 10:52:11 AM Scope Withdrawal Time: 0 hours 11 minutes 38 seconds  Total Procedure Duration: 0 hours 16 minutes 44 seconds  Findings:                 The perianal and digital rectal examinations were  normal.                           A 6 mm polyp was found in the transverse colon. The                            polyp was sessile. The polyp was removed with a                            cold snare. Resection and retrieval were complete.                            Estimated blood loss was minimal.                           The ascending colon revealed moderately excessive                            looping. Advancing the scope required using manual                            pressure and straightening and shortening the scope                            to obtain bowel loop reduction.                           Non-bleeding internal hemorrhoids were found during                            retroflexion. The hemorrhoids were small. Complications:            No immediate complications. Estimated Blood Loss:     Estimated blood loss was  minimal. Impression:               - One 6 mm polyp in the transverse colon, removed                            with a cold snare. Resected and retrieved.                           - There was significant looping of the colon.                           - Non-bleeding internal hemorrhoids. Recommendation:           - Patient has a contact number available for                            emergencies. The signs and symptoms of potential                            delayed complications were discussed with the  patient. Return to normal activities tomorrow.                            Written discharge instructions were provided to the                            patient.                           - Resume previous diet.                           - Await pathology results.                           - Repeat colonoscopy for surveillance based on                            pathology results.                           - Return to GI office PRN. Sandor Flatter, MD 02/06/2024 11:10:07 AM

## 2024-02-06 NOTE — Patient Instructions (Addendum)
 Resume previous diet and medications. Awaiting pathology results. Repeat colonoscopy date determined based on pathology results. Stop taking omeprazole . Start Protonix  (pantoprazole ) 40 mg po bid for 6 weeks then daily for symptom management.  If ongoing reflux planned for esophageal manometry and pH testing to be done to evaluate severity of breakthrough reflux despite high dose PPI   Handouts provided on colon polyps and esophagitis.     YOU HAD AN ENDOSCOPIC PROCEDURE TODAY AT THE [facility]:   Refer to the procedure report that was given to you for any specific questions about what was found during the examination.  If the procedure report does not answer your questions, please call your gastroenterologist to clarify.  If you requested that your care partner not be given the details of your procedure findings, then the procedure report has been included in a sealed envelope for you to review at your convenience later.  YOU SHOULD EXPECT: Some feelings of bloating in the abdomen. Passage of more gas than usual.  Walking can help get rid of the air that was put into your GI tract during the procedure and reduce the bloating. If you had a lower endoscopy (such as a colonoscopy or flexible sigmoidoscopy) you may notice spotting of blood in your stool or on the toilet paper. If you underwent a bowel prep for your procedure, you may not have a normal bowel movement for a few days.  Please Note:  You might notice some irritation and congestion in your nose or some drainage.  This is from the oxygen used during your procedure.  There is no need for concern and it should clear up in a day or so.  SYMPTOMS TO REPORT IMMEDIATELY:  Following lower endoscopy (colonoscopy or flexible sigmoidoscopy):  Excessive amounts of blood in the stool  Significant tenderness or worsening of abdominal pains  Swelling of the abdomen that is new, acute  Fever of 100F or higher   For urgent or emergent  issues, a gastroenterologist can be reached at any hour by calling [phone]. Do not use MyChart messaging for urgent concerns.    DIET:  We do recommend a small meal at first, but then you may proceed to your regular diet.  Drink plenty of fluids but you should avoid alcoholic beverages for 24 hours.  ACTIVITY:  You should plan to take it easy for the rest of today and you should NOT DRIVE or use heavy machinery until tomorrow (because of the sedation medicines used during the test).    FOLLOW UP: Our staff will call the number listed on your records the next business day following your procedure.  We will call around 7:15- 8:00 am to check on you and address any questions or concerns that you may have regarding the information given to you following your procedure. If we do not reach you, we will leave a message.     If any biopsies were taken you will be contacted by phone or by letter within the next 1-3 weeks.  Please call us  at [phone] if you have not heard about the biopsies in 3 weeks.    SIGNATURES/CONFIDENTIALITY: You and/or your care partner have signed paperwork which will be entered into your electronic medical record.  These signatures attest to the fact that that the information above on your After Visit Summary has been reviewed and is understood.  Full responsibility of the confidentiality of this discharge information lies with you and/or your care-partner.

## 2024-02-07 ENCOUNTER — Telehealth: Payer: Self-pay

## 2024-02-07 NOTE — Telephone Encounter (Signed)
 Left message on follow up call.

## 2024-02-08 LAB — SURGICAL PATHOLOGY

## 2024-02-13 ENCOUNTER — Ambulatory Visit: Payer: Self-pay | Admitting: Gastroenterology

## 2024-02-13 ENCOUNTER — Other Ambulatory Visit (HOSPITAL_COMMUNITY): Payer: Self-pay

## 2024-02-21 IMAGING — XA DG MYELOGRAPHY LUMBAR INJ LUMBOSACRAL
13 of 14 series · 13 of 14 positions shown · non-contrast
Comparison: 01/25/2021

CLINICAL DATA: Severe lower lumbar/lumbosacral back pain radiating
to both iliac crest regions. Occasional radiation into both thighs
and occasional radiation to the left foot.
TECHNIQUE: Contiguous axial images were obtained through the Lumbar spine after
the intrathecal infusion of infusion. Coronal and sagittal
reconstructions were obtained of the axial image sets.

[Series 1: vasc adipose · 1 of 1 slices shown (1 of 10)]
[im 1/1]
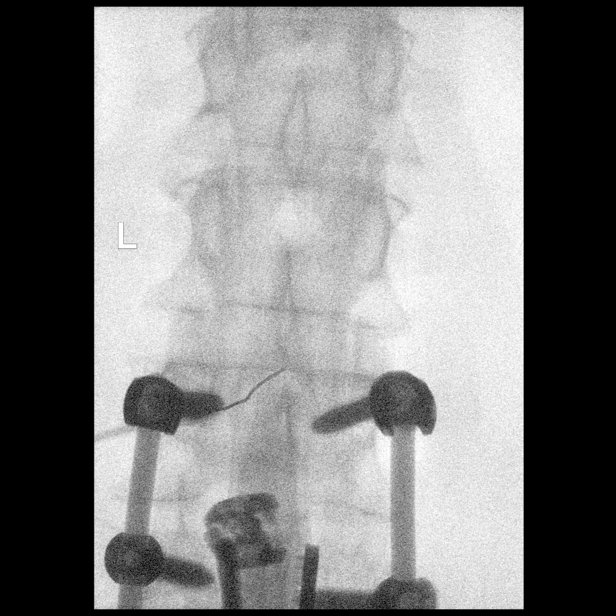

[Series 2: vasc adipose · 1 of 1 slices shown (2 of 10)]
[im 1/1]
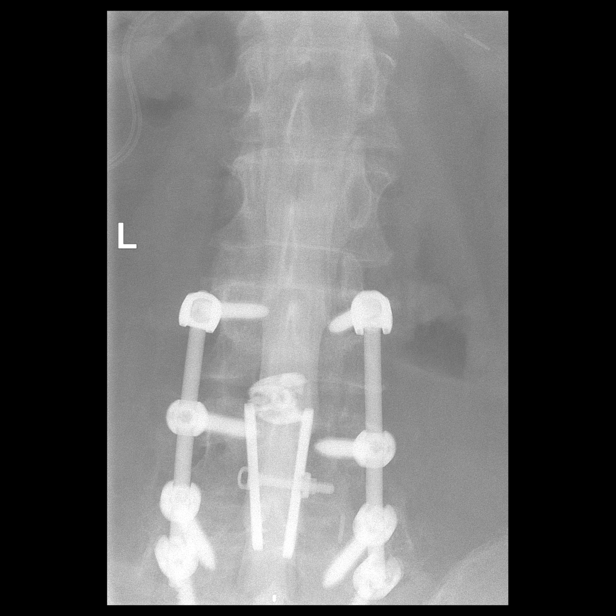

[Series 2: w lumbar spine lat · 0.15mm/px · 1 of 1 slices shown]
[im 1/1]
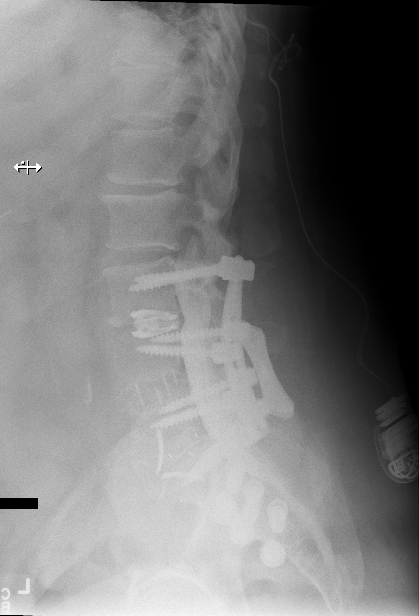

[Series 3: vasc adipose · 1 of 1 slices shown (3 of 10)]
[im 1/1]
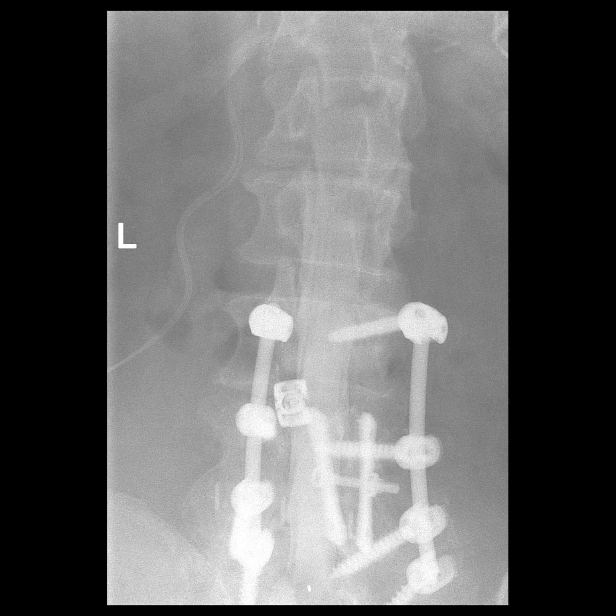

[Series 3: w lumbar spine flexion · 0.15mm/px · 1 of 1 slices shown]
[im 1/1]
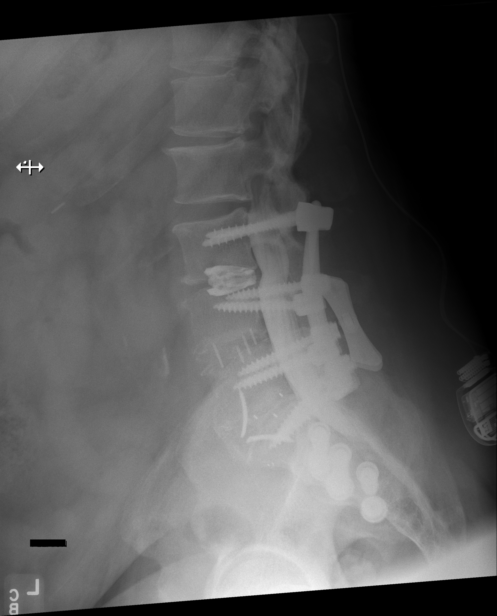

[Series 4: w lumbar spine extension · 0.15mm/px · 1 of 1 slices shown]
[im 1/1]
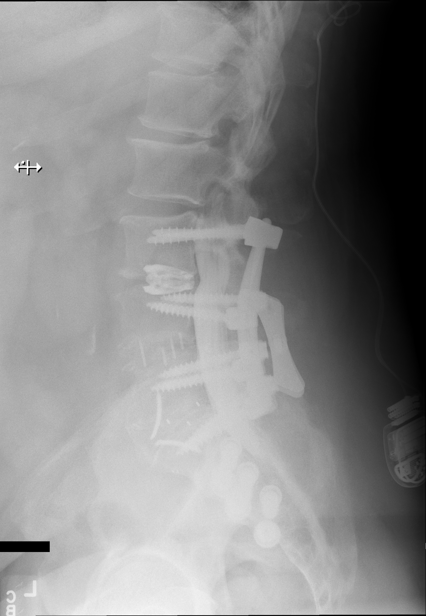

[Series 5: vasc adipose · 1 of 1 slices shown (4 of 10)]
[im 1/1]
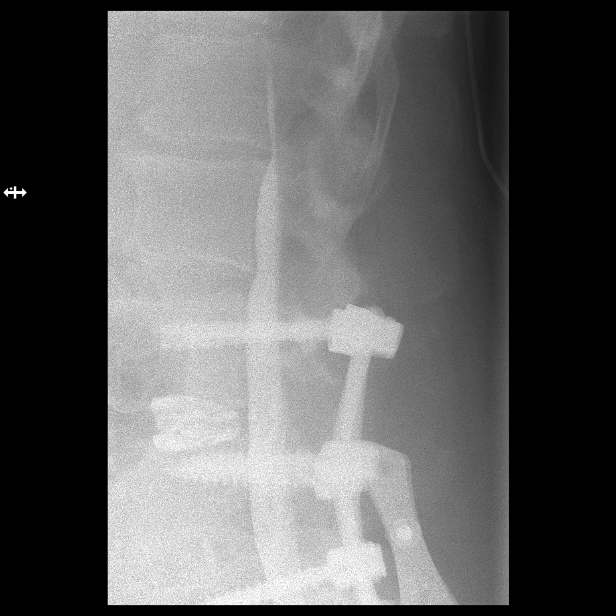

[Series 6: vasc adipose · 1 of 1 slices shown (5 of 10)]
[im 1/1]
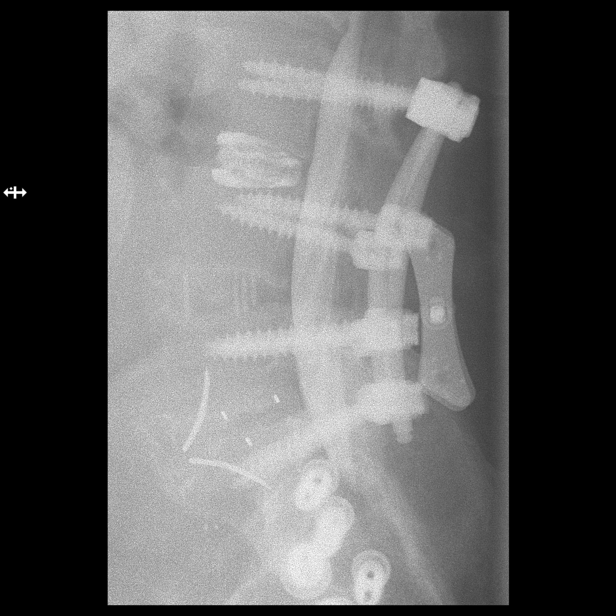

[Series 7: vasc adipose · 1 of 1 slices shown (6 of 10)]
[im 1/1]
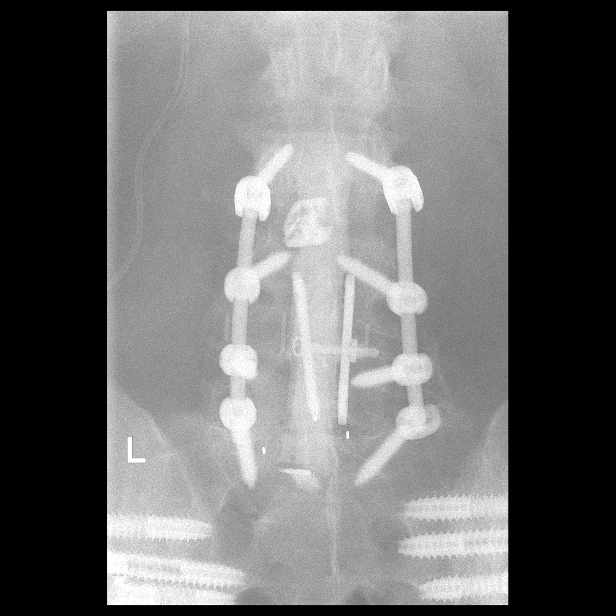

[Series 8: vasc adipose · 1 of 1 slices shown (7 of 10)]
[im 1/1]
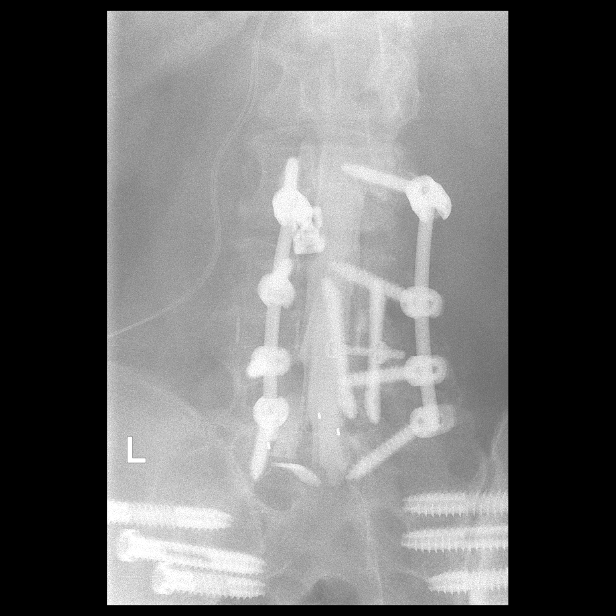

[Series 9: vasc adipose · 1 of 1 slices shown (8 of 10)]
[im 1/1]
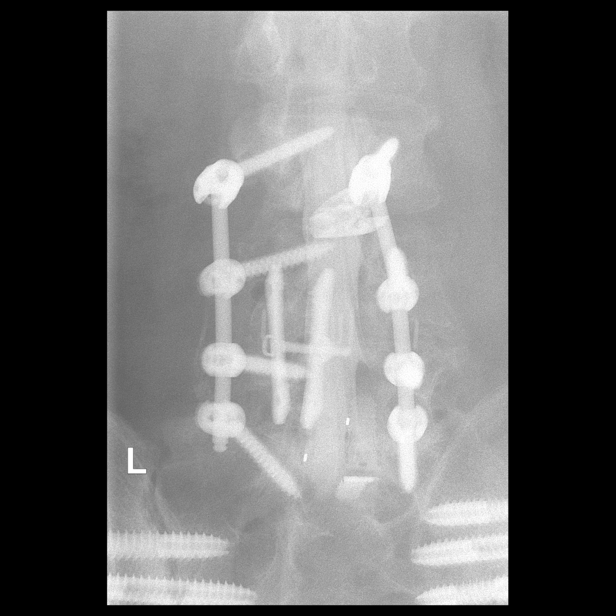

[Series 10: vasc adipose · 1 of 1 slices shown (9 of 10)]
[im 1/1]
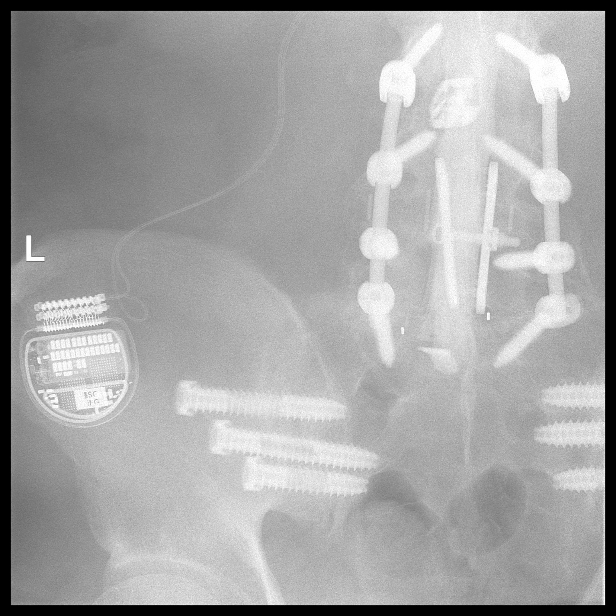

[Series 11: vasc adipose · 1 of 1 slices shown (10 of 10)]
[im 1/1]
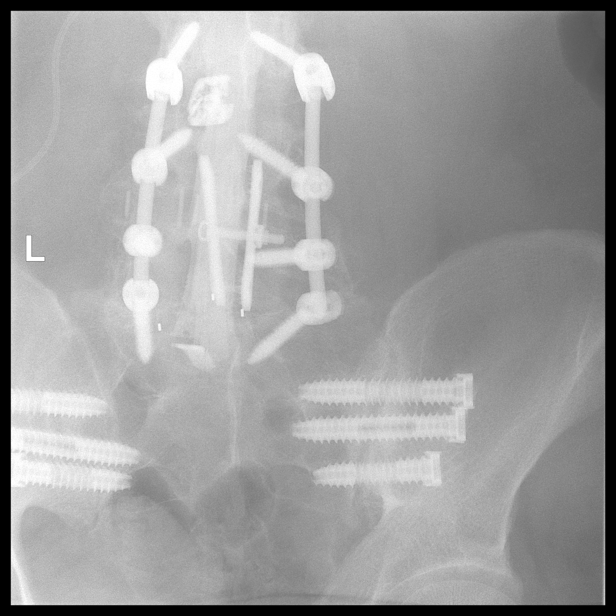

[13 of 14 positions shown; findings below may reference images not displayed]

EXAM:
LUMBAR MYELOGRAM

FLUOROSCOPY:
1 minute 30 seconds.  643.62 micro gray meter squared

PROCEDURE:
After thorough discussion of risks and benefits of the procedure
including bleeding, infection, injury to nerves, blood vessels,
adjacent structures as well as headache and CSF leak, written and
oral informed consent was obtained. Consent was obtained by Dr. Mozsolics
Kowaas. Time out form was completed.

Patient was positioned prone on the fluoroscopy table. Local
anesthesia was provided with 1% lidocaine without epinephrine after
prepped and draped in the usual sterile fashion. Puncture was
performed at L2-3 using a 5 inch 22-gauge spinal needle via left
paramedian approach. Using a single pass through the dura, the
needle was placed within the thecal sac, with return of clear CSF.
15 mL of Isovue F-4OO was injected into the thecal sac, with normal
opacification of the nerve roots and cauda equina consistent with
free flow within the subarachnoid space.

I personally performed the lumbar puncture and administered the
intrathecal contrast. I also personally performed acquisition of the
myelogram images.
FINDINGS: LUMBAR MYELOGRAM FINDINGS:

At L2-3 and above, there is a minimal disc bulge at L2-3. Flexion
extension views do not show any abnormal motion, but the patient
does not generate a great deal of bending. There may be very minimal
rocking at L2-3. From L3 to the sacrum, there is been previous
discectomy and fusion. Recent extension of the surgery through the
L3-4 level, with evidence of endplate subsidence which was not
present at radiography from 06/09/2021. There is a small anterior
extradural defect at L3-4 but no compressive stenosis. Wide patency
of the canal and foramina at L4-5 and L5-S1.

CT LUMBAR MYELOGRAM FINDINGS:

T11-12 and T12-L1: Normal

L1-2: Mild bulging of the disc. Right extraforaminal osteophyte and
bulging disc. Mild facet osteoarthritis. No apparent compressive
stenosis.

L2-3: Mild bulging of the disc. Facet degeneration with mild
hypertrophy. No compressive stenosis.

L3-4: Interval extension of the fusion with posterior decompression,
diskectomy and fusion at this level. Interbody spacer shows endplate
subsidence which is new since the radiographs [DATE]. This is
more pronounced at the inferior endplate of L3 than the superior
endplate of L4. I do not think there is definite evidence screw
motion at L3 or L4 however. Canal and foramina appear widely patent.

L4-5: Distant discectomy and fusion. Solid union with sufficient
patency of the canal and foramina.

L5-S1: Distant discectomy and fusion. Solid union with sufficient
patency of the canal and foramina.

Bilateral sacroiliac screws without evidence loosening or
malposition.
IMPRESSION: L2-3: Only minimal adjacent segment disc bulging and facet
degeneration. Perhaps minimal rocking motion with flexion extension.

L3-4: Interval posterior decompression, diskectomy and fusion.
Endplate subsidence, more pronounced at L3 than at L4. There does
not appear to be any lucency around the L3 or L4 screws however.
Wide patency of the canal and foramina.

L4 to sacrum: Distant fusion procedure with wide patency of the
canal and foramina.

Previous sacroiliac fusion. No evidence of lucency around the
sacroiliac screws.

## 2024-02-21 IMAGING — CT CT L SPINE W/ CM
1 of 8 series · 5 of 14 positions shown, 7 images · non-contrast
Comparison: 01/25/2021

CLINICAL DATA: Severe lower lumbar/lumbosacral back pain radiating
to both iliac crest regions. Occasional radiation into both thighs
and occasional radiation to the left foot.
TECHNIQUE: Contiguous axial images were obtained through the Lumbar spine after
the intrathecal infusion of infusion. Coronal and sagittal
reconstructions were obtained of the axial image sets.

[Series 3: l spine soft · axial · 0.58mm/px · z∈[+271,+471]mm · 5 of 152 slices shown, 7 images]
[im 26/152  soft-tissue]
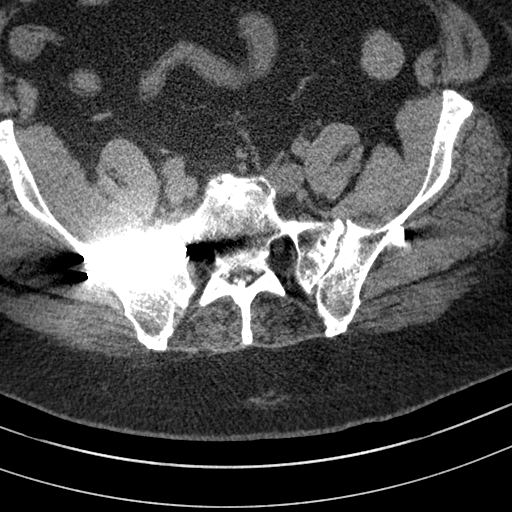
[im 26/152  bone]
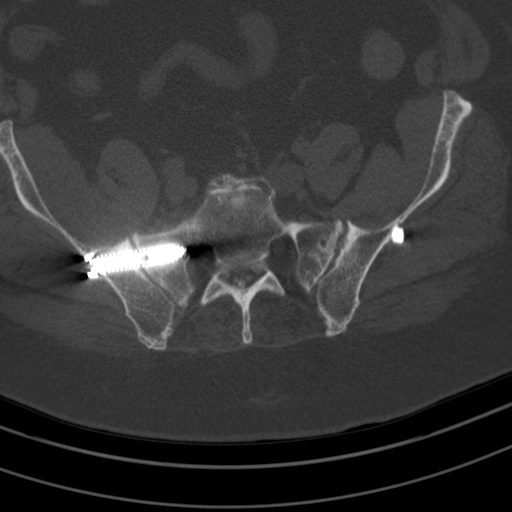
[im 51/152  bone]
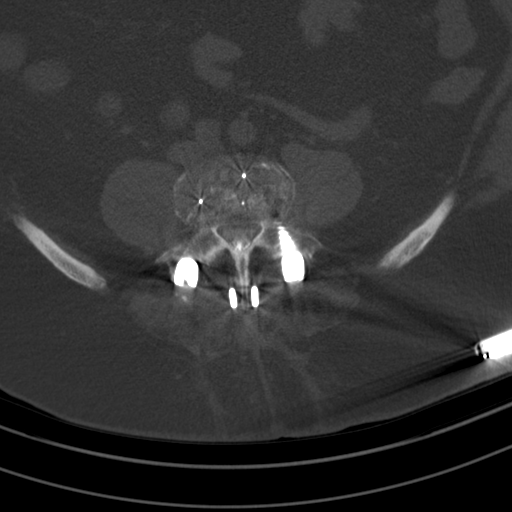
[im 76/152  bone]
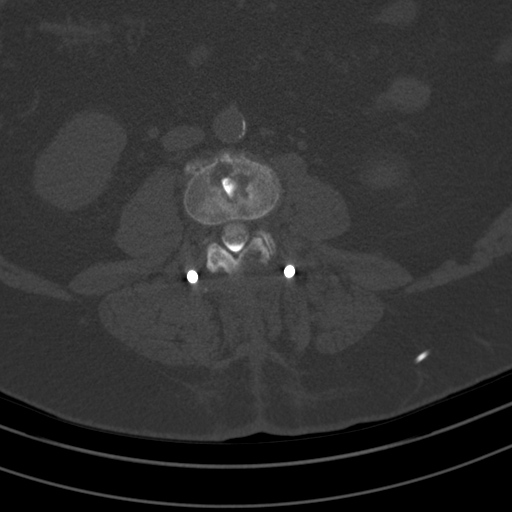
[im 101/152  bone]
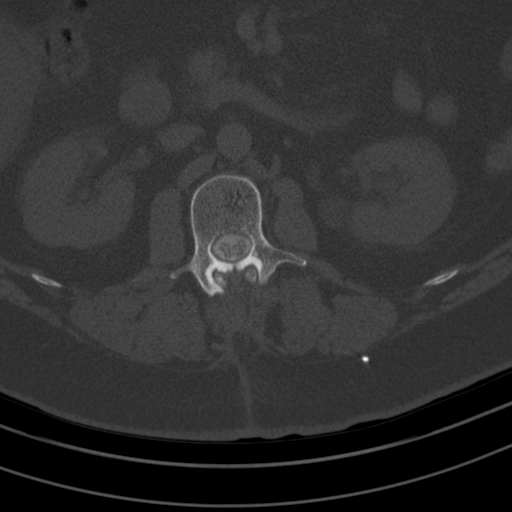
[im 126/152  soft-tissue]
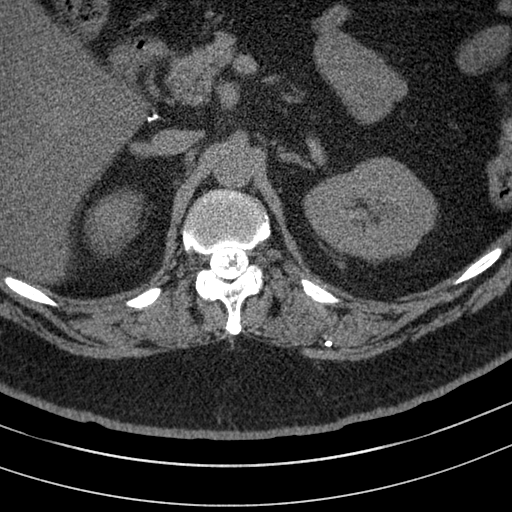
[im 126/152  bone]
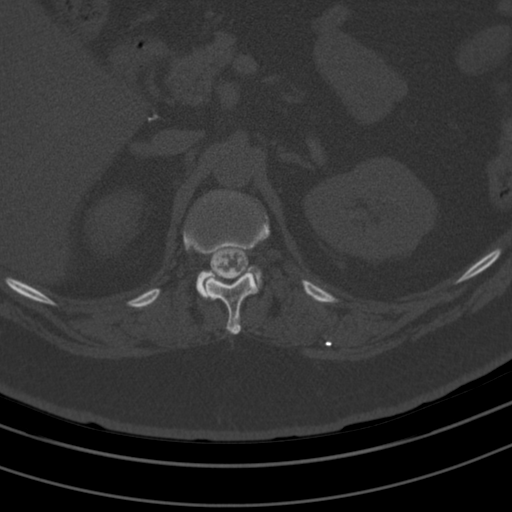

[5 of 14 positions shown; findings below may reference images not displayed]

EXAM:
LUMBAR MYELOGRAM

FLUOROSCOPY:
1 minute 30 seconds.  643.62 micro gray meter squared

PROCEDURE:
After thorough discussion of risks and benefits of the procedure
including bleeding, infection, injury to nerves, blood vessels,
adjacent structures as well as headache and CSF leak, written and
oral informed consent was obtained. Consent was obtained by Dr. Mozsolics
Kowaas. Time out form was completed.

Patient was positioned prone on the fluoroscopy table. Local
anesthesia was provided with 1% lidocaine without epinephrine after
prepped and draped in the usual sterile fashion. Puncture was
performed at L2-3 using a 5 inch 22-gauge spinal needle via left
paramedian approach. Using a single pass through the dura, the
needle was placed within the thecal sac, with return of clear CSF.
15 mL of Isovue F-4OO was injected into the thecal sac, with normal
opacification of the nerve roots and cauda equina consistent with
free flow within the subarachnoid space.

I personally performed the lumbar puncture and administered the
intrathecal contrast. I also personally performed acquisition of the
myelogram images.
FINDINGS: LUMBAR MYELOGRAM FINDINGS:

At L2-3 and above, there is a minimal disc bulge at L2-3. Flexion
extension views do not show any abnormal motion, but the patient
does not generate a great deal of bending. There may be very minimal
rocking at L2-3. From L3 to the sacrum, there is been previous
discectomy and fusion. Recent extension of the surgery through the
L3-4 level, with evidence of endplate subsidence which was not
present at radiography from 06/09/2021. There is a small anterior
extradural defect at L3-4 but no compressive stenosis. Wide patency
of the canal and foramina at L4-5 and L5-S1.

CT LUMBAR MYELOGRAM FINDINGS:

T11-12 and T12-L1: Normal

L1-2: Mild bulging of the disc. Right extraforaminal osteophyte and
bulging disc. Mild facet osteoarthritis. No apparent compressive
stenosis.

L2-3: Mild bulging of the disc. Facet degeneration with mild
hypertrophy. No compressive stenosis.

L3-4: Interval extension of the fusion with posterior decompression,
diskectomy and fusion at this level. Interbody spacer shows endplate
subsidence which is new since the radiographs [DATE]. This is
more pronounced at the inferior endplate of L3 than the superior
endplate of L4. I do not think there is definite evidence screw
motion at L3 or L4 however. Canal and foramina appear widely patent.

L4-5: Distant discectomy and fusion. Solid union with sufficient
patency of the canal and foramina.

L5-S1: Distant discectomy and fusion. Solid union with sufficient
patency of the canal and foramina.

Bilateral sacroiliac screws without evidence loosening or
malposition.
IMPRESSION: L2-3: Only minimal adjacent segment disc bulging and facet
degeneration. Perhaps minimal rocking motion with flexion extension.

L3-4: Interval posterior decompression, diskectomy and fusion.
Endplate subsidence, more pronounced at L3 than at L4. There does
not appear to be any lucency around the L3 or L4 screws however.
Wide patency of the canal and foramina.

L4 to sacrum: Distant fusion procedure with wide patency of the
canal and foramina.

Previous sacroiliac fusion. No evidence of lucency around the
sacroiliac screws.

## 2024-02-22 ENCOUNTER — Other Ambulatory Visit (HOSPITAL_COMMUNITY): Payer: Self-pay

## 2024-02-22 DIAGNOSIS — M4608 Spinal enthesopathy, sacral and sacrococcygeal region: Secondary | ICD-10-CM | POA: Diagnosis not present

## 2024-02-22 DIAGNOSIS — M961 Postlaminectomy syndrome, not elsewhere classified: Secondary | ICD-10-CM | POA: Diagnosis not present

## 2024-02-22 DIAGNOSIS — Z79891 Long term (current) use of opiate analgesic: Secondary | ICD-10-CM | POA: Diagnosis not present

## 2024-02-22 DIAGNOSIS — G894 Chronic pain syndrome: Secondary | ICD-10-CM | POA: Diagnosis not present

## 2024-02-22 MED ORDER — OXYCODONE-ACETAMINOPHEN 10-325 MG PO TABS
1.0000 | ORAL_TABLET | ORAL | 0 refills | Status: DC | PRN
  Filled 2024-02-22: qty 180, 30d supply, fill #0

## 2024-02-22 MED ORDER — METHOCARBAMOL 750 MG PO TABS
1500.0000 mg | ORAL_TABLET | Freq: Three times a day (TID) | ORAL | 1 refills | Status: AC
  Filled 2024-02-22: qty 180, 30d supply, fill #0

## 2024-02-26 DIAGNOSIS — E538 Deficiency of other specified B group vitamins: Secondary | ICD-10-CM | POA: Diagnosis not present

## 2024-02-26 DIAGNOSIS — M961 Postlaminectomy syndrome, not elsewhere classified: Secondary | ICD-10-CM | POA: Diagnosis not present

## 2024-02-26 DIAGNOSIS — J0120 Acute ethmoidal sinusitis, unspecified: Secondary | ICD-10-CM | POA: Diagnosis not present

## 2024-02-26 DIAGNOSIS — Z6841 Body Mass Index (BMI) 40.0 and over, adult: Secondary | ICD-10-CM | POA: Diagnosis not present

## 2024-02-26 DIAGNOSIS — T24011A Burn of unspecified degree of right thigh, initial encounter: Secondary | ICD-10-CM | POA: Diagnosis not present

## 2024-03-21 ENCOUNTER — Other Ambulatory Visit (HOSPITAL_COMMUNITY): Payer: Self-pay

## 2024-03-21 DIAGNOSIS — Z79891 Long term (current) use of opiate analgesic: Secondary | ICD-10-CM | POA: Diagnosis not present

## 2024-03-21 DIAGNOSIS — M4608 Spinal enthesopathy, sacral and sacrococcygeal region: Secondary | ICD-10-CM | POA: Diagnosis not present

## 2024-03-21 DIAGNOSIS — G894 Chronic pain syndrome: Secondary | ICD-10-CM | POA: Diagnosis not present

## 2024-03-21 DIAGNOSIS — M961 Postlaminectomy syndrome, not elsewhere classified: Secondary | ICD-10-CM | POA: Diagnosis not present

## 2024-03-21 MED ORDER — METHOCARBAMOL 750 MG PO TABS
1500.0000 mg | ORAL_TABLET | Freq: Three times a day (TID) | ORAL | 1 refills | Status: DC | PRN
  Filled 2024-03-21: qty 180, 30d supply, fill #0
  Filled 2024-04-23: qty 180, 30d supply, fill #1

## 2024-03-21 MED ORDER — FLUOXETINE HCL 10 MG PO CAPS
10.0000 mg | ORAL_CAPSULE | Freq: Every day | ORAL | 1 refills | Status: DC
  Filled 2024-03-21: qty 30, 30d supply, fill #0
  Filled 2024-04-23: qty 30, 30d supply, fill #1

## 2024-03-21 MED ORDER — LUBIPROSTONE 24 MCG PO CAPS
24.0000 ug | ORAL_CAPSULE | Freq: Every day | ORAL | 2 refills | Status: DC
  Filled 2024-03-21 (×2): qty 30, 30d supply, fill #0

## 2024-03-22 ENCOUNTER — Other Ambulatory Visit (HOSPITAL_COMMUNITY): Payer: Self-pay

## 2024-03-25 ENCOUNTER — Other Ambulatory Visit (HOSPITAL_COMMUNITY): Payer: Self-pay

## 2024-03-25 MED ORDER — OXYCODONE-ACETAMINOPHEN 10-325 MG PO TABS
1.0000 | ORAL_TABLET | ORAL | 0 refills | Status: DC | PRN
Start: 2024-03-25 — End: 2024-04-23
  Filled 2024-03-25: qty 180, 30d supply, fill #0

## 2024-04-09 ENCOUNTER — Other Ambulatory Visit: Payer: Self-pay

## 2024-04-09 ENCOUNTER — Telehealth: Payer: Self-pay | Admitting: Gastroenterology

## 2024-04-09 MED ORDER — LUBIPROSTONE 24 MCG PO CAPS: 24.0000 ug | ORAL_CAPSULE | Freq: Two times a day (BID) | ORAL | 3 refills | Status: AC

## 2024-04-09 NOTE — Telephone Encounter (Signed)
 Rx for Amitiza  sent to [facility] as requested.

## 2024-04-09 NOTE — Telephone Encounter (Signed)
 Inbound call from pharmacy requesting a call back in regards to patient medication clarification. Please advise.

## 2024-04-16 ENCOUNTER — Other Ambulatory Visit (HOSPITAL_COMMUNITY): Payer: Self-pay

## 2024-04-23 ENCOUNTER — Other Ambulatory Visit (HOSPITAL_COMMUNITY): Payer: Self-pay

## 2024-04-23 DIAGNOSIS — G894 Chronic pain syndrome: Secondary | ICD-10-CM | POA: Diagnosis not present

## 2024-04-23 DIAGNOSIS — M961 Postlaminectomy syndrome, not elsewhere classified: Secondary | ICD-10-CM | POA: Diagnosis not present

## 2024-04-23 DIAGNOSIS — M4608 Spinal enthesopathy, sacral and sacrococcygeal region: Secondary | ICD-10-CM | POA: Diagnosis not present

## 2024-04-23 DIAGNOSIS — Z79891 Long term (current) use of opiate analgesic: Secondary | ICD-10-CM | POA: Diagnosis not present

## 2024-04-23 MED ORDER — LUBIPROSTONE 24 MCG PO CAPS
24.0000 ug | ORAL_CAPSULE | Freq: Every day | ORAL | 2 refills | Status: AC
  Filled 2024-04-23 (×2): qty 30, 30d supply, fill #0

## 2024-04-23 MED ORDER — OXYCODONE-ACETAMINOPHEN 10-325 MG PO TABS
1.0000 | ORAL_TABLET | ORAL | 0 refills | Status: DC | PRN
  Filled 2024-04-23: qty 180, 30d supply, fill #0

## 2024-04-25 DIAGNOSIS — E538 Deficiency of other specified B group vitamins: Secondary | ICD-10-CM | POA: Diagnosis not present

## 2024-04-25 DIAGNOSIS — J302 Other seasonal allergic rhinitis: Secondary | ICD-10-CM | POA: Diagnosis not present

## 2024-05-16 ENCOUNTER — Other Ambulatory Visit: Payer: Self-pay

## 2024-05-16 NOTE — Progress Notes (Signed)
 Cardiology Office Note:    Date:  05/18/2024   ID:  [name], DOB Sep 24, 1970, MRN [id number]  PCP:  Fernand Tracey LABOR, MD  Cardiologist:  Redell Leiter, MD    Referring MD: Fernand Tracey LABOR, MD    ASSESSMENT:    1. Mild CAD   2. Mixed hyperlipidemia   3. Essential hypertension   4. PAF (paroxysmal atrial fibrillation) (HCC)    PLAN:    In order of problems listed above:  [name] is doing very well has very mild almost minimal CAD good effective medical therapy including low-dose aspirin  lipid-lowering with rosuvastatin  and a highly purified fish oil icosapent  ethyl and antianginal therapy with his calcium  channel blocker.  He has not needed nitroglycerin . Stable blood pressure is controlled on measurements continue current treatment calcium  channel blocker ACE inhibitor No recurrence of atrial fibrillation not on antiarrhythmic drug or anticoagulate this time   Next appointment: He is reassured by the findings of CTA stable prefers to follow-up with his PCP and I will see back in the office as needed.   Medication Adjustments/Labs and Tests Ordered: Current medicines are reviewed at length with the patient today.  Concerns regarding medicines are outlined above.  No orders of the defined types were placed in this encounter.  No orders of the defined types were placed in this encounter.    History of Present Illness:    [name] is a 53 y.o. male with a hx of chest pain strong family history of premature CAD hypertension hyperlipidemia and paroxysmal atrial fibrillation last seen 10/03/2023.  On the visit he had a cardiac CTA reported 10/18/2023 coronary calcium  score was quite low at 2.75 and there is minimal coronary atherosclerosis.  Record allergy perspective doing well having no anginal discomfort and effective lipid-lowering therapy without muscle pain or weakness. No syncope palpitation shortness of breath or edema.  Compliance with diet, lifestyle and  medications: Yes Past Medical History:  Diagnosis Date   Abdominal pain, left lower quadrant 06/21/2013   Anxiety    Arthritis    back area   Blood dyscrasia    thrombocytopenia  15 months   CAD (coronary artery disease) 2025   mild non-obstructive per cCTA   Chest pain in adult 03/24/2017   Chronic back pain    Complication of anesthesia    noticed some memory loss after surgery 07/18/12   Depression    on prozac    Dysrhythmia    PAF in 2012, felt related to OSA--now on CPAP; PRN follow-up Dr. Campbell Leiter 03/2011   Essential hypertension    [facility]. Lamar Rim[facility]     GERD (gastroesophageal reflux disease)    uses baking soda   Hyperlipidemia    Hypertension    Dr. 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   amLODipine -benazepril  (LOTREL) 10-40 MG capsule Take 1 capsule by mouth at bedtime.   aspirin  EC 81 MG tablet Take 1 tablet (81 mg total) by mouth daily. Swallow whole.   Azelastine HCl 137 MCG/SPRAY SOLN Place 1 spray into the nose 2 (two) times daily as needed.   cyanocobalamin  (VITAMIN B12) 1000 MCG/ML injection Inject 100 mcg into the muscle every 30 (thirty) days.   FLUoxetine  (PROZAC ) 10 MG capsule Take 1 capsule (10 mg total) by mouth daily.   lubiprostone   (AMITIZA ) 24 MCG capsule Take 1 capsule (24 mcg total) by mouth 2 (two) times daily with a meal.   lubiprostone  (AMITIZA ) 24 MCG capsule Take 1 capsule by mouth once a day take with meal   methocarbamol  (ROBAXIN ) 750 MG tablet Take 2 tablets (1,500 mg total) by mouth 3 (three) times daily as needed   nitroGLYCERIN  (NITROSTAT ) 0.4 MG SL tablet Place 1 tablet (0.4 mg total) under the tongue every 5 (five) minutes as needed for chest pain.   oxyCODONE -acetaminophen  (PERCOCET) 10-325 MG tablet Take 1 tablet by mouth every 4 (four) hours as needed for pain   pantoprazole  (PROTONIX ) 40 MG tablet Take 1 tablet (40 mg total) by mouth 2 (two) times daily.   rosuvastatin  (CRESTOR ) 10 MG tablet Take 10 mg by mouth daily.   [DISCONTINUED] Icosapent  Ethyl (VASCEPA ) 1 g CAPS Take 2 capsules by mouth 2 (two) times daily.      EKGs/Labs/Other Studies Reviewed:    The following studies were reviewed today:  Cardiac Studies & Procedures   ______________________________________________________________________________________________   STRESS TESTS  MYOCARDIAL PERFUSION IMAGING 04/05/2017  Interpretation Summary  Nuclear stress EF: 48%. The left ventricular ejection fraction is mildly decreased (45-54%).  There is no evidence of ischemia or infarction. The LV function is mildly reduced.  This is a low risk study.   ECHOCARDIOGRAM  ECHOCARDIOGRAM COMPLETE 12/27/2023  Narrative ECHOCARDIOGRAM REPORT    Patient Name:   [name] Date of Exam: 12/27/2023 Medical Rec #:  [id number]         Height:       71.0 in Accession #:    [id number]        Weight:       350.2 lb Date of Birth:  [birth year]         BSA:          2.676 m Patient Age:    53 years          BP:           120/80 mmHg Patient Gender: M                 HR:           85 bpm. Exam Location:  [place]  Procedure: 2D Echo, Cardiac Doppler, Color Doppler and Strain Analysis (Both Spectral and Color Flow Doppler were utilized  during procedure).  Indications:    Coronary artery disease involving native coronary artery of native heart without angina pectoris [I25.10 (ICD-10-CM)]; DOE (dyspnea on exertion) [R06.09 (ICD-10-CM)]  History:        Patient has no prior history of Echocardiogram examinations. CAD, Arrythmias:PVC, Signs/Symptoms:Chest Pain and Dyspnea; Risk Factors:Hypertension and Dyslipidemia.  Sonographer:    Charlie Jointer RDCS Referring Phys: [phone] DELON JAYSON HOOVER   Sonographer Comments: Suboptimal parasternal window. Image acquisition challenging due to patient body habitus.  IMPRESSIONS   1. Left ventricular ejection fraction, by estimation, is 60 to 65%. The left ventricle has normal function. Left ventricular endocardial border not optimally defined to evaluate regional wall motion. There is mild left ventricular hypertrophy. Left ventricular diastolic parameters are consistent with Grade I diastolic dysfunction (impaired relaxation). The average left ventricular global longitudinal strain is -15.8 %. The global longitudinal strain is abnormal. 2. Right ventricular systolic function is normal. The right ventricular size is normal. 3. The mitral valve is normal in structure. No evidence of mitral valve regurgitation. No evidence of mitral stenosis. 4. The aortic valve is normal in structure. Aortic valve regurgitation is not visualized. No aortic stenosis is present. 5. The inferior vena cava is normal in size with greater than 50% respiratory variability, suggesting right atrial pressure of 3 mmHg.  FINDINGS Left Ventricle: Left ventricular ejection fraction, by estimation, is 60 to 65%. The left ventricle has normal function. Left ventricular endocardial border not optimally defined to evaluate regional wall motion. The average left ventricular global longitudinal strain is -15.8 %. Strain was performed and the global longitudinal strain is abnormal. The left ventricular internal cavity size  was normal in size. There is mild left ventricular hypertrophy. Left ventricular diastolic parameters are consistent with Grade I diastolic dysfunction (impaired relaxation).  Right Ventricle: The right ventricular size is normal. No increase in right ventricular wall thickness. Right ventricular systolic function is normal.  Left Atrium: Left atrial size was normal in size.  Right Atrium: Right atrial size was normal in size.  Pericardium: There is no evidence of pericardial effusion.  Mitral Valve: The mitral valve is normal in structure. No evidence of mitral valve regurgitation. No evidence of mitral valve stenosis.  Tricuspid Valve: The tricuspid valve is normal in structure. Tricuspid valve regurgitation is not demonstrated. No evidence of tricuspid stenosis.  Aortic Valve: The aortic valve is normal in structure. Aortic valve regurgitation is not visualized. No aortic stenosis is present.  Pulmonic Valve: The pulmonic valve was normal in structure. Pulmonic valve regurgitation is not visualized. No evidence of pulmonic stenosis.  Aorta: The aortic root is normal in size and structure.  Venous: The inferior vena cava is normal in size with greater than 50% respiratory variability, suggesting right atrial pressure of 3 mmHg.  IAS/Shunts: No atrial level shunt detected by color flow Doppler.   LEFT VENTRICLE PLAX 2D LVIDd:         5.40 cm   Diastology LVIDs:         3.90 cm   LV e' medial:    7.67 cm/s LV PW:         1.30 cm   LV E/e' medial:  9.3 LV IVS:        1.30 cm   LV e' lateral:   9.03 cm/s LVOT diam:     2.30 cm   LV E/e' lateral: 7.9 LV SV:         73 LV SV Index:   27        2D Longitudinal Strain LVOT Area:     4.15 cm  2D Strain GLS Avg:     -15.8 %   RIGHT VENTRICLE             IVC RV Basal diam:  4.20 cm     IVC diam: 2.00 cm RV Mid diam:    3.80 cm RV S prime:     14.97 cm/s TAPSE (M-mode): 2.6 cm  LEFT ATRIUM  Index        RIGHT ATRIUM            Index LA Vol (A2C):   40.5 ml 15.14 ml/m  RA Area:     15.20 cm LA Vol (A4C):   38.0 ml 14.20 ml/m  RA Volume:   29.60 ml  11.06 ml/m LA Biplane Vol: 40.1 ml 14.99 ml/m AORTIC VALVE LVOT Vmax:   98.57 cm/s LVOT Vmean:  62.833 cm/s LVOT VTI:    0.175 m  AORTA Ao Root diam: 3.40 cm Ao Desc diam: 2.50 cm  MITRAL VALVE MV Area (PHT): 3.85 cm    SHUNTS MV Decel Time: 197 msec    Systemic VTI:  0.18 m MV E velocity: 71.25 cm/s  Systemic Diam: 2.30 cm MV A velocity: 91.20 cm/s MV E/A ratio:  0.78  Lamar Fitch MD Electronically signed by Lamar Fitch MD Signature Date/Time: 12/27/2023/7:51:35 PM    Final    MONITORS  LONG TERM MONITOR (3-14 DAYS) 11/02/2023   CT SCANS  CT CORONARY MORPH W/CTA COR W/SCORE 10/17/2023  Addendum 11/02/2023 11:38 PM ADDENDUM REPORT: 11/02/2023 23:36  EXAM: OVER-READ INTERPRETATION  CT CHEST  The following report is an over-read performed by radiologist Dr. Oneil Devonshire of [facility] on 11/02/2023. This over-read does not include interpretation of cardiac or coronary anatomy or pathology. The coronary calcium  score/coronary CTA interpretation by the cardiologist is attached.  COMPARISON:  None.  FINDINGS: Cardiovascular: There are no significant extracardiac vascular findings.  Mediastinum/Nodes: There are no enlarged lymph nodes within the visualized mediastinum.  Lungs/Pleura: There is no pleural effusion. The visualized lungs appear clear.  Upper abdomen: No significant findings in the visualized upper abdomen.  Musculoskeletal/Chest wall: No chest wall mass or suspicious osseous findings within the visualized chest. Spinal stimulator is noted in place.  IMPRESSION: No significant extracardiac findings within the visualized chest.   Electronically Signed By: Oneil Devonshire M.D. On: 11/02/2023 23:36  Narrative CLINICAL DATA:  Chest pain  EXAM: Cardiac/Coronary CTA  TECHNIQUE: A  non-contrast, gated CT scan was obtained with axial slices of 3 mm through the heart for calcium  scoring. Calcium  scoring was performed using the Agatston method. A 120 kV prospective, gated, contrast cardiac scan was obtained. Gantry rotation speed was 250 msecs and collimation was 0.6 mm. Two sublingual nitroglycerin  tablets (0.8 mg) were given. The 3D data set was reconstructed in 5% intervals of the 35-75% of the R-R cycle. Diastolic phases were analyzed on a dedicated workstation using MPR, MIP, and VRT modes. The patient received 95 cc of contrast.  FINDINGS: Image quality: Excellent.  Noise artifact is: Limited.  Coronary Arteries:  Normal coronary origin.  Right dominance.  Left main: The left main is a large caliber vessel with a normal take off from the left coronary cusp that bifurcates to form a left anterior descending artery and a left circumflex artery. There is no plaque or stenosis.  Left anterior descending artery: The LAD gives off 3 patent diagonal branches. There is minimal calcified plaque in the mid LAD with associated stenosis of < 25%.  Left circumflex artery: The LCX is non-dominant and patent with no evidence of plaque or stenosis. The LCX gives off 3 patent obtuse marginal branches.  Right coronary artery: The RCA is dominant with normal take off from the right coronary cusp. There is no evidence of plaque or stenosis. The RCA terminates as a PDA and right posterolateral branch without evidence of plaque or stenosis.  Right  Atrium: Right atrial size is within normal limits.  Right Ventricle: The right ventricular cavity is within normal limits.  Left Atrium: Left atrial size is normal in size with no left atrial appendage filling defect.  Left Ventricle: The ventricular cavity size is within normal limits.  Pulmonary arteries: Normal in size.  Pulmonary veins: Normal pulmonary venous drainage.  Pericardium: Normal thickness without  significant effusion or calcium  present.  Cardiac valves: The aortic valve is trileaflet without significant calcification. The mitral valve is normal without significant calcification.  Aorta: Normal caliber without significant disease.  Extra-cardiac findings: See attached radiology report for non-cardiac structures.  IMPRESSION: 1. Coronary calcium  score of 2.75. This was 55th percentile for age-, sex, and race-matched controls.  2. Total plaque volume 51 mm3 which is 32nd percentile for age- and sex-matched controls (calcified plaque 94mm3; non-calcified plaque 64mm3). TPV is mild.  3. Normal coronary origin with right dominance.  4. Minimal atherosclerosis.  <25% mid LAD.  5. Recommend preventive therapy and risk factor modification.  6. Consider non atherosclerotic causes of chest pain.  RECOMMENDATIONS: 1. CAD-RADS 0: No evidence of CAD (0%). Consider non-atherosclerotic causes of chest pain.  2. CAD-RADS 1: Minimal non-obstructive CAD (0-24%). Consider non-atherosclerotic causes of chest pain. Consider preventive therapy and risk factor modification.  3. CAD-RADS 2: Mild non-obstructive CAD (25-49%). Consider non-atherosclerotic causes of chest pain. Consider preventive therapy and risk factor modification.  4. CAD-RADS 3: Moderate stenosis. Consider symptom-guided anti-ischemic pharmacotherapy as well as risk factor modification per guideline directed care. Additional analysis with CT FFR will be submitted.  5. CAD-RADS 4: Severe stenosis. (70-99% or > 50% left main). Cardiac catheterization or CT FFR is recommended. Consider symptom-guided anti-ischemic pharmacotherapy as well as risk factor modification per guideline directed care. Invasive coronary angiography recommended with revascularization per published guideline statements.  6. CAD-RADS 5: Total coronary occlusion (100%). Consider cardiac catheterization or viability assessment. Consider  symptom-guided anti-ischemic pharmacotherapy as well as risk factor modification per guideline directed care.  7. CAD-RADS N: Non-diagnostic study. Obstructive CAD can't be excluded. Alternative evaluation is recommended.  Wilbert Bihari, MD  Electronically Signed: By: Wilbert Bihari M.D. On: 10/18/2023 16:28     ______________________________________________________________________________________________             Recent Labs: 10/01/2023: Hemoglobin 15.9; Magnesium 1.9; Platelets 273 10/03/2023: BUN 6; Creatinine, Ser 0.56; Potassium 3.6; Sodium 142  Recent Lipid Panel No results found for: CHOL, TRIG, HDL, CHOLHDL, VLDL, LDLCALC, LDLDIRECT  Physical Exam:    VS:  BP (!) 148/80   Pulse 80   Ht 6' 1 (1.854 m)   Wt (!) 354 lb 12.8 oz (160.9 kg)   SpO2 96%   BMI 46.81 kg/m     Wt Readings from Last 3 Encounters:  05/17/24 (!) 354 lb 12.8 oz (160.9 kg)  02/06/24 (!) 344 lb 9.6 oz (156.3 kg)  12/15/23 (!) 350 lb 4 oz (158.9 kg)     GEN:  Well nourished, well developed in no acute distress HEENT: Normal NECK: No JVD; No carotid bruits LYMPHATICS: No lymphadenopathy CARDIAC: RRR, no murmurs, rubs, gallops RESPIRATORY:  Clear to auscultation without rales, wheezing or rhonchi  ABDOMEN: Soft, non-tender, non-distended MUSCULOSKELETAL:  No edema; No deformity  SKIN: Warm and dry NEUROLOGIC:  Alert and oriented x 3 PSYCHIATRIC:  Normal affect    Signed, Redell Leiter, MD  05/18/2024 9:24 AM    [facility]

## 2024-05-17 ENCOUNTER — Ambulatory Visit: Attending: Cardiology | Admitting: Cardiology

## 2024-05-17 ENCOUNTER — Encounter: Payer: Self-pay | Admitting: Cardiology

## 2024-05-17 ENCOUNTER — Other Ambulatory Visit: Payer: Self-pay

## 2024-05-17 VITALS — BP 148/80 | HR 80 | Ht 73.0 in | Wt 354.8 lb

## 2024-05-17 DIAGNOSIS — I480 Paroxysmal atrial fibrillation: Secondary | ICD-10-CM

## 2024-05-17 DIAGNOSIS — I2510 Atherosclerotic heart disease of native coronary artery without angina pectoris: Secondary | ICD-10-CM | POA: Diagnosis not present

## 2024-05-17 DIAGNOSIS — E782 Mixed hyperlipidemia: Secondary | ICD-10-CM | POA: Diagnosis not present

## 2024-05-17 DIAGNOSIS — I10 Essential (primary) hypertension: Secondary | ICD-10-CM

## 2024-05-17 MED ORDER — ICOSAPENT ETHYL 1 G PO CAPS: 2.0000 g | ORAL_CAPSULE | Freq: Every day | ORAL | 3 refills | Status: AC

## 2024-05-17 NOTE — Patient Instructions (Signed)
 Medication Instructions:  Your physician recommends that you continue on your current medications as directed. Please refer to the Current Medication list given to you today.  *If you need a refill on your cardiac medications before your next appointment, please call your pharmacy*  Lab Work: None If you have labs (blood work) drawn today and your tests are completely normal, you will receive your results only by: MyChart Message (if you have MyChart) OR A paper copy in the mail If you have any lab test that is abnormal or we need to change your treatment, we will call you to review the results.  Testing/Procedures: None  Follow-Up: At [facility], you and your health needs are our priority.  As part of our continuing mission to provide you with exceptional heart care, our providers are all part of one team.  This team includes your primary Cardiologist (physician) and Advanced Practice Providers or APPs (Physician Assistants and Nurse Practitioners) who all work together to provide you with the care you need, when you need it.  Your next appointment:   Follow up as needed  Provider:   Redell Leiter, MD    We recommend signing up for the patient portal called MyChart.  Sign up information is provided on this After Visit Summary.  MyChart is used to connect with patients for Virtual Visits (Telemedicine).  Patients are able to view lab/test results, encounter notes, upcoming appointments, etc.  Non-urgent messages can be sent to your provider as well.   To learn more about what you can do with MyChart, go to [email or web address].   Other Instructions None

## 2024-05-21 ENCOUNTER — Other Ambulatory Visit (HOSPITAL_COMMUNITY): Payer: Self-pay

## 2024-05-21 DIAGNOSIS — M4608 Spinal enthesopathy, sacral and sacrococcygeal region: Secondary | ICD-10-CM | POA: Diagnosis not present

## 2024-05-21 DIAGNOSIS — M961 Postlaminectomy syndrome, not elsewhere classified: Secondary | ICD-10-CM | POA: Diagnosis not present

## 2024-05-21 DIAGNOSIS — Z79891 Long term (current) use of opiate analgesic: Secondary | ICD-10-CM | POA: Diagnosis not present

## 2024-05-21 DIAGNOSIS — G894 Chronic pain syndrome: Secondary | ICD-10-CM | POA: Diagnosis not present

## 2024-05-21 MED ORDER — METHOCARBAMOL 750 MG PO TABS
1500.0000 mg | ORAL_TABLET | Freq: Three times a day (TID) | ORAL | 1 refills | Status: AC | PRN
  Filled 2024-05-21 (×3): qty 180, 30d supply, fill #0
  Filled 2024-06-19: qty 180, 30d supply, fill #1

## 2024-05-21 MED ORDER — LUBIPROSTONE 24 MCG PO CAPS
24.0000 ug | ORAL_CAPSULE | Freq: Every day | ORAL | 2 refills | Status: AC
  Filled 2024-05-21: qty 30, 30d supply, fill #0

## 2024-05-21 MED ORDER — FLUOXETINE HCL 10 MG PO CAPS
10.0000 mg | ORAL_CAPSULE | Freq: Every day | ORAL | 1 refills | Status: AC
  Filled 2024-05-21: qty 30, 30d supply, fill #0
  Filled 2024-06-19: qty 30, 30d supply, fill #1

## 2024-05-21 MED ORDER — OXYCODONE-ACETAMINOPHEN 10-325 MG PO TABS
1.0000 | ORAL_TABLET | ORAL | 0 refills | Status: DC | PRN
  Filled 2024-05-21: qty 180, 30d supply, fill #0

## 2024-06-10 DIAGNOSIS — M5416 Radiculopathy, lumbar region: Secondary | ICD-10-CM | POA: Diagnosis not present

## 2024-06-17 DIAGNOSIS — E538 Deficiency of other specified B group vitamins: Secondary | ICD-10-CM | POA: Diagnosis not present

## 2024-06-19 ENCOUNTER — Other Ambulatory Visit (HOSPITAL_COMMUNITY): Payer: Self-pay

## 2024-06-19 DIAGNOSIS — G894 Chronic pain syndrome: Secondary | ICD-10-CM | POA: Diagnosis not present

## 2024-06-19 DIAGNOSIS — M4608 Spinal enthesopathy, sacral and sacrococcygeal region: Secondary | ICD-10-CM | POA: Diagnosis not present

## 2024-06-19 DIAGNOSIS — M961 Postlaminectomy syndrome, not elsewhere classified: Secondary | ICD-10-CM | POA: Diagnosis not present

## 2024-06-19 DIAGNOSIS — Z79891 Long term (current) use of opiate analgesic: Secondary | ICD-10-CM | POA: Diagnosis not present

## 2024-06-19 MED ORDER — OXYCODONE-ACETAMINOPHEN 10-325 MG PO TABS
1.0000 | ORAL_TABLET | ORAL | 0 refills | Status: AC | PRN
  Filled 2024-07-19: qty 180, 30d supply, fill #0

## 2024-06-19 MED ORDER — OXYCODONE-ACETAMINOPHEN 10-325 MG PO TABS
1.0000 | ORAL_TABLET | ORAL | 0 refills | Status: AC | PRN
  Filled 2024-06-19: qty 180, 30d supply, fill #0

## 2024-06-19 MED ORDER — LUBIPROSTONE 24 MCG PO CAPS
24.0000 ug | ORAL_CAPSULE | Freq: Two times a day (BID) | ORAL | 0 refills | Status: AC
  Filled 2024-06-19: qty 30, 15d supply, fill #0

## 2024-07-17 ENCOUNTER — Other Ambulatory Visit (HOSPITAL_COMMUNITY): Payer: Self-pay

## 2024-07-17 MED ORDER — METHOCARBAMOL 750 MG PO TABS
1500.0000 mg | ORAL_TABLET | Freq: Three times a day (TID) | ORAL | 1 refills | Status: AC
  Filled 2024-07-17: qty 180, 30d supply, fill #0

## 2024-07-17 MED ORDER — LUBIPROSTONE 24 MCG PO CAPS
24.0000 ug | ORAL_CAPSULE | Freq: Two times a day (BID) | ORAL | 2 refills | Status: AC
  Filled 2024-07-17: qty 30, 15d supply, fill #0

## 2024-07-17 MED ORDER — FLUOXETINE HCL 10 MG PO CAPS
10.0000 mg | ORAL_CAPSULE | Freq: Every day | ORAL | 1 refills | Status: AC
  Filled 2024-07-17: qty 10, 10d supply, fill #0
  Filled 2024-07-17: qty 30, 30d supply, fill #0
  Filled 2024-07-17: qty 20, 20d supply, fill #0

## 2024-07-18 ENCOUNTER — Other Ambulatory Visit (HOSPITAL_COMMUNITY): Payer: Self-pay

## 2024-07-19 ENCOUNTER — Other Ambulatory Visit (HOSPITAL_COMMUNITY): Payer: Self-pay

## 2024-07-19 DIAGNOSIS — N2889 Other specified disorders of kidney and ureter: Secondary | ICD-10-CM | POA: Diagnosis not present

## 2024-07-19 DIAGNOSIS — W57XXXA Bitten or stung by nonvenomous insect and other nonvenomous arthropods, initial encounter: Secondary | ICD-10-CM | POA: Diagnosis not present

## 2024-07-19 DIAGNOSIS — E538 Deficiency of other specified B group vitamins: Secondary | ICD-10-CM | POA: Diagnosis not present

## 2024-07-19 DIAGNOSIS — M961 Postlaminectomy syndrome, not elsewhere classified: Secondary | ICD-10-CM | POA: Diagnosis not present

## 2024-07-19 DIAGNOSIS — F331 Major depressive disorder, recurrent, moderate: Secondary | ICD-10-CM | POA: Diagnosis not present

## 2024-08-15 ENCOUNTER — Other Ambulatory Visit (HOSPITAL_COMMUNITY): Payer: Self-pay

## 2024-08-15 ENCOUNTER — Other Ambulatory Visit: Payer: Self-pay

## 2024-08-15 MED ORDER — FLUOXETINE HCL 10 MG PO CAPS
10.0000 mg | ORAL_CAPSULE | Freq: Every day | ORAL | 1 refills | Status: AC
  Filled 2024-08-15: qty 30, 30d supply, fill #0

## 2024-08-15 MED ORDER — OXYCODONE-ACETAMINOPHEN 10-325 MG PO TABS
1.0000 | ORAL_TABLET | ORAL | 0 refills | Status: AC | PRN
  Filled 2024-08-17: qty 180, 30d supply, fill #0

## 2024-08-15 MED ORDER — METHOCARBAMOL 750 MG PO TABS
1500.0000 mg | ORAL_TABLET | Freq: Three times a day (TID) | ORAL | 1 refills | Status: AC
  Filled 2024-08-15: qty 180, 30d supply, fill #0

## 2024-08-16 ENCOUNTER — Other Ambulatory Visit (HOSPITAL_COMMUNITY): Payer: Self-pay

## 2024-08-17 ENCOUNTER — Other Ambulatory Visit (HOSPITAL_COMMUNITY): Payer: Self-pay

## 2024-09-12 ENCOUNTER — Other Ambulatory Visit: Payer: Self-pay

## 2024-09-12 ENCOUNTER — Other Ambulatory Visit (HOSPITAL_COMMUNITY): Payer: Self-pay

## 2024-09-12 MED ORDER — FLUOXETINE HCL 10 MG PO CAPS
10.0000 mg | ORAL_CAPSULE | Freq: Every day | ORAL | 1 refills | Status: AC
  Filled 2024-09-12: qty 30, 30d supply, fill #0

## 2024-09-12 MED ORDER — OXYCODONE-ACETAMINOPHEN 10-325 MG PO TABS: 1.0000 | ORAL_TABLET | ORAL | 0 refills | Status: AC | PRN

## 2024-09-12 MED ORDER — LUBIPROSTONE 24 MCG PO CAPS
24.0000 ug | ORAL_CAPSULE | Freq: Two times a day (BID) | ORAL | 2 refills | Status: AC
  Filled 2024-09-12: qty 30, 15d supply, fill #0

## 2024-09-12 MED ORDER — METHOCARBAMOL 750 MG PO TABS
1500.0000 mg | ORAL_TABLET | Freq: Three times a day (TID) | ORAL | 1 refills | Status: AC
  Filled 2024-09-12: qty 180, 30d supply, fill #0
